# Patient Record
Sex: Female | Born: 1985 | Race: White | Hispanic: No | Smoking: Never smoker
Health system: Southern US, Community
[De-identification: ages and names within clinical notes are randomized; demographics above are authoritative.]

## PROBLEM LIST (undated history)

## (undated) DIAGNOSIS — K219 Gastro-esophageal reflux disease without esophagitis: Secondary | ICD-10-CM

## (undated) DIAGNOSIS — G35 Multiple sclerosis: Secondary | ICD-10-CM

## (undated) HISTORY — DX: Gastro-esophageal reflux disease without esophagitis: K21.9

## (undated) HISTORY — DX: Multiple sclerosis: G35

---

## 2008-02-14 ENCOUNTER — Encounter: Admission: RE | Admit: 2008-02-14 | Discharge: 2008-02-14 | Payer: Self-pay | Admitting: Internal Medicine

## 2008-04-07 HISTORY — PX: WISDOM TOOTH EXTRACTION: SHX21

## 2008-11-07 ENCOUNTER — Emergency Department (HOSPITAL_COMMUNITY): Admission: EM | Admit: 2008-11-07 | Discharge: 2008-11-07 | Payer: Self-pay | Admitting: Family Medicine

## 2010-04-07 HISTORY — PX: TONSILECTOMY/ADENOIDECTOMY WITH MYRINGOTOMY: SHX6125

## 2010-12-22 ENCOUNTER — Inpatient Hospital Stay (INDEPENDENT_AMBULATORY_CARE_PROVIDER_SITE_OTHER)
Admission: RE | Admit: 2010-12-22 | Discharge: 2010-12-22 | Disposition: A | Discharge status: Home / Self Care | Payer: 59 | Source: Ambulatory Visit | Attending: Family Medicine | Admitting: Family Medicine

## 2010-12-22 DIAGNOSIS — J069 Acute upper respiratory infection, unspecified: Secondary | ICD-10-CM

## 2010-12-22 DIAGNOSIS — N390 Urinary tract infection, site not specified: Secondary | ICD-10-CM

## 2010-12-22 LAB — POCT URINALYSIS DIP (DEVICE)
Bilirubin Urine: NEGATIVE
Ketones, ur: NEGATIVE mg/dL
Nitrite: NEGATIVE
Protein, ur: NEGATIVE mg/dL
Urobilinogen, UA: 0.2 mg/dL (ref 0.0–1.0)
pH: 6 (ref 5.0–8.0)

## 2010-12-22 LAB — POCT PREGNANCY, URINE: Preg Test, Ur: NEGATIVE

## 2011-04-03 ENCOUNTER — Ambulatory Visit (INDEPENDENT_AMBULATORY_CARE_PROVIDER_SITE_OTHER): Payer: 59

## 2011-04-03 DIAGNOSIS — J45909 Unspecified asthma, uncomplicated: Secondary | ICD-10-CM

## 2011-04-03 DIAGNOSIS — R5381 Other malaise: Secondary | ICD-10-CM

## 2011-07-08 ENCOUNTER — Ambulatory Visit (INDEPENDENT_AMBULATORY_CARE_PROVIDER_SITE_OTHER): Payer: 59 | Admitting: Physician Assistant

## 2011-07-08 VITALS — BP 128/82 | HR 106 | Temp 99.0°F | Resp 16 | Ht 66.0 in | Wt 117.0 lb

## 2011-07-08 DIAGNOSIS — J45909 Unspecified asthma, uncomplicated: Secondary | ICD-10-CM | POA: Insufficient documentation

## 2011-07-08 DIAGNOSIS — R05 Cough: Secondary | ICD-10-CM

## 2011-07-08 DIAGNOSIS — J019 Acute sinusitis, unspecified: Secondary | ICD-10-CM

## 2011-07-08 MED ORDER — CEFDINIR 300 MG PO CAPS: 300.0000 mg | ORAL_CAPSULE | Freq: Two times a day (BID) | ORAL | Status: AC

## 2011-07-08 MED ORDER — FLUTICASONE PROPIONATE 50 MCG/ACT NA SUSP: 2.0000 | Freq: Every day | NASAL | Status: DC

## 2011-07-08 MED ORDER — HYDROCODONE-HOMATROPINE 5-1.5 MG/5ML PO SYRP: ORAL_SOLUTION | ORAL | Status: AC

## 2011-07-08 NOTE — Progress Notes (Signed)
Patient ID: Penny Hansen MRN: [id number], DOB: Sep 24, 1985, 26 y.o. Date of Encounter: 07/08/2011, 6:55 PM  Primary Physician: No primary provider on file.  Chief Complaint:  Chief Complaint  Patient presents with  . Cough    x 6 days  . Sore Throat  . Sinusitis    HPI: 26 y.o. year old female presents with a 6 day history of worsening nasal congestion, post nasal drip, sore throat, sinus pressure, and cough. Afebrile. No chills. Nasal congestion thick and green/yellow. Sinus pressure is the worst symptom. Cough is productive secondary to post nasal drip and not associated with time of day. She does have a history of asthma, but this has not been an issue with this illness at this point. She denies any chest congestion, SOB, or wheezing. She does not need a refill of her albuterol inhaler today. Her asthma is typically very well controlled not requiring the usage of her albuterol inhaler. Through out this illness she has only required the usage of her albuterol inhaler a couple of times after developing a coughing episode. It was not being used for SOB or wheezing. Ears feel full, leading to sensation of muffled hearing. Has tried OTC cold preps without success. No GI complaints. Appetite normal.  No recent antibiotics, recent travels, or sick contacts   No leg trauma, sedentary periods, h/o cancer, or tobacco use.  Past Medical History  Diagnosis Date  . Asthma      Home Meds: Prior to Admission medications   Medication Sig Start Date End Date Taking? Authorizing Provider  albuterol (PROVENTIL HFA;VENTOLIN HFA) 108 (90 BASE) MCG/ACT inhaler Inhale 2 puffs into the lungs every 6 (six) hours as needed.   Yes Historical Provider, MD  norethindrone-ethinyl estradiol (MICROGESTIN,JUNEL,LOESTRIN) 1-20 MG-MCG tablet Take 1 tablet by mouth daily.   Yes Historical Provider, MD  cefdinir (OMNICEF) 300 MG capsule Take 1 capsule (300 mg total) by mouth 2 (two) times daily. 07/08/11 07/18/11  Keymarion Bearman  M Jensine Luz, PA-C  fluticasone (FLONASE) 50 MCG/ACT nasal spray Place 2 sprays into the nose daily. 07/08/11 07/07/12  Frankie Scipio M Morgaine Kimball, PA-C  HYDROcodone-homatropine (HYCODAN) 5-1.5 MG/5ML syrup 1 TSP PO Q 4-6 HOURS PRN COUGH 07/08/11 07/18/11  Sondra Barges, PA-C    Allergies:  Allergies  Allergen Reactions  . Augmentin Other (See Comments)    Gi upset    History   Social History  . Marital Status: Unknown    Spouse Name: N/A    Number of Children: N/A  . Years of Education: N/A   Occupational History  . Not on file.   Social History Main Topics  . Smoking status: Never Smoker   . Smokeless tobacco: Not on file  . Alcohol Use: Not on file  . Drug Use: Not on file  . Sexually Active: Not on file   Other Topics Concern  . Not on file   Social History Narrative  . No narrative on file     Review of Systems: Constitutional: negative for chills, fever, night sweats or weight changes Cardiovascular: negative for chest pain or palpitations Respiratory: negative for hemoptysis, wheezing, or shortness of breath Abdominal: negative for abdominal pain, nausea, vomiting or diarrhea Dermatological: negative for rash Neurologic: negative for headache   Physical Exam: Blood pressure 128/82, pulse 106, temperature 99 F (37.2 C), temperature source Oral, resp. rate 16, height 5\' 6"  (1.676 m), weight 117 lb (53.071 kg), last menstrual period 06/06/2011., Body mass index is 18.88 kg/(m^2). General: Well developed,  well nourished, in no acute distress. Head: Normocephalic, atraumatic, eyes without discharge, sclera non-icteric, nares are congested. Bilateral auditory canals clear, TM's are without perforation, pearly grey with reflective cone of light bilaterally. Serous effusion bilaterally behind TM's. Maxillary sinus TTP. Oral cavity moist, dentition normal. Posterior pharynx with post nasal drip and mild erythema. No peritonsillar abscess or tonsillar exudate. Neck: Supple. No thyromegaly. Full  ROM. No lymphadenopathy. Lungs: Clear bilaterally to auscultation without wheezes, rales, or rhonchi. Breathing is unlabored.  Heart: RRR with S1 S2. No murmurs, rubs, or gallops appreciated. Msk:  Strength and tone normal for age. Extremities: No clubbing or cyanosis. No edema. Neuro: Alert and oriented X 3. Moves all extremities spontaneously. CNII-XII grossly in tact. Psych:  Responds to questions appropriately with a normal affect.     ASSESSMENT AND PLAN:  26 y.o. year old female with sinusitis -Omnicef 300 mg 1 po bid #20 no RF  -Flonase 2 sprays each nare daily #1 no RF -Hycodan #4oz 1 tsp po q 4-6 hours prn cough no RF SED -Call when needs a refill of her albuterol inhaler -Mucinex -Tylenol/Motrin prn -Rest/fluids -RTC precautions -RTC 3-5 days if no improvement  Elinor Dodge, PA-C 07/08/2011 6:55 PM

## 2011-11-07 ENCOUNTER — Ambulatory Visit (INDEPENDENT_AMBULATORY_CARE_PROVIDER_SITE_OTHER): Payer: 59 | Admitting: Physician Assistant

## 2011-11-07 VITALS — BP 110/62 | HR 91 | Temp 98.4°F | Resp 16 | Ht 66.0 in | Wt 117.8 lb

## 2011-11-07 DIAGNOSIS — R300 Dysuria: Secondary | ICD-10-CM

## 2011-11-07 DIAGNOSIS — N390 Urinary tract infection, site not specified: Secondary | ICD-10-CM

## 2011-11-07 LAB — POCT UA - MICROSCOPIC ONLY
Casts, Ur, LPF, POC: NEGATIVE
Crystals, Ur, HPF, POC: NEGATIVE
Mucus, UA: NEGATIVE
Yeast, UA: NEGATIVE

## 2011-11-07 LAB — POCT URINALYSIS DIPSTICK
Bilirubin, UA: NEGATIVE
Glucose, UA: NEGATIVE
Ketones, UA: NEGATIVE
Nitrite, UA: NEGATIVE
Protein, UA: NEGATIVE
Spec Grav, UA: <=1.005
Urobilinogen, UA: 0.2
pH, UA: 6

## 2011-11-07 MED ORDER — PHENAZOPYRIDINE HCL 200 MG PO TABS: 200.0000 mg | ORAL_TABLET | Freq: Three times a day (TID) | ORAL | Status: AC | PRN

## 2011-11-07 MED ORDER — FLUCONAZOLE 150 MG PO TABS: 150.0000 mg | ORAL_TABLET | Freq: Once | ORAL | Status: AC

## 2011-11-07 MED ORDER — SULFAMETHOXAZOLE-TMP DS 800-160 MG PO TABS: 1.0000 | ORAL_TABLET | Freq: Two times a day (BID) | ORAL | Status: AC

## 2011-11-07 NOTE — Progress Notes (Signed)
  Subjective:    Patient ID: Penny Hansen, female    DOB: 1985-10-18, 26 y.o.   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  Just returned from a 10 hr trip a couple of days ago.  +frequency.  No N/V/D. No abdominal/pelvic pain.  No vaginal discharge.  Its been a while since having a UTI. No f/c.  Denies STI risk factors.  Review of Systems  All other systems reviewed and are negative.       Objective:   Physical Exam  Nursing note and vitals reviewed. Constitutional: She is oriented to person, place, and time. She appears well-developed and well-nourished.  HENT:  Head: Normocephalic and atraumatic.  Cardiovascular: Normal rate, regular rhythm and normal heart sounds.   Pulmonary/Chest: Effort normal and breath sounds normal.  Abdominal:       No CVA tenderness  Neurological: She is alert and oriented to person, place, and time.  Skin: Skin is warm and dry.     Results for orders placed in visit on 11/07/11  POCT UA - MICROSCOPIC ONLY      Component Value Range   WBC, Ur, HPF, POC 10-26     RBC, urine, microscopic 8-12     Bacteria, U Microscopic trace     Mucus, UA negative     Epithelial cells, urine per micros 0-1     Crystals, Ur, HPF, POC negative     Casts, Ur, LPF, POC negative     Yeast, UA negative    POCT URINALYSIS DIPSTICK      Component Value Range   Color, UA yellow     Clarity, UA slightly cloudy     Glucose, UA negative     Bilirubin, UA negative     Ketones, UA negative     Spec Grav, UA <=1.005     Blood, UA large     pH, UA 6.0     Protein, UA negative     Urobilinogen, UA 0.2     Nitrite, UA negative     Leukocytes, UA small (1+)          Assessment & Plan:  UTI-see Rxs.  Increase water intake.  Sending urine for culture.

## 2011-11-10 LAB — URINE CULTURE: Colony Count: >=100000

## 2012-05-18 ENCOUNTER — Ambulatory Visit (INDEPENDENT_AMBULATORY_CARE_PROVIDER_SITE_OTHER): Payer: 59 | Admitting: Physician Assistant

## 2012-05-18 VITALS — BP 112/71 | HR 80 | Temp 98.1°F | Resp 16 | Ht 67.0 in | Wt 115.0 lb

## 2012-05-18 DIAGNOSIS — R300 Dysuria: Secondary | ICD-10-CM

## 2012-05-18 DIAGNOSIS — N390 Urinary tract infection, site not specified: Secondary | ICD-10-CM

## 2012-05-18 LAB — POCT URINALYSIS DIPSTICK
Bilirubin, UA: NEGATIVE
Glucose, UA: NEGATIVE
Ketones, UA: NEGATIVE
Protein, UA: NEGATIVE
Spec Grav, UA: <=1.005
Urobilinogen, UA: 0.2
pH, UA: 5.5

## 2012-05-18 LAB — POCT UA - MICROSCOPIC ONLY
Casts, Ur, LPF, POC: NEGATIVE
Epithelial cells, urine per micros: NEGATIVE
Mucus, UA: NEGATIVE
Yeast, UA: NEGATIVE

## 2012-05-18 MED ORDER — NITROFURANTOIN MONOHYD MACRO 100 MG PO CAPS: 100.0000 mg | ORAL_CAPSULE | Freq: Two times a day (BID) | ORAL | Status: DC

## 2012-05-18 MED ORDER — PHENAZOPYRIDINE HCL 200 MG PO TABS: 200.0000 mg | ORAL_TABLET | Freq: Three times a day (TID) | ORAL | Status: DC | PRN

## 2012-05-18 NOTE — Progress Notes (Signed)
   [address], [place] [address]   Phone [phone]  Subjective:    Patient ID: Penny Hansen, female    DOB: 1985-10-05, 27 y.o.   MRN: [id number]  HPI Pt presents to clinic with 6 hr h/o urinary urgency and frequency with dysuria .  Some abd pressure but no back pain, fevers or chill.  No change in sexual partners.  She works at the hospital in [employer] and knows she does not drink enough water.     Review of Systems  Constitutional: Negative for fever and chills.  Gastrointestinal: Negative for nausea, vomiting and abdominal pain.  Genitourinary: Positive for dysuria, urgency, frequency and hematuria. Negative for vaginal discharge.  Musculoskeletal: Negative for back pain.       Objective:   Physical Exam  Vitals reviewed. Constitutional: She is oriented to person, place, and time. She appears well-developed and well-nourished.  HENT:  Head: Normocephalic and atraumatic.  Right Ear: External ear normal.  Left Ear: External ear normal.  Eyes: Conjunctivae are normal.  Cardiovascular: Normal rate, regular rhythm and normal heart sounds.   Pulmonary/Chest: Effort normal and breath sounds normal.  Abdominal: Soft. Bowel sounds are normal. There is tenderness (mild suprapubic discomfort). There is no CVA tenderness.  Neurological: She is alert and oriented to person, place, and time.  Skin: Skin is warm and dry.  Psychiatric: She has a normal mood and affect. Her behavior is normal. Judgment and thought content normal.    Results for orders placed in visit on 05/18/12  POCT UA - MICROSCOPIC ONLY      Result Value Range   WBC, Ur, HPF, POC 30-40 with small clusters     RBC, urine, microscopic 0-2     Bacteria, U Microscopic trace     Mucus, UA neg     Epithelial cells, urine per micros neg     Crystals, Ur, HPF, POC neg     Casts, Ur, LPF, POC neg     Yeast, UA neg    POCT URINALYSIS DIPSTICK      Result Value Range   Color, UA yellowish pink     Clarity, UA hazy       Glucose, UA neg     Bilirubin, UA neg     Ketones, UA neg     Spec Grav, UA <=1.005     Blood, UA large     pH, UA 5.5     Protein, UA neg     Urobilinogen, UA 0.2     Nitrite, UA neg     Leukocytes, UA moderate (2+)           Assessment & Plan:   1. Dysuria  POCT UA - Microscopic Only   POCT UA - Microscopic Only   POCT urinalysis dipstick  2. Urinary tract infection, site not specified  phenazopyridine (PYRIDIUM) 200 MG tablet   phenazopyridine (PYRIDIUM) 200 MG tablet   Urine culture   nitrofurantoin, macrocrystal-monohydrate, (MACROBID) 100 MG capsule   Push fluids.  Continue cranberry supplements because she feels like they are helping decrease the frequency of her UTI.

## 2012-05-20 LAB — URINE CULTURE: Colony Count: 4000

## 2012-08-05 ENCOUNTER — Ambulatory Visit (INDEPENDENT_AMBULATORY_CARE_PROVIDER_SITE_OTHER): Payer: 59 | Admitting: Physician Assistant

## 2012-08-05 VITALS — BP 98/62 | HR 83 | Temp 98.5°F | Resp 16 | Ht 67.0 in | Wt 115.0 lb

## 2012-08-05 DIAGNOSIS — J069 Acute upper respiratory infection, unspecified: Secondary | ICD-10-CM

## 2012-08-05 NOTE — Progress Notes (Signed)
   [address] [place] [address]   Phone [phone]  Subjective:    Patient ID: Penny Hansen, female    DOB: 1985-05-24, 27 y.o.   MRN: [id number]  HPI Pt presents to clinic with 4 day h/o URI symptoms.  She started with a tickle in her throat and then the cough started and now today she feels slightly congested.  She has little rhinorhea and some PND.  She has tried some OTC meds but they have made her feel bad.  She has some fatigue but just does not feel good.  She has never had problems with allergies in the past.  She is an Engineer, structural in the ED.  She is having no problems with her asthma.  OTC - mucinex - gets her nauseated and decrease, cold preps -not much help Review of Systems  Constitutional: Negative for fever and chills.  HENT: Positive for congestion (worse in the am and pm), sore throat (started with a tickle and then a sore throat), rhinorrhea (yellow) and postnasal drip.   Respiratory: Positive for cough (yellow).   Allergic/Immunologic: Negative for environmental allergies.       Objective:   Physical Exam  Vitals reviewed. Constitutional: She is oriented to person, place, and time. She appears well-developed and well-nourished.  HENT:  Head: Normocephalic and atraumatic.  Right Ear: Hearing, tympanic membrane, external ear and ear canal normal.  Left Ear: Hearing, tympanic membrane, external ear and ear canal normal.  Nose: Mucosal edema (red) present.  Mouth/Throat: Uvula is midline, oropharynx is clear and moist and mucous membranes are normal.  Eyes: Conjunctivae are normal. Pupils are equal, round, and reactive to light.  Neck: Neck supple.  Cardiovascular: Normal rate, regular rhythm and normal heart sounds.   No murmur heard. Pulmonary/Chest: Effort normal and breath sounds normal. She has no wheezes.  Neurological: She is alert and oriented to person, place, and time.  Skin: Skin is warm and dry.  Psychiatric: She has a normal mood and affect. Her  behavior is normal. Judgment and thought content normal.          Assessment & Plan:  Acute upper respiratory infections of unspecified site - pt to use the Flonase she has at home.  She is to take immediate release Mucinex to see if that will decrease her nausea. She will push fluids and call me in 5-7 days if no better.  Benny Lennert PA-C 08/05/2012 4:45 PM

## 2012-08-09 ENCOUNTER — Telehealth: Payer: Self-pay

## 2012-08-09 NOTE — Telephone Encounter (Signed)
PT STATES SARAH SAID IF SHE WASN'T ANY BETTER, SHE WOULD CALL HER SOMETHING IN AND SHE WOULD LIKE HER TO DO SO ALSO IS OUT OF HER ALBUTEROL AND NEED A REFILL ON THAT PLEASE CALL [phone]   [facility]

## 2012-08-10 MED ORDER — DOXYCYCLINE HYCLATE 100 MG PO TABS: 100.0000 mg | ORAL_TABLET | Freq: Two times a day (BID) | ORAL | Status: DC

## 2012-08-10 MED ORDER — ALBUTEROL SULFATE HFA 108 (90 BASE) MCG/ACT IN AERS: 2.0000 | INHALATION_SPRAY | Freq: Four times a day (QID) | RESPIRATORY_TRACT | Status: DC | PRN

## 2012-08-10 NOTE — Telephone Encounter (Signed)
I have sent the inhaler to the pharmacy - I have sent in an abx for her that will cover sinus and lungs.

## 2012-08-11 NOTE — Telephone Encounter (Signed)
Called her to advise.  

## 2012-08-23 ENCOUNTER — Encounter: Payer: Self-pay | Admitting: Emergency Medicine

## 2013-02-10 ENCOUNTER — Other Ambulatory Visit: Payer: Self-pay

## 2013-03-27 ENCOUNTER — Emergency Department (HOSPITAL_COMMUNITY)
Admission: EM | Admit: 2013-03-27 | Discharge: 2013-03-27 | Discharge status: Absent without leave | Payer: Self-pay | Source: Home / Self Care

## 2013-03-27 ENCOUNTER — Emergency Department (HOSPITAL_COMMUNITY)
Admission: EM | Admit: 2013-03-27 | Discharge: 2013-03-27 | Disposition: A | Discharge status: Home / Self Care | Payer: 59 | Source: Home / Self Care | Attending: Family Medicine | Admitting: Family Medicine

## 2013-03-27 ENCOUNTER — Encounter (HOSPITAL_COMMUNITY): Payer: Self-pay | Admitting: Emergency Medicine

## 2013-03-27 DIAGNOSIS — R300 Dysuria: Secondary | ICD-10-CM

## 2013-03-27 LAB — POCT URINALYSIS DIP (DEVICE)
Glucose, UA: 100 mg/dL — AB
Hgb urine dipstick: NEGATIVE
Urobilinogen, UA: 1 mg/dL (ref 0.0–1.0)
pH: 7.5 (ref 5.0–8.0)

## 2013-03-27 MED ORDER — SULFAMETHOXAZOLE-TRIMETHOPRIM 800-160 MG PO TABS: 1.0000 | ORAL_TABLET | Freq: Two times a day (BID) | ORAL | Status: DC

## 2013-03-27 NOTE — ED Notes (Signed)
C/o UTi for a day now States she does have burning when urinating, chills and urinary frequency States she has tried AZO but no relief.

## 2013-03-27 NOTE — ED Provider Notes (Signed)
CSN: [id number]     Arrival date & time 03/27/13  1634 History   First MD Initiated Contact with Patient 03/27/13 1802     Chief Complaint  Patient presents with  . Urinary Tract Infection   (Consider location/radiation/quality/duration/timing/severity/associated sxs/prior Treatment) HPI Comments: 24 hours of dysuria, chills and frequency. No hematuria, flank pain, N/V or abdominal pain. No vaginal bleeding or discharge.   Patient is a 27 y.o. female presenting with urinary tract infection. The history is provided by the patient.  Urinary Tract Infection This is a new problem. The current episode started yesterday. The problem has been gradually worsening.    Past Medical History  Diagnosis Date  . Asthma    Past Surgical History  Procedure Laterality Date  . Tonsilectomy/adenoidectomy with myringotomy    . Wisdom tooth extraction     Family History  Problem Relation Age of Onset  . Ulcerative colitis Mother   . Cancer Paternal Grandmother    History  Substance Use Topics  . Smoking status: Never Smoker   . Smokeless tobacco: Not on file  . Alcohol Use: No   OB History   Grav Para Term Preterm Abortions TAB SAB Ect Mult Living                 Review of Systems  All other systems reviewed and are negative.    Allergies  Amoxicillin-pot clavulanate  Home Medications   Current Outpatient Rx  Name  Route  Sig  Dispense  Refill  . albuterol (PROVENTIL HFA;VENTOLIN HFA) 108 (90 BASE) MCG/ACT inhaler   Inhalation   Inhale 2 puffs into the lungs every 6 (six) hours as needed.   1 Inhaler   0   . Ascorbic Acid (VITAMIN C) 100 MG tablet   Oral   Take 100 mg by mouth daily.         Marland Kitchen doxycycline (VIBRA-TABS) 100 MG tablet   Oral   Take 1 tablet (100 mg total) by mouth 2 (two) times daily.   20 tablet   0   . norethindrone-ethinyl estradiol (MICROGESTIN,JUNEL,LOESTRIN) 1-20 MG-MCG tablet   Oral   Take 1 tablet by mouth daily.          BP 128/72   Pulse 81  Temp(Src) 98 F (36.7 C) (Oral)  Resp 18  SpO2 96%  LMP 03/17/2013 Physical Exam  Nursing note and vitals reviewed. Constitutional: She is oriented to person, place, and time. She appears well-developed and well-nourished. No distress.  HENT:  Head: Normocephalic and atraumatic.  Eyes: No scleral icterus.  Cardiovascular: Normal rate, regular rhythm and normal heart sounds.   Pulmonary/Chest: Effort normal and breath sounds normal.  Abdominal: Soft. Bowel sounds are normal. She exhibits no distension. There is no tenderness.  Musculoskeletal: Normal range of motion.  Neurological: She is alert and oriented to person, place, and time.  Skin: Skin is warm and dry.  Psychiatric: She has a normal mood and affect. Her behavior is normal.    ED Course  Procedures (including critical care time) Labs Review Labs Reviewed - No data to display Imaging Review No results found.  EKG Interpretation    Date/Time:    Ventricular Rate:    PR Interval:    QRS Duration:   QT Interval:    QTC Calculation:   R Axis:     Text Interpretation:              MDM  CBG normal (83). UA only with small  LE, small glucose but no nitrite. Will send for C&S. Advised patient that dipstick test with very limited evidence for UTI, however, based upon patient's clinical symptoms and approaching Christmas Holiday, will prove with 3 day course of Bactrim DS that she may begin if symptoms worse over next few days.   Jess Barters [place], [place] 03/27/13 [phone]

## 2013-03-28 NOTE — ED Provider Notes (Signed)
Medical screening examination/treatment/procedure(s) were performed by a resident physician or non-physician practitioner and as the supervising physician I was immediately available for consultation/collaboration.  Kristofor Michalowski, MD    Hernandez Losasso S Raidyn Wassink, MD 03/28/13 0741 

## 2013-03-29 LAB — URINE CULTURE
Colony Count: 25000
Special Requests: NORMAL

## 2013-03-29 NOTE — ED Notes (Signed)
Urine culture: 25,000 colonies E. Coli.  Pt. adequately treated with Septra DS. Vassie Moselle 03/29/2013

## 2013-04-13 ENCOUNTER — Encounter: Payer: Self-pay | Admitting: Family Medicine

## 2013-04-13 ENCOUNTER — Ambulatory Visit (INDEPENDENT_AMBULATORY_CARE_PROVIDER_SITE_OTHER): Payer: 59 | Admitting: Family Medicine

## 2013-04-13 VITALS — BP 114/79 | HR 96 | Temp 98.5°F | Ht 67.0 in | Wt 124.0 lb

## 2013-04-13 DIAGNOSIS — Z0000 Encounter for general adult medical examination without abnormal findings: Secondary | ICD-10-CM

## 2013-04-13 NOTE — Patient Instructions (Signed)
It was great meeting you today!  Please return to the clinic for a lab appointment only. You can make the appointment by calling a couple of days prior to the day you would like to get your labs drawn.  Please come fasting for the labwork: no food or drink other than water after midnight.  I will send you a letter in the mail with the results of your lab work.

## 2013-04-13 NOTE — Progress Notes (Signed)
Patient ID: Penny Hansen, female   DOB: 02/07/1986, 28 y.o.   MRN: [id number]  Chief Complaint  Patient presents with  . new patient     HPI Penny Hansen is a 28 y.o. female who presents to establish care. She has no complaints or concerns. Past Medical History  Diagnosis Date  . Asthma   . GERD (gastroesophageal reflux disease)     silent GERD which has caused cough in the past    Past Surgical History  Procedure Laterality Date  . Tonsilectomy/adenoidectomy with myringotomy    . Wisdom tooth extraction      Family History  Problem Relation Age of Onset  . Ulcerative colitis Mother   . Cancer Paternal Grandmother [id number]    breast cancer with mastectomy, chemo and radiation  . Diabetes Paternal Grandmother   . Cancer Maternal Grandmother [id number]    breast cancer: lumpectomy  . Heart disease Maternal Grandfather     MI    Social History History  Substance Use Topics  . Smoking status: Never Smoker   . Smokeless tobacco: Not on file  . Alcohol Use: No    Allergies  Allergen Reactions  . Amoxicillin-Pot Clavulanate Other (See Comments)    Gi upset    Current Outpatient Prescriptions  Medication Sig Dispense Refill  . albuterol (PROVENTIL HFA;VENTOLIN HFA) 108 (90 BASE) MCG/ACT inhaler Inhale 2 puffs into the lungs every 6 (six) hours as needed.  1 Inhaler  0  . Ascorbic Acid (VITAMIN C) 100 MG tablet Take 100 mg by mouth daily.      . norethindrone-ethinyl estradiol (MICROGESTIN,JUNEL,LOESTRIN) 1-20 MG-MCG tablet Take 1 tablet by mouth daily.       No current facility-administered medications for this visit.    Review of Systems Review of Systems  Constitutional: Negative for chills, activity change and fatigue.  Respiratory: Negative for cough, shortness of breath and wheezing.   Cardiovascular: Negative for chest pain and leg swelling.  Gastrointestinal: Negative for nausea, abdominal pain, constipation and blood in stool.  Genitourinary: Negative  for frequency.  Musculoskeletal: Negative for arthralgias.  Skin: Negative for color change and rash.  Neurological: Negative for light-headedness and headaches.  Psychiatric/Behavioral: Negative for suicidal ideas, behavioral problems and sleep disturbance.    Blood pressure 114/79, pulse 96, temperature 98.5 F (36.9 C), temperature source Oral, height 5\' 7"  (1.702 m), weight 124 lb (56.246 kg), last menstrual period 03/17/2013.  Physical Exam Physical Exam General appearance - alert, well appearing, and in no distress, pleasant white female Ears - bilateral TM's and external ear canals normal Nose - normal and patent, no erythema, discharge or polyps Mouth - mucous membranes moist, pharynx normal without lesions, tonsils absent Neck - supple, no significant adenopathy Chest - clear to auscultation, no wheezes, rales or rhonchi, symmetric air entry Heart - normal rate, regular rhythm, normal S1, S2, no murmurs Abdomen - soft, nontender, nondistended, no masses or organomegaly Extremities - no edema  Data Reviewed Reported PAP smear results by patient:  2013: [facility] pap with no high risk HPV  Assessment    28 yo female with no significant past medical history who presents to establish care. Doing well.     Plan    Health maintenance:  - will obtain lipid panel, CMP and CBC as baseline labs since this has never been done - repeat PAP smear to be done at her gynecologist's office: [facility]. She was told to have repeat PAP from [facility] PAP without high risk  HPV in 1 year which will be in February 2015.  - up todate on flu shot.   Follow up in 1 year or as needed.      Marena Chancy 04/13/2013, 5:26 PM

## 2013-08-17 ENCOUNTER — Encounter: Payer: Self-pay | Admitting: Family Medicine

## 2013-08-17 ENCOUNTER — Ambulatory Visit (INDEPENDENT_AMBULATORY_CARE_PROVIDER_SITE_OTHER): Payer: 59 | Admitting: Family Medicine

## 2013-08-17 VITALS — BP 121/55 | HR 98 | Temp 99.1°F | Resp 20 | Ht 67.0 in | Wt 120.0 lb

## 2013-08-17 DIAGNOSIS — J329 Chronic sinusitis, unspecified: Secondary | ICD-10-CM

## 2013-08-17 MED ORDER — AZITHROMYCIN 500 MG PO TABS: 500.0000 mg | ORAL_TABLET | Freq: Every day | ORAL | Status: DC

## 2013-08-17 MED ORDER — FLUTICASONE PROPIONATE 50 MCG/ACT NA SUSP
2.0000 | Freq: Every day | NASAL | Status: DC
Start: 2013-08-17 — End: 2014-01-21

## 2013-08-17 NOTE — Progress Notes (Signed)
Family Medicine Office Visit Note   Subjective:   Patient ID: Penny Hansen, female  DOB: [birth year], 28 y.o.. MRN: [id number]   Pt that comes today complaining of respiratory symptoms for more than one week. She reports nasal congestion and discharge as well as changes in her sense of smell. Denies fever, chills, nausea or vomiting. She has history of sick contact with her husband who is also sick with similar symptoms.   Review of Systems:  Per HPI  Objective:   Physical Exam: Gen:  NAD HEENT: Moist mucous membranes. Oropharynx normal without exudates. Nose: no rhinorrhea but hyponasal voice is present. Sinus not tender to palpation.  Ears: normal ear canal and TM with mild clear effusion present bilaterally. R more than left. Neck is supple, no adenopathies.  CV: Regular rate and rhythm, no murmurs rubs or gallops PULM: Clear to auscultation bilaterally. No wheezes/rales/rhonchi EXT: No edema Neuro: Alert and oriented x3. No focalization  Assessment & Plan:

## 2013-08-17 NOTE — Assessment & Plan Note (Signed)
Symptoms for more than 1 week. Will treat with Azithromycin. Ear effusion present without signs of otitis media, most likely from congestion and allergies. Recommend Flonase and oral antihistaminic F/u as needed.

## 2013-08-17 NOTE — Patient Instructions (Signed)

## 2013-11-10 ENCOUNTER — Emergency Department (HOSPITAL_COMMUNITY)
Admission: EM | Admit: 2013-11-10 | Discharge: 2013-11-10 | Disposition: A | Discharge status: Home / Self Care | Payer: 59 | Source: Home / Self Care | Attending: Family Medicine | Admitting: Family Medicine

## 2013-11-10 ENCOUNTER — Encounter (HOSPITAL_COMMUNITY): Payer: Self-pay | Admitting: Emergency Medicine

## 2013-11-10 DIAGNOSIS — J40 Bronchitis, not specified as acute or chronic: Secondary | ICD-10-CM

## 2013-11-10 MED ORDER — IPRATROPIUM BROMIDE 0.06 % NA SOLN: 2.0000 | Freq: Four times a day (QID) | NASAL | Status: DC

## 2013-11-10 MED ORDER — GUAIFENESIN-CODEINE 100-10 MG/5ML PO SOLN: 5.0000 mL | Freq: Every evening | ORAL | Status: DC | PRN

## 2013-11-10 MED ORDER — ALBUTEROL SULFATE HFA 108 (90 BASE) MCG/ACT IN AERS
2.0000 | INHALATION_SPRAY | Freq: Four times a day (QID) | RESPIRATORY_TRACT | Status: DC | PRN
Start: 2013-11-10 — End: 2015-12-27

## 2013-11-10 MED ORDER — PREDNISONE 10 MG PO TABS: 30.0000 mg | ORAL_TABLET | Freq: Every day | ORAL | Status: DC

## 2013-11-10 MED ORDER — AZITHROMYCIN 250 MG PO TABS: 250.0000 mg | ORAL_TABLET | Freq: Every day | ORAL | Status: DC

## 2013-11-10 NOTE — ED Provider Notes (Signed)
Penny Hansen is a 28 y.o. female who presents to Urgent Care today for cough. Patient is a ten-day history of cough that is sometimes productive. The cough is quite profound and can interfere with sleep. She has had a few episodes of posttussive emesis. Albuterol and also help. Her albuterol inhalers expired. She does have a history of asthma. She denies any fevers or chills or diarrhea. No chest pain or palpitations.   Past Medical History  Diagnosis Date  . Asthma   . GERD (gastroesophageal reflux disease)     silent GERD which has caused cough in the past   History  Substance Use Topics  . Smoking status: Never Smoker   . Smokeless tobacco: Not on file  . Alcohol Use: No   ROS as above Medications: No current facility-administered medications for this encounter.   Current Outpatient Prescriptions  Medication Sig Dispense Refill  . norethindrone-ethinyl estradiol (MICROGESTIN,JUNEL,LOESTRIN) 1-20 MG-MCG tablet Take 1 tablet by mouth daily.      Marland Kitchen albuterol (PROVENTIL HFA;VENTOLIN HFA) 108 (90 BASE) MCG/ACT inhaler Inhale 2 puffs into the lungs every 6 (six) hours as needed for wheezing or shortness of breath.  1 Inhaler  2  . Ascorbic Acid (VITAMIN C) 100 MG tablet Take 100 mg by mouth daily.      Marland Kitchen azithromycin (ZITHROMAX) 250 MG tablet Take 1 tablet (250 mg total) by mouth daily. Take first 2 tablets together, then 1 every day until finished.  6 tablet  0  . fluticasone (FLONASE) 50 MCG/ACT nasal spray Place 2 sprays into both nostrils daily.  16 g  2  . guaiFENesin-codeine 100-10 MG/5ML syrup Take 5 mLs by mouth at bedtime as needed for cough.  120 mL  0  . ipratropium (ATROVENT) 0.06 % nasal spray Place 2 sprays into both nostrils 4 (four) times daily.  15 mL  1  . predniSONE (DELTASONE) 10 MG tablet Take 3 tablets (30 mg total) by mouth daily.  15 tablet  0    Exam:  BP 128/88  Pulse 71  Temp(Src) 98.5 F (36.9 C) (Oral)  Resp 12  SpO2 100% Gen: Well NAD HEENT:  EOMI,  MMM posterior pharynx with cobblestoning. Tympanic membranes normal appearing bilaterally. Lungs: Normal work of breathing. CTABL Heart: RRR no MRG Abd: NABS, Soft. Nondistended, Nontender Exts: Brisk capillary refill, warm and well perfused.   No results found for this or any previous visit (from the past 24 hour(s)). No results found.  Assessment and Plan: 28 y.o. female with bronchitis. Plan to treat with prednisone albuterol Atrovent nasal spray and codeine containing cough medication. Azithromycin for use if not improved.  Discussed warning signs or symptoms. Please see discharge instructions. Patient expresses understanding.   This note was created using Conservation officer, historic buildings. Any transcription errors are unintended.    Rodolph Bong, MD 11/10/13 [phone]

## 2013-11-10 NOTE — Discharge Instructions (Signed)
Thank you for coming in today. Call or go to the emergency room if you get worse, have trouble breathing, have chest pains, or palpitations.  Take azithromycin antibiotics if not getting better.    Acute Bronchitis Bronchitis is inflammation of the airways that extend from the windpipe into the lungs (bronchi). The inflammation often causes mucus to develop. This leads to a cough, which is the most common symptom of bronchitis.  In acute bronchitis, the condition usually develops suddenly and goes away over time, usually in a couple weeks. Smoking, allergies, and asthma can make bronchitis worse. Repeated episodes of bronchitis may cause further lung problems.  CAUSES Acute bronchitis is most often caused by the same virus that causes a cold. The virus can spread from person to person (contagious) through coughing, sneezing, and touching contaminated objects. SIGNS AND SYMPTOMS   Cough.   Fever.   Coughing up mucus.   Body aches.   Chest congestion.   Chills.   Shortness of breath.   Sore throat.  DIAGNOSIS  Acute bronchitis is usually diagnosed through a physical exam. Your health care provider will also ask you questions about your medical history. Tests, such as chest X-rays, are sometimes done to rule out other conditions.  TREATMENT  Acute bronchitis usually goes away in a couple weeks. Oftentimes, no medical treatment is necessary. Medicines are sometimes given for relief of fever or cough. Antibiotic medicines are usually not needed but may be prescribed in certain situations. In some cases, an inhaler may be recommended to help reduce shortness of breath and control the cough. A cool mist vaporizer may also be used to help thin bronchial secretions and make it easier to clear the chest.  HOME CARE INSTRUCTIONS  Get plenty of rest.   Drink enough fluids to keep your urine clear or pale yellow (unless you have a medical condition that requires fluid restriction).  Increasing fluids may help thin your respiratory secretions (sputum) and reduce chest congestion, and it will prevent dehydration.   Take medicines only as directed by your health care provider.  If you were prescribed an antibiotic medicine, finish it all even if you start to feel better.  Avoid smoking and secondhand smoke. Exposure to cigarette smoke or irritating chemicals will make bronchitis worse. If you are a smoker, consider using nicotine gum or skin patches to help control withdrawal symptoms. Quitting smoking will help your lungs heal faster.   Reduce the chances of another bout of acute bronchitis by washing your hands frequently, avoiding people with cold symptoms, and trying not to touch your hands to your mouth, nose, or eyes.   Keep all follow-up visits as directed by your health care provider.  SEEK MEDICAL CARE IF: Your symptoms do not improve after 1 week of treatment.  SEEK IMMEDIATE MEDICAL CARE IF:  You develop an increased fever or chills.   You have chest pain.   You have severe shortness of breath.  You have bloody sputum.   You develop dehydration.  You faint or repeatedly feel like you are going to pass out.  You develop repeated vomiting.  You develop a severe headache. MAKE SURE YOU:   Understand these instructions.  Will watch your condition.  Will get help right away if you are not doing well or get worse. Document Released: 05/01/2004 Document Revised: 08/08/2013 Document Reviewed: 09/14/2012 [facility] Patient Information 2015 [place], [place]. This information is not intended to replace advice given to you by your health care provider. Make  sure you discuss any questions you have with your health care provider.

## 2013-11-10 NOTE — ED Notes (Signed)
Pt assessed by provider 

## 2013-12-16 ENCOUNTER — Telehealth: Payer: Self-pay | Admitting: Family Medicine

## 2013-12-16 ENCOUNTER — Ambulatory Visit
Admission: RE | Admit: 2013-12-16 | Discharge: 2013-12-16 | Disposition: A | Discharge status: Home / Self Care | Payer: 59 | Source: Ambulatory Visit | Attending: Family Medicine | Admitting: Family Medicine

## 2013-12-16 ENCOUNTER — Encounter: Payer: Self-pay | Admitting: Family Medicine

## 2013-12-16 ENCOUNTER — Ambulatory Visit (INDEPENDENT_AMBULATORY_CARE_PROVIDER_SITE_OTHER): Payer: 59 | Admitting: Family Medicine

## 2013-12-16 VITALS — BP 120/76 | HR 84 | Temp 99.2°F | Wt 122.0 lb

## 2013-12-16 DIAGNOSIS — R05 Cough: Secondary | ICD-10-CM

## 2013-12-16 DIAGNOSIS — R059 Cough, unspecified: Secondary | ICD-10-CM

## 2013-12-16 DIAGNOSIS — R051 Acute cough: Secondary | ICD-10-CM | POA: Insufficient documentation

## 2013-12-16 IMAGING — CR DG CHEST 2V
2 series · 2 of 2 positions shown · non-contrast
Comparison: None.

CLINICAL DATA: Cough.  Right chest discomfort.  Fatigue.

EXAM:
CHEST  2 VIEW

[w chest pa]
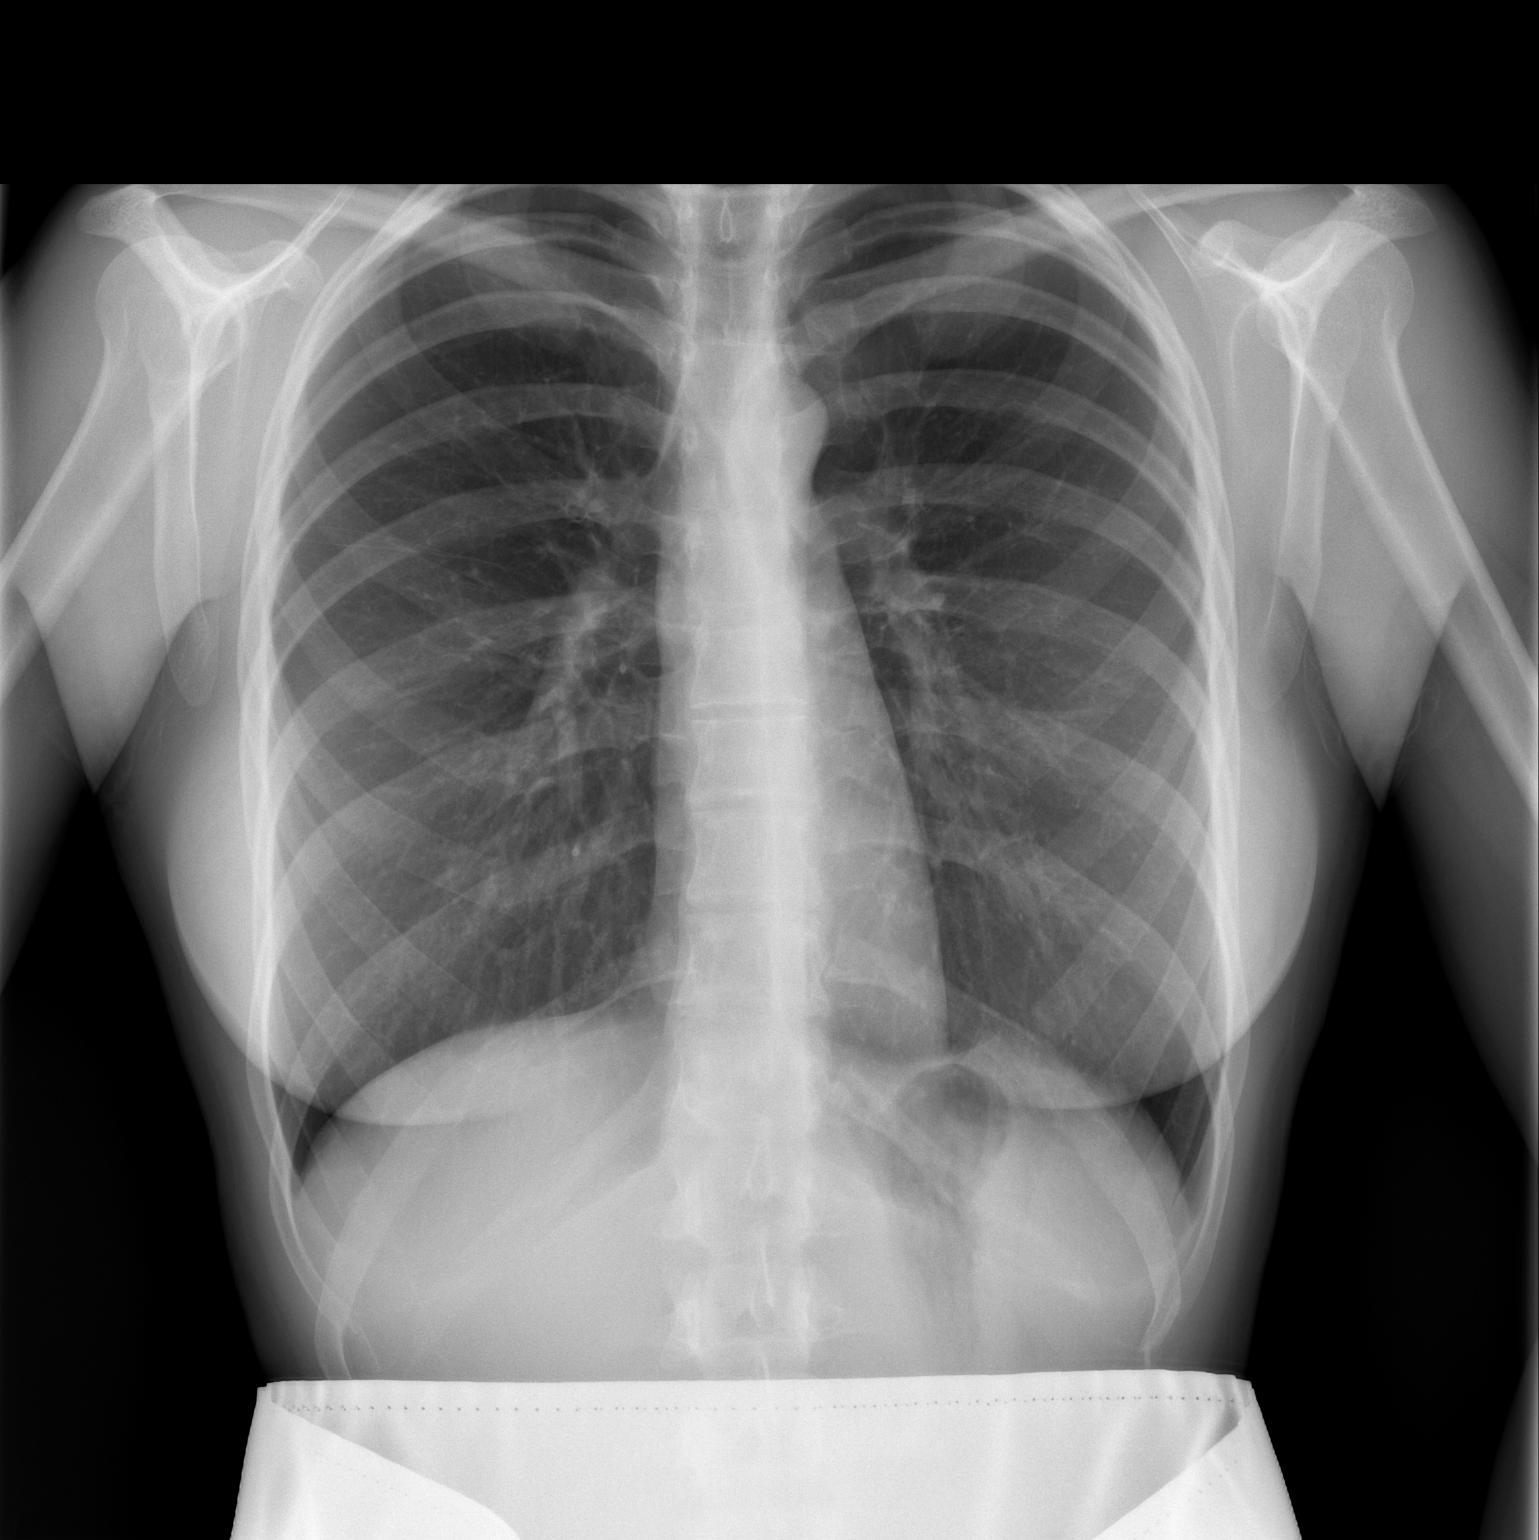

[w chest lat]
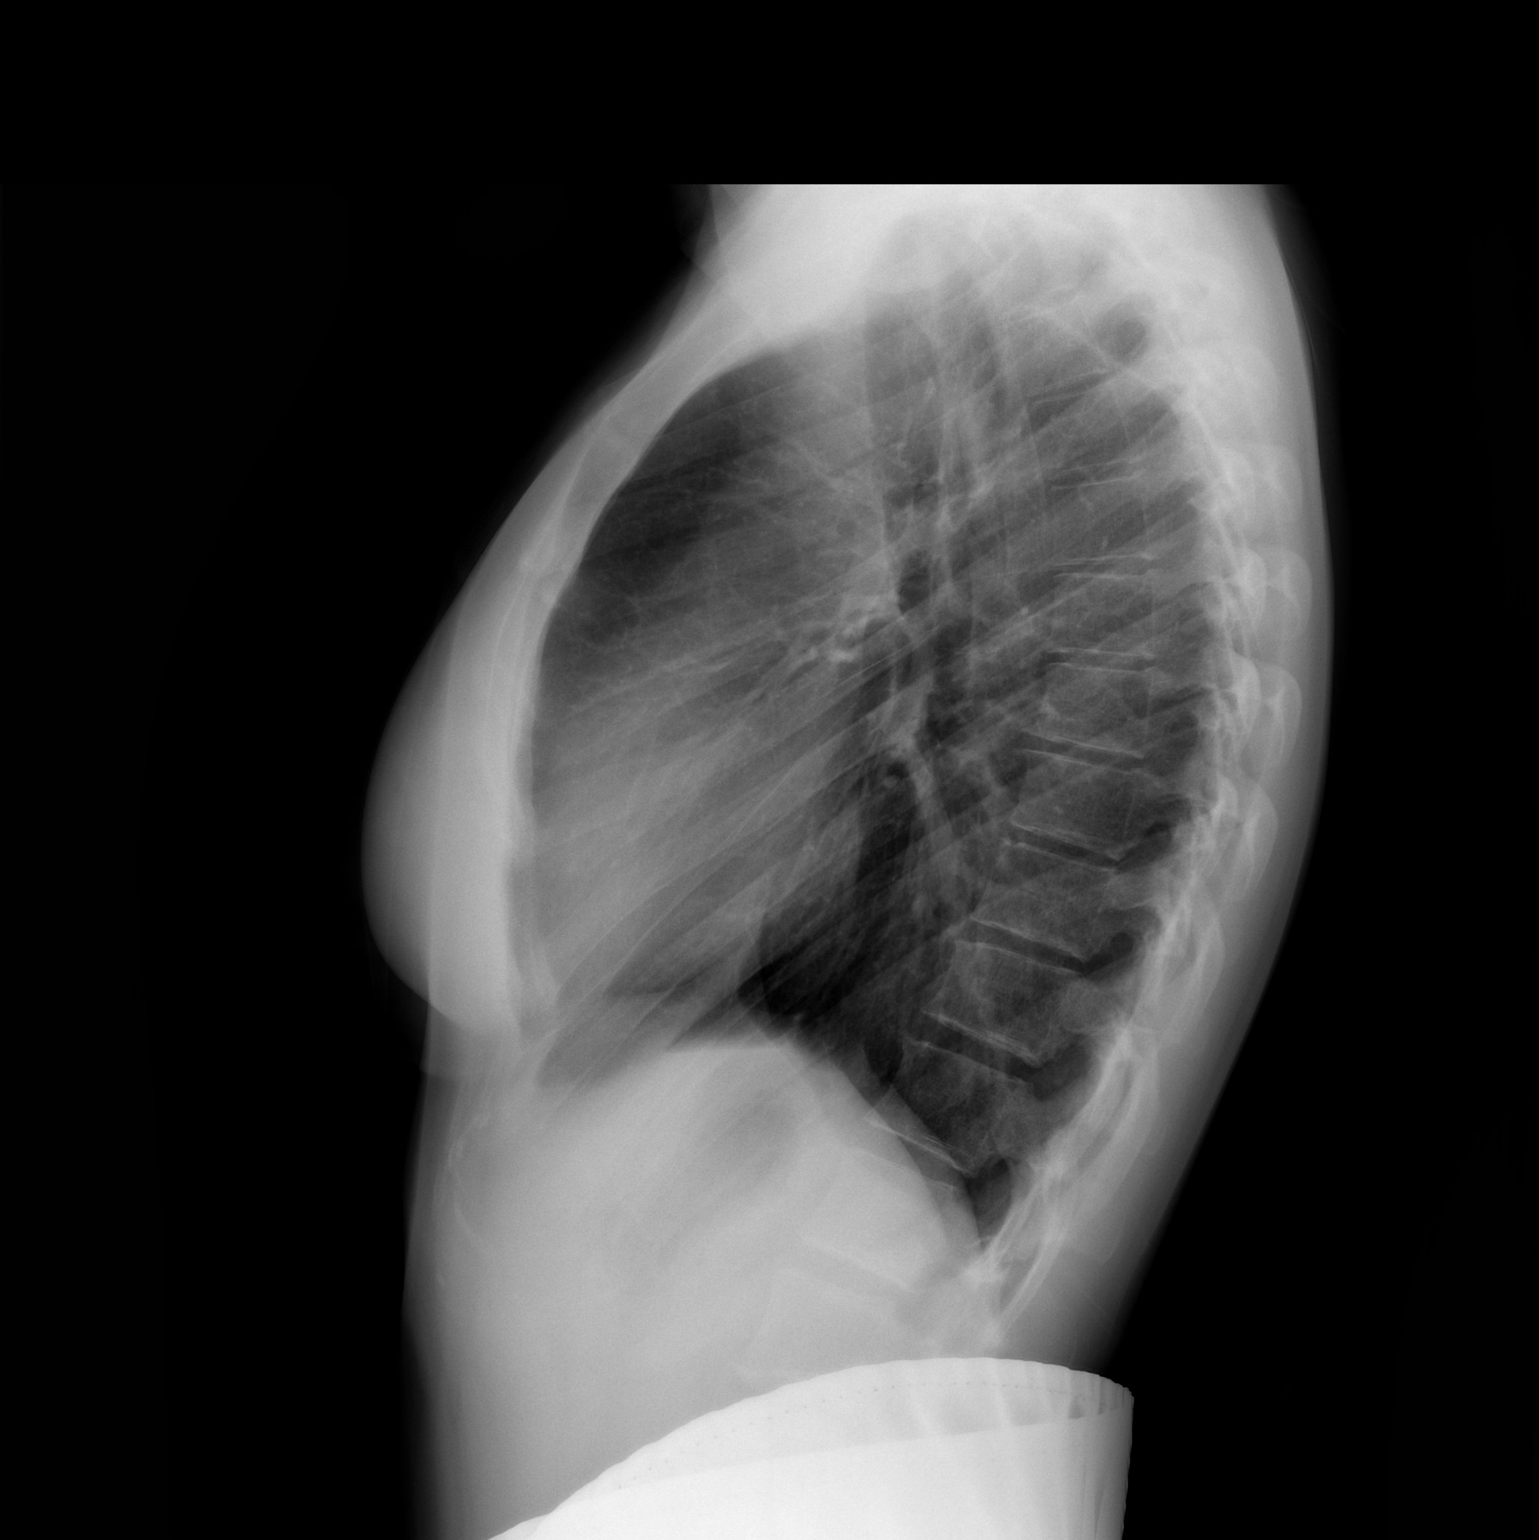

[2 of 2 positions shown; findings below may reference images not displayed]

FINDINGS: The heart size and mediastinal contours are within normal limits.
Both lungs are clear. The visualized skeletal structures are
unremarkable.
IMPRESSION: No active cardiopulmonary disease.

## 2013-12-16 MED ORDER — AZITHROMYCIN 250 MG PO TABS: ORAL_TABLET | ORAL | Status: DC

## 2013-12-16 MED ORDER — BENZONATATE 100 MG PO CAPS: 100.0000 mg | ORAL_CAPSULE | Freq: Two times a day (BID) | ORAL | Status: DC | PRN

## 2013-12-16 NOTE — Patient Instructions (Signed)
Please go to [facility] imaging in [place] and have your x-ray completed as soon as possible. We will call you back with the results and discuss treatment needed at that time I have called in [facility] we discussed today for your cough.

## 2013-12-16 NOTE — Progress Notes (Signed)
   Subjective:    Patient ID: Penny Hansen, female    DOB: 07-Jan-1986, 28 y.o.   MRN: [id number]  HPI Penny Hansen is a 28 y.o. female presents to same-day clinic  Cough: Patient was seen in urgent care approximately 3 weeks ago for similar symptoms of cough congestion. She states now she has concerns because she is feeling a little discomfort in her right thoracic region with inhalation. Patient was seen in urgent care and given a Z-Pak and prednisone which she completed. She states she initially felt better but has since had continued congestion and cough with an odd smell. She has noticed increasing fatigue, but has been extremely busy at work. She denies fevers or chills, nausea or vomiting or difficulty eating. She's currently not taking any medications.   Past Medical History  Diagnosis Date  . Asthma   . GERD (gastroesophageal reflux disease)     silent GERD which has caused cough in the past   Review of Systems Per history of present illness    Objective:   Physical Exam BP 120/76  Pulse 84  Temp(Src) 99.2 F (37.3 C) (Oral)  Wt 122 lb (55.339 kg) Gen: Pleasant, Caucasian female in no acute distress, nontoxic appearance, well-developed, well-nourished. HEENT: AT. [place]. Bilateral TM visualized and normal in appearance. Bilateral eyes without injections or icterus. MMM. Bilateral nares with mild erythema. Throat with mild erythema or exudates.  CV: RRR no murmur Chest: CTAB, no wheeze or crackles     Assessment & Plan:

## 2013-12-16 NOTE — Telephone Encounter (Signed)
Please call Penny Hansen and explained to her her chest x-ray was completely normal. No concerns at all for pneumonia of any type. I have called in an additional Z-Pak for her upper respiratory symptoms, just to cover her. If she is not improving I encouraged her to fill that prescription. Otherwise there are Tessalon Perles called in for her cough. She is to drink plenty of fluids, and followup with PCP in one to 2 weeks.

## 2013-12-16 NOTE — Assessment & Plan Note (Signed)
Small concern for aspiration pneumonia. Patient with history of smell with sputum after cough. Chest x-ray today, will call with results in prescribe antibiotics if appropriate Tessalon Perles prescribed today for cough Followup in 2 weeks, or sooner if no improvement

## 2013-12-19 NOTE — Telephone Encounter (Signed)
Attempted to call patient phone rang and then got busy signal

## 2013-12-20 NOTE — Telephone Encounter (Signed)
Busy signal

## 2014-01-03 ENCOUNTER — Telehealth: Payer: Self-pay | Admitting: Family Medicine

## 2014-01-03 ENCOUNTER — Other Ambulatory Visit: Payer: Self-pay | Admitting: Family Medicine

## 2014-01-03 NOTE — Telephone Encounter (Signed)
Pt called, was told that if cough did not get better that Dr. Claiborne Billings would give her an additional refill of Z-pak. She is leaving for [place], [place] bc her sister is getting married. She would like the RX to sent to   [facility] [address] [facility] [phone]  Pls call pt once RX has been sent.

## 2014-01-03 NOTE — Telephone Encounter (Signed)
Please call pt and explain she will need to come in to be rechecked. If she is infected, then we will need to evaluate her and possibly prescribed a different medication for her. Thanks.

## 2014-01-04 NOTE — Telephone Encounter (Signed)
Pt informed that she will need to schedule an appt for a recheck and possibly a different type of medication.  Pt is aware that the Z-pac refill was denied.  Pt is still having a cough with yellow sputum.  Pt advised she should schedule an appt; is out of town for a wedding.  Pt stated she will give clinic a call when she returns if she is still coughing.  Pt advised to go to an urgent care near by if symptoms worsen.  Pt stated understanding.  Clovis PuMartin, Xaviera Flaten L, RN

## 2014-01-21 ENCOUNTER — Encounter (HOSPITAL_COMMUNITY): Payer: Self-pay | Admitting: Emergency Medicine

## 2014-01-21 ENCOUNTER — Emergency Department (HOSPITAL_COMMUNITY): Payer: PRIVATE HEALTH INSURANCE

## 2014-01-21 ENCOUNTER — Emergency Department (HOSPITAL_COMMUNITY)
Admission: EM | Admit: 2014-01-21 | Discharge: 2014-01-22 | Disposition: A | Discharge status: Home / Self Care | Payer: PRIVATE HEALTH INSURANCE | Attending: Emergency Medicine | Admitting: Emergency Medicine

## 2014-01-21 DIAGNOSIS — S9031XA Contusion of right foot, initial encounter: Secondary | ICD-10-CM | POA: Diagnosis not present

## 2014-01-21 DIAGNOSIS — Z880 Allergy status to penicillin: Secondary | ICD-10-CM | POA: Diagnosis not present

## 2014-01-21 DIAGNOSIS — J45909 Unspecified asthma, uncomplicated: Secondary | ICD-10-CM | POA: Diagnosis not present

## 2014-01-21 DIAGNOSIS — S99921A Unspecified injury of right foot, initial encounter: Secondary | ICD-10-CM | POA: Diagnosis present

## 2014-01-21 DIAGNOSIS — Z8719 Personal history of other diseases of the digestive system: Secondary | ICD-10-CM | POA: Insufficient documentation

## 2014-01-21 DIAGNOSIS — Z79899 Other long term (current) drug therapy: Secondary | ICD-10-CM | POA: Diagnosis not present

## 2014-01-21 DIAGNOSIS — W228XXA Striking against or struck by other objects, initial encounter: Secondary | ICD-10-CM | POA: Insufficient documentation

## 2014-01-21 MED ORDER — IBUPROFEN 800 MG PO TABS: 800.0000 mg | ORAL_TABLET | Freq: Three times a day (TID) | ORAL | Status: DC

## 2014-01-21 NOTE — ED Notes (Addendum)
Pt states she rolled a heavy piece of equipment up against the back of her R foot. C/o pain to achielles tendon area of R foot. Increased pain with flexion and ambulation. Took Motrin 600 mg at 1930 and eased pain a bit. Rates pain 5/10.

## 2014-01-21 NOTE — Discharge Instructions (Signed)
Cryotherapy °Cryotherapy means treatment with cold. Ice or gel packs can be used to reduce both pain and swelling. Ice is the most helpful within the first 24 to 48 hours after an injury or flare-up from overusing a muscle or joint. Sprains, strains, spasms, burning pain, shooting pain, and aches can all be eased with ice. Ice can also be used when recovering from surgery. Ice is effective, has very few side effects, and is safe for most people to use. °PRECAUTIONS  °Ice is not a safe treatment option for people with: °· Raynaud phenomenon. This is a condition affecting small blood vessels in the extremities. Exposure to cold may cause your problems to return. °· Cold hypersensitivity. There are many forms of cold hypersensitivity, including: °¨ Cold urticaria. Red, itchy hives appear on the skin when the tissues begin to warm after being iced. °¨ Cold erythema. This is a red, itchy rash caused by exposure to cold. °¨ Cold hemoglobinuria. Red blood cells break down when the tissues begin to warm after being iced. The hemoglobin that carry oxygen are passed into the urine because they cannot combine with blood proteins fast enough. °· Numbness or altered sensitivity in the area being iced. °If you have any of the following conditions, do not use ice until you have discussed cryotherapy with your caregiver: °· Heart conditions, such as arrhythmia, angina, or chronic heart disease. °· High blood pressure. °· Healing wounds or open skin in the area being iced. °· Current infections. °· Rheumatoid arthritis. °· Poor circulation. °· Diabetes. °Ice slows the blood flow in the region it is applied. This is beneficial when trying to stop inflamed tissues from spreading irritating chemicals to surrounding tissues. However, if you expose your skin to cold temperatures for too long or without the proper protection, you can damage your skin or nerves. Watch for signs of skin damage due to cold. °HOME CARE INSTRUCTIONS °Follow  these tips to use ice and cold packs safely. °· Place a dry or damp towel between the ice and skin. A damp towel will cool the skin more quickly, so you may need to shorten the time that the ice is used. °· For a more rapid response, add gentle compression to the ice. °· Ice for no more than 10 to 20 minutes at a time. The bonier the area you are icing, the less time it will take to get the benefits of ice. °· Check your skin after 5 minutes to make sure there are no signs of a poor response to cold or skin damage. °· Rest 20 minutes or more between uses. °· Once your skin is numb, you can end your treatment. You can test numbness by very lightly touching your skin. The touch should be so light that you do not see the skin dimple from the pressure of your fingertip. When using ice, most people will feel these normal sensations in this order: cold, burning, aching, and numbness. °· Do not use ice on someone who cannot communicate their responses to pain, such as small children or people with dementia. °HOW TO MAKE AN ICE PACK °Ice packs are the most common way to use ice therapy. Other methods include ice massage, ice baths, and cryosprays. Muscle creams that cause a cold, tingly feeling do not offer the same benefits that ice offers and should not be used as a substitute unless recommended by your caregiver. °To make an ice pack, do one of the following: °· Place crushed ice or a   bag of frozen vegetables in a sealable plastic bag. Squeeze out the excess air. Place this bag inside another plastic bag. Slide the bag into a pillowcase or place a damp towel between your skin and the bag. °· Mix 3 parts water with 1 part rubbing alcohol. Freeze the mixture in a sealable plastic bag. When you remove the mixture from the freezer, it will be slushy. Squeeze out the excess air. Place this bag inside another plastic bag. Slide the bag into a pillowcase or place a damp towel between your skin and the bag. °SEEK MEDICAL CARE  IF: °· You develop white spots on your skin. This may give the skin a blotchy (mottled) appearance. °· Your skin turns blue or pale. °· Your skin becomes waxy or hard. °· Your swelling gets worse. °MAKE SURE YOU:  °· Understand these instructions. °· Will watch your condition. °· Will get help right away if you are not doing well or get worse. °Document Released: 11/18/2010 Document Revised: 08/08/2013 Document Reviewed: 11/18/2010 °[employer]® Patient Information ©2015 [employer]. This information is not intended to replace advice given to you by your health care provider. Make sure you discuss any questions you have with your health care provider. ° °

## 2014-01-21 NOTE — ED Provider Notes (Signed)
CSN: [id number]     Arrival date & time 01/21/14  2203 History   First MD Initiated Contact with Patient 01/21/14 2223     Chief Complaint  Patient presents with  . Foot Injury     (Consider location/radiation/quality/duration/timing/severity/associated sxs/prior Treatment) Patient is a 28 y.o. female presenting with foot injury. The history is provided by the patient. No language interpreter was used.  Foot Injury Location:  Ankle Ankle location:  R ankle Pain details:    Severity:  Moderate Associated symptoms: no fever   Associated symptoms comment:  Pain and bruising to posterior right heel after a piece of machinery rolled up onto the ankle. She reports worsening pain over time since the accident which occurred earlier this evening. No calf pain.    Past Medical History  Diagnosis Date  . Asthma   . GERD (gastroesophageal reflux disease)     silent GERD which has caused cough in the past   Past Surgical History  Procedure Laterality Date  . Tonsilectomy/adenoidectomy with myringotomy    . Wisdom tooth extraction     Family History  Problem Relation Age of Onset  . Ulcerative colitis Mother   . Cancer Paternal Grandmother 655    breast cancer with mastectomy, chemo and radiation  . Diabetes Paternal Grandmother   . Cancer Maternal Grandmother [id number]    breast cancer: lumpectomy  . Heart disease Maternal Grandfather     MI   History  Substance Use Topics  . Smoking status: Never Smoker   . Smokeless tobacco: Not on file  . Alcohol Use: No   OB History   Grav Para Term Preterm Abortions TAB SAB Ect Mult Living                 Review of Systems  Constitutional: Negative for fever and chills.  Musculoskeletal:       See HPI.  Skin: Positive for color change. Negative for wound.  Neurological: Negative.  Negative for numbness.      Allergies  Amoxicillin-pot clavulanate  Home Medications   Prior to Admission medications   Medication Sig Start Date End  Date Taking? Authorizing Provider  norethindrone-ethinyl estradiol (MICROGESTIN,JUNEL,LOESTRIN) 1-20 MG-MCG tablet Take 1 tablet by mouth daily.   Yes Historical Provider, MD  albuterol (PROVENTIL HFA;VENTOLIN HFA) 108 (90 BASE) MCG/ACT inhaler Inhale 2 puffs into the lungs every 6 (six) hours as needed for wheezing or shortness of breath. 11/10/13   Rodolph BongEvan S Corey, MD   BP 118/82  Pulse 78  Temp(Src) 98.5 F (36.9 C) (Oral)  Resp 16  SpO2 100%  LMP 01/18/2014 Physical Exam  Constitutional: She is oriented to person, place, and time. She appears well-developed and well-nourished.  Neck: Normal range of motion.  Cardiovascular: Intact distal pulses.   Pulmonary/Chest: Effort normal.  Musculoskeletal:  Right posterior heel ecchymotic without bony deformity. Ankle joint stable. Achilles tendon intact. No lateral or medial ankle or distal foot tenderness.  Neurological: She is alert and oriented to person, place, and time.  Skin: Skin is warm and dry.    ED Course  Procedures (including critical care time) Labs Review Labs Reviewed - No data to display  Imaging Review No results found.   EKG Interpretation None     Dg Foot Complete Right  01/21/2014   CLINICAL DATA:  Right foot injury tonight,pulled medical equipment NG right foot tonight swelling bruising medially and laterally, initial evaluation  EXAM: RIGHT FOOT COMPLETE - 3+ VIEW  COMPARISON:  None.  FINDINGS: There is no evidence of fracture or dislocation. There is no evidence of arthropathy or other focal bone abnormality. Soft tissues are unremarkable.  IMPRESSION: Negative.   Electronically Signed   By: Esperanza Heir M.D.   On: 01/21/2014 23:25   MDM   Final diagnoses:  None    1. Contusion right foot  No tendon injury. No bony injury/fracture on imaging. She states she feels able to walk and declines need for crutches. Discussed ibuprofen, PCP follow up prn.    Arnoldo Hooker, PA-C 01/22/14 [phone]

## 2014-01-22 NOTE — ED Provider Notes (Signed)
Medical screening examination/treatment/procedure(s) were performed by non-physician practitioner and as supervising physician I was immediately available for consultation/collaboration.  Toy CookeyMegan Docherty, MD 01/22/14 [phone]

## 2014-10-10 ENCOUNTER — Ambulatory Visit (INDEPENDENT_AMBULATORY_CARE_PROVIDER_SITE_OTHER): Payer: PRIVATE HEALTH INSURANCE | Admitting: Family Medicine

## 2014-10-10 VITALS — BP 108/62 | HR 83 | Temp 98.6°F | Resp 17 | Ht 68.0 in | Wt 126.2 lb

## 2014-10-10 DIAGNOSIS — J029 Acute pharyngitis, unspecified: Secondary | ICD-10-CM | POA: Diagnosis not present

## 2014-10-10 LAB — POCT RAPID STREP A (OFFICE): RAPID STREP A SCREEN: NEGATIVE

## 2014-10-10 MED ORDER — PSEUDOEPHEDRINE-GUAIFENESIN ER 120-1200 MG PO TB12: 1.0000 | ORAL_TABLET | Freq: Every morning | ORAL | Status: DC

## 2014-10-10 MED ORDER — HYDROCOD POLST-CPM POLST ER 10-8 MG/5ML PO SUER: 5.0000 mL | Freq: Every evening | ORAL | Status: DC | PRN

## 2014-10-10 MED ORDER — ALBUTEROL SULFATE HFA 108 (90 BASE) MCG/ACT IN AERS: 2.0000 | INHALATION_SPRAY | RESPIRATORY_TRACT | Status: DC | PRN

## 2014-10-10 MED ORDER — CEFDINIR 300 MG PO CAPS: 600.0000 mg | ORAL_CAPSULE | Freq: Every day | ORAL | Status: DC

## 2014-10-10 NOTE — Patient Instructions (Signed)

## 2014-10-10 NOTE — Progress Notes (Signed)
Subjective:    Patient ID: Penny Hansen, female    DOB: [birth year], 29 y.o.   MRN: [id number]  This chart was scribed for Norberto Sorenson, MD by Littie Deeds, Medical Scribe. This patient was seen in Room 2 and the patient's care was started at 10:04 AM.    Chief Complaint  Patient presents with  . Sore Throat    started friday   . Ear Pain    left ear  . Cough    dry cough     HPI HPI Comments: Penny Hansen is a 29 y.o. female with a hx of asthma who presents to the [facility] complaining of gradual onset sore throat that started 4 days ago. Patient also reports having left ear pain, dry cough, rhinorrhea with yellow discharge, generalized myalgias, and swollen cervical lymph nodes. The ear pain is worsened with yawning and swallowing. She has been taking Sudafed, Alka Seltzer Cough & Cold, and Mucinex D with some relief to her symptoms. Patient denies fever, wheezing and SOB. She has not had any problems with her breathing.  She has been eating, drinking, urinating and making bowel movements without any problems. She did call out of work today due to her symptoms. Patient works at [employer]. Her husband has been sick starting last week with cough and chest congestion; he was diagnosed with conjunctivitis and was told his upper respiratory symptoms were viral. Her PSHx includes tonsillectomy. Patient notes that she has some GI issues with Augmentin and would prefer another antibiotic.  Past Medical History  Diagnosis Date  . Asthma   . GERD (gastroesophageal reflux disease)     silent GERD which has caused cough in the past   Current Outpatient Prescriptions on File Prior to Visit  Medication Sig Dispense Refill  . albuterol (PROVENTIL HFA;VENTOLIN HFA) 108 (90 BASE) MCG/ACT inhaler Inhale 2 puffs into the lungs every 6 (six) hours as needed for wheezing or shortness of breath. 1 Inhaler 2  . norethindrone-ethinyl estradiol  (MICROGESTIN,JUNEL,LOESTRIN) 1-20 MG-MCG tablet Take 1 tablet by mouth daily.    Marland Kitchen ibuprofen (ADVIL,MOTRIN) 800 MG tablet Take 1 tablet (800 mg total) by mouth 3 (three) times daily. (Patient not taking: Reported on 10/10/2014) 21 tablet 0   No current facility-administered medications on file prior to visit.   Allergies  Allergen Reactions  . Amoxicillin-Pot Clavulanate Other (See Comments)    Gi upset     Review of Systems  Constitutional: Positive for appetite change. Negative for fever, chills, diaphoresis and fatigue.  HENT: Positive for congestion, ear pain, postnasal drip, rhinorrhea, sore throat and trouble swallowing. Negative for ear discharge, mouth sores, nosebleeds, sinus pressure, sneezing and voice change.   Eyes: Negative for pain and itching.  Respiratory: Positive for cough. Negative for apnea, chest tightness, shortness of breath and wheezing.   Cardiovascular: Negative for chest pain.  Gastrointestinal: Negative for nausea, vomiting, abdominal pain, diarrhea and constipation.  Genitourinary: Negative for dysuria.  Musculoskeletal: Positive for myalgias and neck pain. Negative for joint swelling, arthralgias, gait problem and neck stiffness.  Neurological: Negative for dizziness, syncope and headaches.  Hematological: Positive for adenopathy.  Psychiatric/Behavioral: Positive for sleep disturbance.       Objective:  BP 108/62 mmHg  Pulse 83  Temp(Src) 98.6 F (37 C) (Oral)  Resp 17  Ht 5\' 8"  (1.727 m)  Wt 126 lb 3.2 oz (57.244 kg)  BMI 19.19 kg/m2  SpO2 99%  LMP 09/27/2014  Physical Exam  Constitutional: She is oriented to person, place, and time. She appears well-developed and well-nourished. No distress.  HENT:  Head: Normocephalic and atraumatic.  Right Ear: Tympanic membrane normal.  Left Ear: Tympanic membrane normal.  Nose: Rhinorrhea present.  Mouth/Throat: Posterior oropharyngeal erythema present. No oropharyngeal exudate.  Nasal mucosal  erythema. Purulent rhinorrhea. Oropharynx with severe erythema and postnasal drip with some petechiae and small ulcerations.  Eyes: Pupils are equal, round, and reactive to light.  Neck: Neck supple. No thyromegaly present.  Thyroid normal. Anterior cervical adenopathy bilaterally.  Cardiovascular: Normal rate, S1 normal, S2 normal and normal heart sounds.   No murmur heard. Pulmonary/Chest: Effort normal and breath sounds normal. No respiratory distress. She has no wheezes. She has no rales.  Clear to auscultation bilaterally. Excellent air movement throughout.  Musculoskeletal: She exhibits no edema.  Lymphadenopathy:       Head (right side): No preauricular and no posterior auricular adenopathy present.       Head (left side): No preauricular and no posterior auricular adenopathy present.    She has cervical adenopathy.       Right cervical: No posterior cervical adenopathy present.      Left cervical: No posterior cervical adenopathy present.       Right: No supraclavicular adenopathy present.       Left: No supraclavicular adenopathy present.  Neurological: She is alert and oriented to person, place, and time. No cranial nerve deficit.  Skin: Skin is warm. No rash noted. She is diaphoretic.  Psychiatric: She has a normal mood and affect. Her behavior is normal.  Vitals reviewed.  Results for orders placed or performed in visit on 10/10/14  Culture, Group A Strep  Result Value Ref Range   Organism ID, Bacteria Normal Upper Respiratory Flora    Organism ID, Bacteria No Beta Hemolytic Streptococci Isolated   POCT rapid strep A  Result Value Ref Range   Rapid Strep A Screen Negative Negative       Assessment & Plan:   1. Acute pharyngitis, unspecified pharyngitis type     Orders Placed This Encounter  Procedures  . Culture, Group A Strep  . POCT rapid strep A    Meds ordered this encounter  Medications  . cefdinir (OMNICEF) 300 MG capsule    Sig: Take 2 capsules (600  mg total) by mouth daily.    Dispense:  20 capsule    Refill:  0  . chlorpheniramine-HYDROcodone (TUSSIONEX PENNKINETIC ER) 10-8 MG/5ML SUER    Sig: Take 5 mLs by mouth at bedtime as needed for cough.    Dispense:  60 mL    Refill:  0  . Pseudoephedrine-Guaifenesin (MUCINEX D) [phone] MG TB12    Sig: Take 1 tablet by mouth every morning.    Dispense:  20 each    Refill:  0  . albuterol (PROVENTIL HFA;VENTOLIN HFA) 108 (90 BASE) MCG/ACT inhaler    Sig: Inhale 2 puffs into the lungs every 4 (four) hours as needed for wheezing or shortness of breath (cough, shortness of breath or wheezing.).    Dispense:  1 Inhaler    Refill:  1    I personally performed the services described in this documentation, which was scribed in my presence. The recorded information has been reviewed and considered, and addended by me as needed.  Norberto Sorenson, MD MPH

## 2014-10-11 LAB — CULTURE, GROUP A STREP: Organism ID, Bacteria: NORMAL

## 2014-10-16 ENCOUNTER — Encounter: Payer: Self-pay | Admitting: Family Medicine

## 2014-10-17 ENCOUNTER — Encounter: Payer: Self-pay | Admitting: Family Medicine

## 2014-10-23 ENCOUNTER — Ambulatory Visit (INDEPENDENT_AMBULATORY_CARE_PROVIDER_SITE_OTHER): Payer: PRIVATE HEALTH INSURANCE | Admitting: Family Medicine

## 2014-10-23 VITALS — BP 108/66 | HR 85 | Temp 98.4°F | Resp 16 | Ht 67.0 in | Wt 126.0 lb

## 2014-10-23 DIAGNOSIS — J029 Acute pharyngitis, unspecified: Secondary | ICD-10-CM

## 2014-10-23 DIAGNOSIS — R1012 Left upper quadrant pain: Secondary | ICD-10-CM

## 2014-10-23 DIAGNOSIS — J4521 Mild intermittent asthma with (acute) exacerbation: Secondary | ICD-10-CM | POA: Diagnosis not present

## 2014-10-23 LAB — POCT CBC
Granulocyte percent: 62 %G (ref 37–80)
HEMATOCRIT: 38.2 % (ref 37.7–47.9)
Hemoglobin: 13.2 g/dL (ref 12.2–16.2)
Lymph, poc: 2.7 (ref 0.6–3.4)
MCH, POC: 30.7 pg (ref 27–31.2)
MCHC: 34.5 g/dL (ref 31.8–35.4)
MCV: 88.9 fL (ref 80–97)
MID (cbc): 0.1 (ref 0–0.9)
MPV: 6.4 fL (ref 0–99.8)
POC Granulocyte: 4.5 (ref 2–6.9)
POC LYMPH PERCENT: 36.4 %L (ref 10–50)
POC MID %: 1.6 % (ref 0–12)
Platelet Count, POC: 338 10*3/uL (ref 142–424)
RBC: 4.3 M/uL (ref 4.04–5.48)
RDW, POC: 12.2 %
WBC: 7.3 10*3/uL (ref 4.6–10.2)

## 2014-10-23 LAB — POCT RAPID STREP A (OFFICE): Rapid Strep A Screen: NEGATIVE

## 2014-10-23 MED ORDER — PROMETHAZINE HCL 25 MG PO TABS: 25.0000 mg | ORAL_TABLET | Freq: Three times a day (TID) | ORAL | Status: DC | PRN

## 2014-10-23 MED ORDER — OMEPRAZOLE 40 MG PO CPDR: 40.0000 mg | DELAYED_RELEASE_CAPSULE | Freq: Every day | ORAL | Status: DC

## 2014-10-23 MED ORDER — PREDNISONE 20 MG PO TABS: 40.0000 mg | ORAL_TABLET | Freq: Every day | ORAL | Status: DC

## 2014-10-23 MED ORDER — FIRST-DUKES MOUTHWASH MT SUSP: 5.0000 mL | OROMUCOSAL | Status: DC | PRN

## 2014-10-23 MED ORDER — AZITHROMYCIN 250 MG PO TABS: ORAL_TABLET | ORAL | Status: DC

## 2014-10-23 MED ORDER — ONDANSETRON 4 MG PO TBDP: 4.0000 mg | ORAL_TABLET | Freq: Three times a day (TID) | ORAL | Status: DC | PRN

## 2014-10-23 MED ORDER — RANITIDINE HCL 150 MG PO TABS: 150.0000 mg | ORAL_TABLET | Freq: Two times a day (BID) | ORAL | Status: DC

## 2014-10-23 MED ORDER — GI COCKTAIL ~~LOC~~
30.0000 mL | Freq: Once | ORAL | Status: AC
  Administered 2014-10-23: 30 mL via ORAL

## 2014-10-23 NOTE — Patient Instructions (Addendum)
I suspect you have a viral syndrome but not sure yet - hopefully we will get some insight with additional labs.  Rest and sleep as much as you can.  Start a ppi daily and as needed zantac or pepcid for the next several days while it kicks in.  Infectious Mononucleosis Infectious mononucleosis (mono) is a common germ (viral) infection in children, teenagers, and young adults.  CAUSES  Mono is an infection caused by the Malachi Carl virus. The virus is spread by close personal contact with someone who has the infection. It can be passed by contact with your saliva through things such as kissing or sharing drinking glasses. Sometimes, the infection can be spread from someone who does not appear sick but still spreads the virus (asymptomatic carrier state).  SYMPTOMS  The most common symptoms of Mono are:  Sore throat.  Headache.  Fatigue.  Muscle aches.  Swollen glands.  Fever.  Poor appetite.  Enlarged liver or spleen. The less common symptoms can include:  Rash.  Feeling sick to your stomach (nauseous).  Abdominal pain. DIAGNOSIS  Mono is diagnosed by a blood test.  TREATMENT  Treatment of mono is usually at home. There is no medicine that cures this virus. Sometimes hospital treatment is needed in severe cases. Steroid medicine sometimes is needed if the swelling in the throat causes breathing or swallowing problems.  HOME CARE INSTRUCTIONS   Drink enough fluids to keep your urine clear or pale yellow.  Eat soft foods. Cool foods like popsicles or ice cream can soothe a sore throat.  Only take over-the-counter or prescription medicines for pain, discomfort, or fever as directed by your caregiver. Children under 56 years of age should not take aspirin.  Gargle salt water. This may help relieve your sore throat. Put 1 teaspoon (tsp) of salt in 1 cup of warm water. Sucking on hard candy may also help.  Rest as needed.  Start regular activities gradually after the fever is  gone. Be sure to rest when tired.  Avoid strenuous exercise or contact sports until your caregiver says it is okay. The liver and spleen could be seriously injured.  Avoid sharing drinking glasses or kissing until your caregiver tells you that you are no longer contagious. SEEK MEDICAL CARE IF:   Your fever is not gone after 7 days.  Your activity level is not back to normal after 2 weeks.  You have yellow coloring to eyes and skin (jaundice). SEEK IMMEDIATE MEDICAL CARE IF:   You have severe pain in the abdomen or shoulder.  You have trouble swallowing or drooling.  You have trouble breathing.  You develop a stiff neck.  You develop a severe headache.  You cannot stop throwing up (vomiting).  You have convulsions.  You are confused.  You have trouble with balance.  You develop signs of body fluid loss (dehydration):  Weakness.  Sunken eyes.  Pale skin.  Dry mouth.  Rapid breathing or pulse. MAKE SURE YOU:   Understand these instructions.  Will watch your condition.  Will get help right away if you are not doing well or get worse. Document Released: 03/21/2000 Document Revised: 06/16/2011 Document Reviewed: 01/18/2008 [facility] Patient Information 2015 [place], [place]. This information is not intended to replace advice given to you by your health care provider. Make sure you discuss any questions you have with your health care provider.

## 2014-10-23 NOTE — Progress Notes (Signed)
Subjective:    Patient ID: Penny Hansen, female    DOB: [birth year], 29 y.o.   MRN: [id number] This chart was scribed for Norberto Sorenson, MD by Jolene Provost, Medical Scribe. This patient was seen in Room 14 and the patient's care was started a 7:08 PM.  Chief Complaint  Patient presents with  . Follow-up    URI, still having symptoms  . Abdominal Pain    LUQ, x 4 days  . Ear Pain    HPI HPI Comments: Joye Wesenberg is a 29 y.o. female with a PMH of asthma who presents to [facility] complaining of continued sore throat with severe throat pain with swallowing, as well as associated ear pain and LUQ abdominal pain. Pt endorses associated nausea, and intermittent vomiting over the last week. Pt reported to [facility] on 10/10/2014 complaining of URI and was treated with omnisef, and at that time she reported using her haler more regularly and that her coughing had progressed to the point of regurgitation. Pt states she has been taking 400-600mg  of ibuprofen of ibuprofen to control her throat pain. Pt states she finished her omnisef one week ago. Pt states she took xantec this morning with minimal relief of her abdominal pain or nausea. Pt denies blood in bowel movements, blood in urine, abnormal urinary sx, or vaginal discharge. Pt endorses associated appetite disturbance.   Pt states she has had to use her albuterol twice in the last ten days.   Pt works at [employer].  Past Medical History  Diagnosis Date  . Asthma   . GERD (gastroesophageal reflux disease)     silent GERD which has caused cough in the past   Allergies  Allergen Reactions  . Amoxicillin-Pot Clavulanate Other (See Comments)    Gi upset   Current Outpatient Prescriptions on File Prior to Visit  Medication Sig Dispense Refill  . albuterol (PROVENTIL HFA;VENTOLIN HFA) 108 (90 BASE) MCG/ACT inhaler Inhale 2 puffs into the lungs every 6 (six) hours as needed for wheezing or shortness of breath. 1 Inhaler 2  .  albuterol (PROVENTIL HFA;VENTOLIN HFA) 108 (90 BASE) MCG/ACT inhaler Inhale 2 puffs into the lungs every 4 (four) hours as needed for wheezing or shortness of breath (cough, shortness of breath or wheezing.). 1 Inhaler 1  . ibuprofen (ADVIL,MOTRIN) 800 MG tablet Take 1 tablet (800 mg total) by mouth 3 (three) times daily. 21 tablet 0  . norethindrone-ethinyl estradiol (MICROGESTIN,JUNEL,LOESTRIN) 1-20 MG-MCG tablet Take 1 tablet by mouth daily.    . cefdinir (OMNICEF) 300 MG capsule Take 2 capsules (600 mg total) by mouth daily. (Patient not taking: Reported on 10/23/2014) 20 capsule 0  . chlorpheniramine-HYDROcodone (TUSSIONEX PENNKINETIC ER) 10-8 MG/5ML SUER Take 5 mLs by mouth at bedtime as needed for cough. (Patient not taking: Reported on 10/23/2014) 60 mL 0  . Pseudoephedrine-Guaifenesin (MUCINEX D) [phone] MG TB12 Take 1 tablet by mouth every morning. (Patient not taking: Reported on 10/23/2014) 20 each 0   No current facility-administered medications on file prior to visit.     Review of Systems  Constitutional: Positive for activity change, appetite change and fatigue. Negative for fever, chills and diaphoresis.  HENT: Positive for ear pain, sore throat and trouble swallowing. Negative for congestion, ear discharge, mouth sores, nosebleeds, postnasal drip, rhinorrhea, sinus pressure, sneezing and voice change.   Eyes: Negative for photophobia and pain.  Respiratory: Positive for cough and shortness of breath. Negative for wheezing.   Cardiovascular: Negative for chest pain.  Gastrointestinal:  Positive for nausea, vomiting and abdominal pain. Negative for constipation, blood in stool and anal bleeding.  Genitourinary: Negative for dysuria, urgency, frequency, hematuria, decreased urine volume, vaginal discharge, difficulty urinating and pelvic pain.  Musculoskeletal: Positive for myalgias. Negative for joint swelling, arthralgias, gait problem, neck pain and neck stiffness.  Neurological:  Positive for weakness and headaches. Negative for dizziness, syncope and light-headedness.  Hematological: Negative for adenopathy.  Psychiatric/Behavioral: Positive for sleep disturbance.       Objective:  BP 108/66 mmHg  Pulse 85  Temp(Src) 98.4 F (36.9 C)  Resp 16  Ht 5\' 7"  (1.702 m)  Wt 126 lb (57.153 kg)  BMI 19.73 kg/m2  SpO2 98%  LMP 09/27/2014  Physical Exam  Constitutional: She is oriented to person, place, and time. She appears well-developed and well-nourished. No distress.  HENT:  Head: Normocephalic and atraumatic.  Severe erythema with ulcerations on the back of the throat.   Eyes: Pupils are equal, round, and reactive to light.  Neck: Neck supple.  Anterior cervical adenopathy bilaterally. No posterior cervical adenopathy.   Cardiovascular: Normal rate, regular rhythm and normal heart sounds.   No murmur heard. Pulmonary/Chest: Effort normal. No respiratory distress.  Lungs clear, good air movement.   Abdominal: Soft. Bowel sounds are normal. She exhibits no distension. There is tenderness.  Tenderness over epigastric and LLQ, no guarding, no rebound, no masses, no hepatosplenomegaly.   Musculoskeletal: Normal range of motion.  Neurological: She is alert and oriented to person, place, and time. Coordination normal.  Skin: Skin is warm and dry. She is not diaphoretic.  Psychiatric: She has a normal mood and affect. Her behavior is normal.  Nursing note and vitals reviewed.      Assessment & Plan:   1. Asthma, mild intermittent, with acute exacerbation   2. Left upper quadrant pain   3. Acute pharyngitis, unspecified pharyngitis type    May have had very recent mono but cover w/ zpack due to severity of sxs and secondary worsening.   Orders Placed This Encounter  Procedures  . Culture, Group A Strep  . Comprehensive metabolic panel  . Epstein-Barr virus VCA antibody panel  . Lipase  . POCT CBC  . POCT rapid strep A    Meds ordered this encounter    Medications  . gi cocktail (Maalox,Lidocaine,Donnatal)    Sig:   . DISCONTD: predniSONE (DELTASONE) 20 MG tablet    Sig: Take 2 tablets (40 mg total) by mouth daily with breakfast.    Dispense:  10 tablet    Refill:  0  . azithromycin (ZITHROMAX) 250 MG tablet    Sig: Take 2 tabs PO x 1 dose, then 1 tab PO QD x 4 days    Dispense:  6 tablet    Refill:  0  . ondansetron (ZOFRAN ODT) 4 MG disintegrating tablet    Sig: Take 1-2 tablets (4-8 mg total) by mouth every 8 (eight) hours as needed for nausea or vomiting.    Dispense:  40 tablet    Refill:  0  . Diphenhyd-Hydrocort-Nystatin (FIRST-DUKES MOUTHWASH) SUSP    Sig: Use as directed 5 mLs in the mouth or throat every 2 (two) hours as needed (sore throat).    Dispense:  237 mL    Refill:  0    Please mix in your pharmacies formulary or in a 1:1:1:1 ratio with viscous lidocaine  . omeprazole (PRILOSEC) 40 MG capsule    Sig: Take 1 capsule (40 mg total) by mouth  daily.    Dispense:  30 capsule    Refill:  1  . ranitidine (ZANTAC) 150 MG tablet    Sig: Take 1 tablet (150 mg total) by mouth 2 (two) times daily.    Dispense:  60 tablet    Refill:  1  . predniSONE (DELTASONE) 20 MG tablet    Sig: Take 2 tablets (40 mg total) by mouth daily with breakfast.    Dispense:  10 tablet    Refill:  0  . promethazine (PHENERGAN) 25 MG tablet    Sig: Take 1 tablet (25 mg total) by mouth every 8 (eight) hours as needed for nausea or vomiting.    Dispense:  20 tablet    Refill:  0    I personally performed the services described in this documentation, which was scribed in my presence. The recorded information has been reviewed and considered, and addended by me as needed.  Norberto Sorenson, MD MPH   Results for orders placed or performed in visit on 10/23/14  Culture, Group A Strep  Result Value Ref Range   Organism ID, Bacteria Normal Upper Respiratory Flora    Organism ID, Bacteria No Beta Hemolytic Streptococci Isolated   Comprehensive  metabolic panel  Result Value Ref Range   Sodium 138 135 - 145 mEq/L   Potassium 4.0 3.5 - 5.3 mEq/L   Chloride 103 96 - 112 mEq/L   CO2 25 19 - 32 mEq/L   Glucose, Bld 89 70 - 99 mg/dL   BUN 10 6 - 23 mg/dL   Creat 7.82 9.56 - 2.13 mg/dL   Total Bilirubin 0.4 0.2 - 1.2 mg/dL   Alkaline Phosphatase 43 39 - 117 U/L   AST 17 0 - 37 U/L   ALT 14 0 - 35 U/L   Total Protein 7.3 6.0 - 8.3 g/dL   Albumin 4.2 3.5 - 5.2 g/dL   Calcium 9.5 8.4 - 08.6 mg/dL  Epstein-Barr virus VCA antibody panel  Result Value Ref Range   EBV VCA IgG >750.0 (H) <18.0 U/mL   EBV VCA IgM 20.7 <36.0 U/mL   EBV EA IgG 39.0 (H) <9.0 U/mL   EBV NA IgG >600.0 (H) <18.0 U/mL  Lipase  Result Value Ref Range   Lipase 11 0 - 75 U/L  POCT CBC  Result Value Ref Range   WBC 7.3 4.6 - 10.2 K/uL   Lymph, poc 2.7 0.6 - 3.4   POC LYMPH PERCENT 36.4 10 - 50 %L   MID (cbc) 0.1 0 - 0.9   POC MID % 1.6 0 - 12 %M   POC Granulocyte 4.5 2 - 6.9   Granulocyte percent 62.0 37 - 80 %G   RBC 4.30 4.04 - 5.48 M/uL   Hemoglobin 13.2 12.2 - 16.2 g/dL   HCT, POC 57.8 46.9 - 47.9 %   MCV 88.9 80 - 97 fL   MCH, POC 30.7 27 - 31.2 pg   MCHC 34.5 31.8 - 35.4 g/dL   RDW, POC 62.9 %   Platelet Count, POC 338 142 - 424 K/uL   MPV 6.4 0 - 99.8 fL  POCT rapid strep A  Result Value Ref Range   Rapid Strep A Screen Negative Negative

## 2014-10-24 ENCOUNTER — Telehealth: Payer: Self-pay

## 2014-10-24 LAB — COMPREHENSIVE METABOLIC PANEL
ALBUMIN: 4.2 g/dL (ref 3.5–5.2)
ALT: 14 U/L (ref 0–35)
AST: 17 U/L (ref 0–37)
Alkaline Phosphatase: 43 U/L (ref 39–117)
BILIRUBIN TOTAL: 0.4 mg/dL (ref 0.2–1.2)
BUN: 10 mg/dL (ref 6–23)
CALCIUM: 9.5 mg/dL (ref 8.4–10.5)
CO2: 25 meq/L (ref 19–32)
Chloride: 103 mEq/L (ref 96–112)
Creat: 0.66 mg/dL (ref 0.50–1.10)
Glucose, Bld: 89 mg/dL (ref 70–99)
POTASSIUM: 4 meq/L (ref 3.5–5.3)
SODIUM: 138 meq/L (ref 135–145)
Total Protein: 7.3 g/dL (ref 6.0–8.3)

## 2014-10-24 LAB — EPSTEIN-BARR VIRUS VCA ANTIBODY PANEL
EBV EA IGG: 39 U/mL — AB (ref <9.0)
EBV NA IgG: >600 U/mL — ABNORMAL HIGH (ref <18.0)
EBV VCA IGM: 20.7 U/mL (ref <36.0)

## 2014-10-24 LAB — LIPASE: LIPASE: 11 U/L (ref 0–75)

## 2014-10-24 NOTE — Telephone Encounter (Signed)
[facility] called regarding Duke's Mouthwash Rx. They weren't sure which ratio you wanted. They filled half dukes and half lidocaine.

## 2014-10-25 NOTE — Telephone Encounter (Signed)
That's fine - I wrote on the rx that it should be 1:1:1:1 which would be the same as 3 parts magic mouthwash to 1 part lidocaine but I don't really care - 1:1 is fine

## 2014-10-26 LAB — CULTURE, GROUP A STREP: Organism ID, Bacteria: NORMAL

## 2014-10-26 NOTE — Telephone Encounter (Signed)
[facility]..spoke to Monica/Pharmacist - She advsd  Bryan/Pharmacist had already spoken to someone at [facility] and filled Rx as 1:1.

## 2015-08-17 ENCOUNTER — Ambulatory Visit: Payer: Self-pay | Admitting: Physician Assistant

## 2015-08-28 ENCOUNTER — Telehealth: Payer: Self-pay

## 2015-08-29 ENCOUNTER — Ambulatory Visit: Payer: Self-pay | Admitting: Physician Assistant

## 2015-09-06 ENCOUNTER — Telehealth: Payer: Self-pay | Admitting: *Deleted

## 2015-09-06 NOTE — Telephone Encounter (Signed)
Unable to reach patient at time of pre-visit call. Left message for patient to return call when available.  

## 2015-09-07 ENCOUNTER — Encounter: Payer: Self-pay | Admitting: Physician Assistant

## 2015-09-07 ENCOUNTER — Ambulatory Visit (INDEPENDENT_AMBULATORY_CARE_PROVIDER_SITE_OTHER): Payer: PRIVATE HEALTH INSURANCE | Admitting: Physician Assistant

## 2015-09-07 VITALS — BP 102/66 | HR 94 | Temp 98.3°F | Resp 16 | Ht 67.0 in | Wt 127.5 lb

## 2015-09-07 DIAGNOSIS — R5383 Other fatigue: Secondary | ICD-10-CM

## 2015-09-07 DIAGNOSIS — Z8719 Personal history of other diseases of the digestive system: Secondary | ICD-10-CM

## 2015-09-07 DIAGNOSIS — S199XXA Unspecified injury of neck, initial encounter: Secondary | ICD-10-CM | POA: Diagnosis not present

## 2015-09-07 LAB — CBC
HCT: 40.9 % (ref 35.0–45.0)
Hemoglobin: 13.8 g/dL (ref 11.7–15.5)
MCH: 30.8 pg (ref 27.0–33.0)
MCHC: 33.7 g/dL (ref 32.0–36.0)
MCV: 91.3 fL (ref 80.0–100.0)
MPV: 9 fL (ref 7.5–12.5)
Platelets: 283 10*3/uL (ref 140–400)
RBC: 4.48 MIL/uL (ref 3.80–5.10)
RDW: 12.3 % (ref 11.0–15.0)
WBC: 7 10*3/uL (ref 3.8–10.8)

## 2015-09-07 LAB — TSH: TSH: 1.1 mIU/L

## 2015-09-07 LAB — T4, FREE: FREE T4: 1.2 ng/dL (ref 0.8–1.8)

## 2015-09-07 LAB — VITAMIN B12: VITAMIN B 12: 486 pg/mL (ref 200–1100)

## 2015-09-07 NOTE — Patient Instructions (Signed)
Your head and neck examination are within normal limits. No reason you cannot have a massage.  I have written a note for you.  In regards to the fatigue, please go to the lab for blood work. I will call with results. If normal would recommend we re-discuss ADD screen versus start an SSRI (Sertraline, etc).

## 2015-09-07 NOTE — Progress Notes (Signed)
Patient presents to clinic today to establish care.  Acute Concerns: Patient c/o fatigue over the past several months. Denies depressed mood but does not some anhedonia. Just does not have interest in certain daily activities. Denies change to sleep or appetite. Denies constipation or dry skin. Does note cold intolerance. Denies history of anemia or vitamin deficiency.   Patient also needing neck exam. States she hit her head on a pool innertube and had mild tenderness in the neck for a couple of days. Denies headache, decreased ROM. Endorses she went to have a massage today, mentioned the incident, and was told she would need a doctor's note in order to continue massages.  Chronic Issues: GERD -- Endorses history of silent GERD with cough. No symptoms at present. Occurs when eating too much at mealtime. Take Zantac prn. Denies epigastric pain, nausea or vomiting. Denies change to stools  Asthma -- Rare symptoms. No use of inhaler in quite some time. Only uses with colds. Denies nighttime symptoms.   Health Maintenance: PAP -- Followed GYN.  Past Medical History  Diagnosis Date  . Asthma   . GERD (gastroesophageal reflux disease)     silent GERD which has caused cough in the past    Past Surgical History  Procedure Laterality Date  . Tonsilectomy/adenoidectomy with myringotomy  2012  . Wisdom tooth extraction  2010    Current Outpatient Prescriptions on File Prior to Visit  Medication Sig Dispense Refill  . albuterol (PROVENTIL HFA;VENTOLIN HFA) 108 (90 BASE) MCG/ACT inhaler Inhale 2 puffs into the lungs every 6 (six) hours as needed for wheezing or shortness of breath. 1 Inhaler 2  . norethindrone-ethinyl estradiol (MICROGESTIN,JUNEL,LOESTRIN) 1-20 MG-MCG tablet Take 1 tablet by mouth daily.    Marland Kitchen omeprazole (PRILOSEC) 40 MG capsule Take 1 capsule (40 mg total) by mouth daily. (Patient taking differently: Take 40 mg by mouth daily as needed. ) 30 capsule 1  . ranitidine (ZANTAC)  150 MG tablet Take 1 tablet (150 mg total) by mouth 2 (two) times daily. (Patient taking differently: Take 150 mg by mouth 2 (two) times daily as needed. ) 60 tablet 1   No current facility-administered medications on file prior to visit.    Allergies  Allergen Reactions  . Amoxicillin-Pot Clavulanate Other (See Comments)    Gi upset    Family History  Problem Relation Age of Onset  . Ulcerative colitis Mother     Living  . Breast cancer Paternal Grandmother 1    breast cancer with mastectomy, chemo and radiation  . Diabetes Paternal Grandmother   . Breast cancer Maternal Grandmother 59    breast cancer: lumpectomy  . Heart disease Maternal Grandfather     MI  . Hyperlipidemia Father     Living  . Heart attack Maternal Grandfather   . Prostate cancer Paternal Grandfather   . Hypertension Maternal Grandfather   . Healthy Sister     x2    Social History   Social History  . Marital Status: Married    Spouse Name: N/A  . Number of Children: N/A  . Years of Education: N/A   Occupational History  . Not on file.   Social History Main Topics  . Smoking status: Never Smoker   . Smokeless tobacco: Never Used  . Alcohol Use: No  . Drug Use: No  . Sexual Activity: Yes    Birth Control/ Protection: Pill   Other Topics Concern  . Not on file   Social History Narrative  Works as Regulatory affairs officer at [employer]   Review of Systems  Constitutional: Positive for malaise/fatigue. Negative for fever, chills and weight loss.  HENT: Negative for hearing loss.   Eyes: Negative for blurred vision and double vision.  Respiratory: Negative for cough and shortness of breath.   Cardiovascular: Negative for chest pain and palpitations.  Gastrointestinal: Negative for heartburn, nausea, vomiting, abdominal pain, diarrhea, constipation, blood in stool and melena.  Neurological: Negative for dizziness, loss of consciousness and headaches.  Psychiatric/Behavioral: Negative  for depression, suicidal ideas, hallucinations and substance abuse. The patient is not nervous/anxious and does not have insomnia.        + anhedonia   BP 102/66 mmHg  Pulse 94  Temp(Src) 98.3 F (36.8 C) (Oral)  Resp 16  Ht  (1.702 m)  Wt 127 lb 8 oz (57.834 kg)  BMI 19.96 kg/m2  SpO2 99%  LMP 08/20/2015  Physical Exam  Constitutional: She is oriented to person, place, and time and well-developed, well-nourished, and in no distress.  HENT:  Head: Normocephalic and atraumatic.  Right Ear: External ear normal.  Left Ear: External ear normal.  Nose: Nose normal.  Mouth/Throat: Oropharynx is clear and moist. No oropharyngeal exudate.  TM within normal limits bilaterally  Eyes: Conjunctivae are normal. Pupils are equal, round, and reactive to light.  Neck: Normal range of motion and full passive range of motion without pain. Neck supple. No spinous process tenderness and no muscular tenderness present. Normal range of motion present. No thyromegaly present.  Cardiovascular: Normal rate, regular rhythm, normal heart sounds and intact distal pulses.   Pulmonary/Chest: Effort normal and breath sounds normal. No respiratory distress. She has no wheezes. She has no rales. She exhibits no tenderness.  Musculoskeletal:       Cervical back: Normal.       Thoracic back: Normal.  Neurological: She is alert and oriented to person, place, and time.  Skin: Skin is warm and dry. No rash noted.  Vitals reviewed.  Assessment/Plan: History of gastroesophageal reflux (GERD) Patient endorsed. Gets dry cough when flared. Asymptomatic at present. Diet reviewed. Continue Zantac PRN.  Neck injury Prior mild strain. Asymptomatic. C-spine and HENT exam within normal limits. Patient given note stating she is ok to receive massages. Supportive measures reviewed.  Other fatigue Unclear etiology although suspect there is a component of depression giving anhedonia. Discussed potential causes of how she  is feeling. Will check labs today -- CBC, TSH, T4, B12 and Vitamin D. Will proceed with further testing/management based on results. Recommended consideration of SSRI if labs normal.      Piedad Climes, PA-C

## 2015-09-07 NOTE — Telephone Encounter (Signed)
Pre Visit call 

## 2015-09-07 NOTE — Progress Notes (Signed)
Pre visit review using our clinic review tool, if applicable. No additional management support is needed unless otherwise documented below in the visit note/SLS  

## 2015-09-08 LAB — VITAMIN D 25 HYDROXY (VIT D DEFICIENCY, FRACTURES): VIT D 25 HYDROXY: 44 ng/mL (ref 30–100)

## 2015-09-09 DIAGNOSIS — R5383 Other fatigue: Secondary | ICD-10-CM | POA: Insufficient documentation

## 2015-09-09 DIAGNOSIS — K219 Gastro-esophageal reflux disease without esophagitis: Secondary | ICD-10-CM | POA: Insufficient documentation

## 2015-09-09 DIAGNOSIS — S199XXA Unspecified injury of neck, initial encounter: Secondary | ICD-10-CM | POA: Insufficient documentation

## 2015-09-09 NOTE — Assessment & Plan Note (Signed)
Patient endorsed. Gets dry cough when flared. Asymptomatic at present. Diet reviewed. Continue Zantac PRN.

## 2015-09-09 NOTE — Assessment & Plan Note (Signed)
Prior mild strain. Asymptomatic. C-spine and HENT exam within normal limits. Patient given note stating she is ok to receive massages. Supportive measures reviewed.

## 2015-09-09 NOTE — Assessment & Plan Note (Signed)
Unclear etiology although suspect there is a component of depression giving anhedonia. Discussed potential causes of how she is feeling. Will check labs today -- CBC, TSH, T4, B12 and Vitamin D. Will proceed with further testing/management based on results. Recommended consideration of SSRI if labs normal.

## 2015-09-10 ENCOUNTER — Telehealth: Payer: Self-pay | Admitting: *Deleted

## 2015-09-10 MED ORDER — ESCITALOPRAM OXALATE 10 MG PO TABS: ORAL_TABLET | ORAL | Status: DC

## 2015-09-10 NOTE — Telephone Encounter (Signed)
Patient informed, understood & agreed to try SSRI and request Rx to go to [facility]/SLS 06/05

## 2015-09-10 NOTE — Telephone Encounter (Signed)
Will start Lexapro 10 mg daily. Take 1/2 tablet daily for 1 week. Then increase to 1 tablet daily. Follow-up in 4-6 weeks. If she notices any worsening mood on the medication she is to stop and give me a call.

## 2015-09-10 NOTE — Telephone Encounter (Signed)
-----   Message from Waldon Merl, PA-C sent at 09/09/2015  6:21 PM EDT ----- Labs are all fantastic -- Thyroid function, Vitamin D, Vitamin B12 and Blood count or all normal. Nothing to contribute to fatigue and anhedonia. Would recommend we wither start an SSRI to help or consider ADD testing as focus and motivation are two of her biggest symptoms presently.

## 2015-09-11 NOTE — Telephone Encounter (Signed)
Patient informed, understood & agreed/SLS 6/06

## 2015-09-12 ENCOUNTER — Encounter: Payer: Self-pay | Admitting: Physician Assistant

## 2015-12-27 ENCOUNTER — Encounter: Payer: Self-pay | Admitting: Medical

## 2015-12-27 ENCOUNTER — Telehealth: Payer: Self-pay | Admitting: Physician Assistant

## 2015-12-27 ENCOUNTER — Ambulatory Visit
Admission: RE | Admit: 2015-12-27 | Discharge: 2015-12-27 | Disposition: A | Discharge status: Home / Self Care | Payer: PRIVATE HEALTH INSURANCE | Source: Ambulatory Visit | Attending: Medical | Admitting: Medical

## 2015-12-27 ENCOUNTER — Telehealth: Payer: Self-pay | Admitting: Medical

## 2015-12-27 ENCOUNTER — Ambulatory Visit (INDEPENDENT_AMBULATORY_CARE_PROVIDER_SITE_OTHER): Payer: PRIVATE HEALTH INSURANCE | Admitting: Medical

## 2015-12-27 ENCOUNTER — Other Ambulatory Visit: Payer: Self-pay

## 2015-12-27 VITALS — HR 90 | Temp 98.3°F | Ht 67.0 in | Wt 128.2 lb

## 2015-12-27 DIAGNOSIS — R0789 Other chest pain: Secondary | ICD-10-CM

## 2015-12-27 DIAGNOSIS — Z8709 Personal history of other diseases of the respiratory system: Secondary | ICD-10-CM | POA: Diagnosis not present

## 2015-12-27 DIAGNOSIS — R0600 Dyspnea, unspecified: Secondary | ICD-10-CM

## 2015-12-27 DIAGNOSIS — R079 Chest pain, unspecified: Secondary | ICD-10-CM

## 2015-12-27 LAB — TROPONIN I: TNIDX: 0.01 ug/l (ref 0.00–0.06)

## 2015-12-27 MED ORDER — ALBUTEROL SULFATE HFA 108 (90 BASE) MCG/ACT IN AERS: 2.0000 | INHALATION_SPRAY | Freq: Four times a day (QID) | RESPIRATORY_TRACT | 2 refills | Status: DC | PRN

## 2015-12-27 MED ORDER — FLUTICASONE PROPIONATE HFA 110 MCG/ACT IN AERO
2.0000 | INHALATION_SPRAY | Freq: Two times a day (BID) | RESPIRATORY_TRACT | 2 refills | Status: DC
Start: 2015-12-27 — End: 2016-01-08

## 2015-12-27 MED ORDER — FLUTICASONE PROPIONATE HFA 110 MCG/ACT IN AERO: 2.0000 | INHALATION_SPRAY | Freq: Two times a day (BID) | RESPIRATORY_TRACT | 2 refills | Status: DC

## 2015-12-27 NOTE — Patient Instructions (Addendum)
   For your chest pain on Tuesday we got ekg today. Showed sinus rhythm. But due to intensity of pain and location will try to refer you to cardiologist asap. Trying for tomorrow or early next week Monday or Tuesday.  If you have recurrent severe chest pain or severe dyspnea then ED evalution.  Will get cxr today stat to evaluate if you have possible small pneumothorax.  With your asthma history will provide flovent to use in addition to albuterol. May be worth trying but not classic asthma presentation as we both agree.  Follow up with me or pcp on Monday provided we have not scheduled cardiology appointment yet. Also would ask you give me update tomorrow. Could leave message for karen or myself Ramon Dredge

## 2015-12-27 NOTE — Telephone Encounter (Signed)
Notified pt use motrin 400-600 mg tid. Cardiologist recommended getting echo before appointment with him in early October. Pt will get flovent filled.

## 2015-12-27 NOTE — Progress Notes (Addendum)
Subjective:    Patient ID: Penny Hansen, female    DOB: Aug 29, 1985, 30 y.o.   MRN: [id number]  HPI  Pt in states on Tuesday she was sitting on her coach. Studying on coach. She stood to get up and felt sharp chest pain. Pt states sharp pain lasted for about 2 minutes. Felt dizzy. Almost describes near syncope pain was intense. Distribution of pain left upper chest and lower rib. Worse pain was higher upper chest. After   Hx of asthma. Pt felt mild short of breath since then. She feels like can't take full deep breath. With activity notes feeling mild sob. She took albuterol and she just could not get good deep breath. No leg pain or swelling. No history of smoking. Varicose vein rt leg but not pain.  No jvd. No labored breathing.  No obvious nasal congestion. Rare occasional. No fever, no chills or sweats.  Last menses- Last Wednesday. Normal cycle and came when expected it to.  Pt O2 97% yesterday. But had monitor on side of her finger since had nail polish on.  Pt has no fh of cad at young age. No known hyerperlipedemia. Not smoker.       Review of Systems  Constitutional: Negative for chills, fatigue and fever.  HENT: Negative for congestion, nosebleeds, postnasal drip, rhinorrhea, sinus pressure and sneezing.   Respiratory: Positive for cough, chest tightness and shortness of breath. Negative for wheezing.        Rare occasional cough. See hpi on dypspnea.  Cardiovascular: Negative for chest pain and palpitations.       None now but had 2 days ago. See hpi.  Gastrointestinal: Negative for abdominal pain.  Musculoskeletal: Negative for back pain, myalgias, neck pain and neck stiffness.  Neurological: Negative for dizziness and headaches.  Hematological: Negative for adenopathy. Does not bruise/bleed easily.    Past Medical History:  Diagnosis Date  . Asthma   . GERD (gastroesophageal reflux disease)    silent GERD which has caused cough in the past     Social  History   Social History  . Marital status: Married    Spouse name: N/A  . Number of children: N/A  . Years of education: N/A   Occupational History  . Not on file.   Social History Main Topics  . Smoking status: Never Smoker  . Smokeless tobacco: Never Used  . Alcohol use No  . Drug use: No  . Sexual activity: Yes    Birth control/ protection: Pill   Other Topics Concern  . 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 TONSILECTOMY/ADENOIDECTOMY WITH MYRINGOTOMY  2012  . WISDOM TOOTH EXTRACTION  2010    Family History  Problem Relation Age of Onset  . Ulcerative colitis Mother     Living  . Breast cancer Paternal Grandmother [id number]    breast cancer with mastectomy, chemo and radiation  . Diabetes Paternal Grandmother   . Breast cancer Maternal Grandmother [id number]    breast cancer: lumpectomy  . Heart disease Maternal Grandfather     MI  . Hyperlipidemia Father     Living  . Heart attack Maternal Grandfather   . Prostate cancer Paternal Grandfather   . Hypertension Maternal Grandfather   . Healthy Sister     x2    Allergies  Allergen Reactions  . Amoxicillin-Pot Clavulanate Other (  See Comments)    Gi upset    Current Outpatient Prescriptions on File Prior to Visit  Medication Sig Dispense Refill  . albuterol (PROVENTIL HFA;VENTOLIN HFA) 108 (90 BASE) MCG/ACT inhaler Inhale 2 puffs into the lungs every 6 (six) hours as needed for wheezing or shortness of breath. 1 Inhaler 2  . Ascorbic Acid (VITAMIN C) 100 MG tablet Take 100 mg by mouth as directed.    . Multiple Vitamins-Minerals (MULTIVITAMIN GUMMIES WOMENS) CHEW Chew by mouth as directed.    . norethindrone-ethinyl estradiol (MICROGESTIN,JUNEL,LOESTRIN) 1-20 MG-MCG tablet Take 1 tablet by mouth daily.    Marland Kitchen omeprazole (PRILOSEC) 40 MG capsule Take 1 capsule (40 mg total) by mouth daily. (Patient taking  differently: Take 40 mg by mouth daily as needed. ) 30 capsule 1  . ranitidine (ZANTAC) 150 MG tablet Take 1 tablet (150 mg total) by mouth 2 (two) times daily. (Patient taking differently: Take 150 mg by mouth 2 (two) times daily as needed. ) 60 tablet 1  . escitalopram (LEXAPRO) 10 MG tablet Take 1/2 tablet daily for 1 week. Then increase to 1 tablet daily (Patient not taking: Reported on 12/27/2015) 30 tablet 1   No current facility-administered medications on file prior to visit.     Pulse 90   Temp 98.3 F (36.8 C) (Oral)   Ht 5\' 7"  (1.702 m)   Wt 128 lb 3.2 oz (58.2 kg)   LMP 12/22/2015   SpO2 100%   BMI 20.08 kg/m       Objective:   Physical Exam   General Mental Status- Alert. General Appearance- Not in acute distress.   Skin General: Color- Normal Color. Moisture- Normal Moisture.  Neck Carotid Arteries- Normal color. Moisture- Normal Moisture. No carotid bruits. No JVD. No tracheal deviation.  Chest and Lung Exam Auscultation: Breath Sounds:-Normal. CTA. On ausculation symmetric lung sounds.  Cardiovascular Auscultation:Rythm- Regular. Murmurs & Other Heart Sounds:Auscultation of the heart reveals- No Murmurs.  Abdomen Inspection:-Inspeection Normal. Palpation/Percussion:Note:No mass. Palpation and Percussion of the abdomen reveal- Non Tender, Non Distended + BS, no rebound or guarding.    Neurologic Cranial Nerve exam:- CN III-XII intact(No nystagmus), symmetric smile. Strength:- 5/5 equal and symmetric strength both upper and lower extremities.  Anterior thorax- on palpation anterior thorax. Costochondral junction no tenderness.  Skin- no rash.   Lower ext- no pedal edema. Calfs are symmetric. Negative homans signs.       Assessment & Plan:  ekg nml sinus rhythm. Discussed potential work up. Possible include d-dimer. Aware if test were positive then would need ct of chest. Pt declines d-dimer. Clinically likely not necessary at this point but  will possible need to re-address if symptoms change or persist.   For your chest pain on Tuesday we got ekg today. Showed sinus rhythm. But due to intensity of pain and location will try to refer you to cardiologist asap. Trying for tomorrow or early next week.(monday or Tuesday)  If you have recurrent severe chest pain or severe dyspnea then ED evalution.  Will get cxr today stat to evaluate if you have possible small pneumothorax.  With your asthma history will provide flovent to use in addition to albuterol. May be worth trying but not classic asthma presentation as we both agree.  Follow up with me or pcp on Monday provided we have not scheduled cardiology appointment yet. Also would ask you give me update tomorrow. Could leave message for karen or myself Ramon Dredge   Walk down hall to lab from  room times showed 97-98% O2 sat consistent. Pulse end of walk 87.  Notified Victorino Dike to see referral and please start working on.  Note also considering diff dx. Pericarditis but not suspicious for at this point.  Talked with Dr. Lourena Simmonds. He thought unlikley cardiac but recommended echo. Discussed diff dx with him. He recommended motrin during interim. If pt symptoms worsen or change severe then ED. If some mild increase would also contact his office and see if they can see him sooner. Victorino Dike will try to get her appointment scheduled Jan 07, 2016.

## 2015-12-27 NOTE — Telephone Encounter (Signed)
Relation to pt: self  Call back number:[phone] Pharmacy: [facility] [address] [place], [place] [address]    Reason for call:  Patient requesting medication prescribed today  fluticasone (FLOVENT HFA) 110 MCG/ACT inhaler and albuterol (PROVENTIL HFA;VENTOLIN HFA) 108 (90 Base) MCG/ACT inhaler please send to new pharmacy.

## 2015-12-28 ENCOUNTER — Telehealth: Payer: Self-pay

## 2015-12-28 NOTE — Telephone Encounter (Signed)
Called patient to see which Pharmacy she wants her Albuterol Rx to be sent to. Per patient all Pharmacies need to be deleted except for [facility] which [facility] to where she lives.. This has been done.

## 2015-12-29 NOTE — Telephone Encounter (Signed)
I put in echo order. Cardiologist mentioned he wanted echo done before appointment. I put order for complete echo with perflutren. Not limited study. Would you call cardilogist office talk with his nurse to verify this is correct order. I asked echo staff to call and coordinate with you. So would you call and confirm I ordered test like they wanted.

## 2015-12-31 ENCOUNTER — Encounter: Payer: Self-pay | Admitting: Medical

## 2016-01-01 NOTE — Telephone Encounter (Signed)
Penny [facility],  Pt appointment is on January 08, 2016 but echo is scheduled for Jan 09, 2016. Will you call patient and notify her. She is unaware test already scheduled or when. Cardiologist had mentioned they want it done before appointment. So is that ok with them.   Thanks,  Ramon DredgeEdward

## 2016-01-01 NOTE — Telephone Encounter (Signed)
Yes, ECHO is scheduled for 01/09/16

## 2016-01-08 ENCOUNTER — Ambulatory Visit (HOSPITAL_COMMUNITY): Payer: PRIVATE HEALTH INSURANCE | Attending: Cardiovascular Disease

## 2016-01-08 ENCOUNTER — Encounter: Payer: Self-pay | Admitting: Physician Assistant

## 2016-01-08 ENCOUNTER — Other Ambulatory Visit: Payer: Self-pay

## 2016-01-08 ENCOUNTER — Ambulatory Visit (INDEPENDENT_AMBULATORY_CARE_PROVIDER_SITE_OTHER): Payer: PRIVATE HEALTH INSURANCE | Admitting: Physician Assistant

## 2016-01-08 VITALS — BP 112/60 | HR 92 | Ht 66.0 in | Wt 131.8 lb

## 2016-01-08 DIAGNOSIS — R079 Chest pain, unspecified: Secondary | ICD-10-CM | POA: Diagnosis not present

## 2016-01-08 DIAGNOSIS — R0600 Dyspnea, unspecified: Secondary | ICD-10-CM | POA: Insufficient documentation

## 2016-01-08 DIAGNOSIS — R002 Palpitations: Secondary | ICD-10-CM

## 2016-01-08 DIAGNOSIS — R0789 Other chest pain: Secondary | ICD-10-CM

## 2016-01-08 LAB — ECHOCARDIOGRAM COMPLETE
HEIGHTINCHES: 66 in
WEIGHTICAEL: 2108.8 [oz_av]

## 2016-01-08 NOTE — Patient Instructions (Addendum)
Medication Instructions:  Your physician recommends that you continue on your current medications as directed. Please refer to the Current Medication list given to you today.  Labwork: NONE  Testing/Procedures: Your physician has requested that you have an exercise tolerance test. For further information please visit [email or web address] Please also follow instruction sheet, as given.  Follow-Up: AS NEEDED WITH DR. Katrinka Blazing AT THIS TIME  Any Other Special Instructions Will Be Listed Below (If Applicable).  If you need a refill on your cardiac medications before your next appointment, please call your pharmacy.

## 2016-01-08 NOTE — Progress Notes (Signed)
Cardiology Office Note:    Date:  01/08/2016   ID:  Penny Hansen, DOB 01-Mar-1986, MRN [id number]  PCP:  Piedad Climes, PA-C  Cardiologist:  New - seen by Dr. Verdis Prime today  Electrophysiologist:  Penny Hansen  Referring MD: Marisue Brooklyn   Chief Complaint  Patient presents with  . Chest Pain    History of Present Illness:    Penny Hansen is a 30 y.o. female with a hx of asthma, GERD.  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  She is currently taking classes at [employer] and wants to go to [employer]. She is referred by PCP for chest pain.  Her PCP did speak to Dr. Delane Ginger and an Echo was ordered but is pending.  When seen by her PCP on 12/27/15, a Troponin level was normal.  A CXR was normal.  O2 sats in the office were > 95% with ambulation.  Her ECG demonstrated no acute changes and was personally reviewed by me today.  Her chest symptoms occurred 2 weeks ago while sitting on her couch.  She stood to go to the bathroom and had 2-3 minutes of sharp L sided chest pain. She felt shortness of breath with this and noted dyspnea on exertion for the next 2 days.  She denies assoc symptoms. She denies nausea, diaphoresis, syncope.  She denies orthopnea, PND, edema.  She did take Flovent for a few days and felt better.  She denies a recurrence of chest pain or shortness of breath.  PAD Screen 01/08/2016  Previous PAD dx? No  Previous surgical procedure? No  Pain with walking? No  Feet/toe relief with dangling? No  Painful, non-healing ulcers? No  Extremities discolored? No    Prior CV studies that were reviewed today include:    None   Past Medical History:  Diagnosis Date  . Asthma   . GERD (gastroesophageal reflux disease)    silent GERD which has caused cough in the past    Past Surgical History:  Procedure Laterality Date  . TONSILECTOMY/ADENOIDECTOMY WITH MYRINGOTOMY  2012  . WISDOM TOOTH EXTRACTION  2010    Current Medications: Current  Meds  Medication Sig  . albuterol (PROVENTIL HFA;VENTOLIN HFA) 108 (90 Base) MCG/ACT inhaler Inhale 2 puffs into the lungs every 6 (six) hours as needed for wheezing or shortness of breath.  . Ascorbic Acid (VITAMIN C) 100 MG tablet Take 100 mg by mouth as directed.  . Multiple Vitamins-Minerals (MULTIVITAMIN GUMMIES WOMENS) CHEW Chew by mouth as directed.  . norethindrone-ethinyl estradiol (MICROGESTIN,JUNEL,LOESTRIN) 1-20 MG-MCG tablet Take 1 tablet by mouth daily.  Marland Kitchen omeprazole (PRILOSEC) 40 MG capsule Take 40 mg by mouth daily as needed (HEARTBURN / REFLUX).  . ranitidine (ZANTAC) 150 MG tablet Take 150 mg by mouth 2 (two) times daily.     Allergies:   Amoxicillin-pot clavulanate   Social History   Social History  . Marital status: Married    Spouse name: Penny Hansen  . Number of children: Penny Hansen  . Years of education: Penny Hansen   Social History Main Topics  . Smoking status: Never Smoker  . Smokeless tobacco: Never Used  . Alcohol use No  . Drug use: No  . Sexual activity: Yes    Birth control/ protection: Pill   Other Topics Concern  . 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[employer]   Married   No children   Moved to [place] in 2007  from [place]     Family History:  The patient's family history includes Breast cancer (age of onset: 57) in her paternal grandmother; Breast cancer (age of onset: 49) in her maternal grandmother; Cancer in her maternal grandmother and paternal grandmother; Diabetes in her paternal grandmother; Healthy in her sister; Heart attack in her maternal grandfather; Heart disease in her maternal grandfather; Hyperlipidemia in her father; Hypertension in her maternal grandfather; Prostate cancer in her paternal grandfather; Ulcerative colitis in her mother.   ROS:   Please see the history of present illness.    Review of Systems  Cardiovascular: Positive for chest pain.  Respiratory: Positive for shortness of breath.    All  other systems reviewed and are negative.   EKGs/Labs/Other Test Reviewed:    EKG:  EKG is not ordered today.  The ekg ordered today demonstrates Penny Hansen  Recent Labs: 09/07/2015: Hemoglobin 13.8; Platelets 283; TSH 1.10   Recent Lipid Panel No results found for: CHOL, TRIG, HDL, CHOLHDL, VLDL, LDLCALC, LDLDIRECT   Physical Exam:    VS:  BP 112/60   Pulse 92   Ht 5\' 6"  (1.676 m)   Wt 131 lb 12.8 oz (59.8 kg)   LMP 12/22/2015   BMI 21.27 kg/m     Wt Readings from Last 3 Encounters:  01/08/16 131 lb 12.8 oz (59.8 kg)  12/27/15 128 lb 3.2 oz (58.2 kg)  09/07/15 127 lb 8 oz (57.8 kg)     Physical Exam  Constitutional: She is oriented to person, place, and time. She appears well-developed and well-nourished. No distress.  HENT:  Head: Normocephalic and atraumatic.  Eyes: No scleral icterus.  Neck: Normal range of motion. No JVD present. Carotid bruit is not present.  Cardiovascular: Normal rate, regular rhythm, S1 normal, S2 normal and normal heart sounds.   No murmur heard. Pulmonary/Chest: Breath sounds normal. She has no wheezes. She has no rhonchi. She has no rales.  Abdominal: Soft. There is no tenderness.  Musculoskeletal: She exhibits no edema.  Neurological: She is alert and oriented to person, place, and time.  Skin: Skin is warm and dry.  Psychiatric: She has a normal mood and affect.    ASSESSMENT:    1. Other chest pain   2. Palpitations    PLAN:    In order of problems listed above:  1. Chest pain - Atypical and likely related to GERD or asthma.  ECG is normal.  Troponin was normal.  CXR was normal.  She has a low RF profile.  She did have associated palpitations. Patient was also seen by Dr. Verdis Prime (D[facility]) today.   -  Echocardiogram  -  ETT to rule out arrhythmia and ischemia  2. Palpitations - Increased HR may be more related to adrenaline surge from pain.  But, will do ETT to r/o arrhythmia and Echo to r/o structural heart disease.  If palpitations  recur, would rec 30 day event monitor.     Medication Adjustments/Labs and Tests Ordered: Current medicines are reviewed at length with the patient today.  Concerns regarding medicines are outlined above.  Medication changes, Labs and Tests ordered today are outlined in the Patient Instructions noted below. Patient Instructions  Medication Instructions:  Your physician recommends that you continue on your current medications as directed. Please refer to the Current Medication list given to you today.  Labwork: NONE  Testing/Procedures: Your physician has requested that you have an exercise tolerance test. For further information please visit [email or web address] Please also follow  instruction sheet, as given.  Follow-Up: AS NEEDED WITH DR. Katrinka BlazingSMITH AT THIS TIME  Any Other Special Instructions Will Be Listed Below (If Applicable).  If you need a refill on your cardiac medications before your next appointment, please call your pharmacy.   Signed, Tereso NewcomerScott Weaver, PA-C  01/08/2016 10:05 AM    [facility] [address], [place], [place]  [address] Phone: [phone]; Fax: [phone]   The patient has been seen in conjunction with Tereso NewcomerScott Weaver, PA-C. All aspects of care have been considered and discussed. The patient has been personally interviewed, examined, and all clinical data has been reviewed.   I believe the chest discomfort was likely inflammatory. The workup described above will exclude the possibility of any significant underlying cardiovascular problem. If any significant abnormalities, she will need to return for follow-up.

## 2016-01-09 ENCOUNTER — Other Ambulatory Visit (HOSPITAL_BASED_OUTPATIENT_CLINIC_OR_DEPARTMENT_OTHER): Payer: Self-pay

## 2016-01-14 ENCOUNTER — Encounter: Payer: Self-pay | Admitting: Physician Assistant

## 2016-01-22 ENCOUNTER — Telehealth: Payer: Self-pay | Admitting: Physician Assistant

## 2016-01-22 NOTE — Telephone Encounter (Signed)
Pt called wanting to switch [facility]  [place] is closer to [facility]

## 2016-01-22 NOTE — Telephone Encounter (Signed)
Fine with me

## 2016-01-23 NOTE — Telephone Encounter (Signed)
Left message asking pt to call office  °

## 2016-05-13 ENCOUNTER — Ambulatory Visit: Payer: Self-pay | Admitting: Primary Care

## 2016-06-02 ENCOUNTER — Ambulatory Visit (INDEPENDENT_AMBULATORY_CARE_PROVIDER_SITE_OTHER): Payer: PRIVATE HEALTH INSURANCE | Admitting: Primary Care

## 2016-06-02 ENCOUNTER — Encounter: Payer: Self-pay | Admitting: Primary Care

## 2016-06-02 DIAGNOSIS — J4520 Mild intermittent asthma, uncomplicated: Secondary | ICD-10-CM

## 2016-06-02 DIAGNOSIS — K219 Gastro-esophageal reflux disease without esophagitis: Secondary | ICD-10-CM

## 2016-06-02 NOTE — Assessment & Plan Note (Signed)
Mild, intermittent. Uses albuterol once every 1-2 months. Exam today unremarkable. Refills up-to-date.

## 2016-06-02 NOTE — Progress Notes (Signed)
Subjective:    Patient ID: Penny Hansen, female    DOB: 04-18-1985, 31 y.o.   MRN: [id number]  HPI  [name] is a 31 year old female who presents today to transfer care from [facility].  1) GERD: Currently managed on omeprazole 40 mg and ranitidine 150 mg BID. She takes the Zantac PRN for symptoms and omeprazole if no relief from Zantac. Omeprazole use is infrequent. Her symptoms of GERD include cough, sensation of reflux, occasional burning. Overeating, eating too fast, laying down after meals, and orange juice will trigger reflux. She is very aware of this and tries to prevent it when possible.    2) Asthma: Diagnosed during middle school years, mostly exercise induced. She denies wheezing, shortness of breath. She does experience coughing fits that will improve after albuterol. She will use her albuterol inhaler once every 1-2 months. No recent use. She is up-to-date on refills.   Review of Systems  Respiratory: Negative for shortness of breath and wheezing.   Cardiovascular: Negative for chest pain.  Gastrointestinal:       GERD symptoms       Past Medical History:  Diagnosis Date  . Asthma   . GERD (gastroesophageal reflux disease)    silent GERD which has caused cough in the past     Social History   Social History  . Marital status: Married    Spouse name: N/A  . Number of children: N/A  . Years of education: N/A   Occupational History  . Not on file.   Social History Main Topics  . Smoking status: Never Smoker  . Smokeless tobacco: Never Used  . Alcohol use No  . Drug use: No  . Sexual activity: Yes    Birth control/ protection: Pill   Other Topics Concern  . 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     Past Surgical History:  Procedure Laterality Date  . TONSILECTOMY/ADENOIDECTOMY WITH MYRINGOTOMY  2012    . WISDOM TOOTH EXTRACTION  2010    Family History  Problem Relation Age of Onset  . Ulcerative colitis Mother     Living  . Breast cancer Paternal Grandmother 25    breast cancer with mastectomy, chemo and radiation  . Diabetes Paternal Grandmother   . Cancer Paternal Grandmother     Breast  . Hyperlipidemia Father     Living  . Breast cancer Maternal Grandmother 45    breast cancer: lumpectomy  . Cancer Maternal Grandmother     Breast  . Heart disease Maternal Grandfather     MI; dissecting aneurysm  . Heart attack Maternal Grandfather   . Hypertension Maternal Grandfather   . Prostate cancer Paternal Grandfather   . Healthy Sister     x2    Allergies  Allergen Reactions  . Amoxicillin-Pot Clavulanate Other (See Comments)    Gi upset    Current Outpatient Prescriptions on File Prior to Visit  Medication Sig Dispense Refill  . albuterol (PROVENTIL HFA;VENTOLIN HFA) 108 (90 Base) MCG/ACT inhaler Inhale 2 puffs into the lungs every 6 (six) hours as needed for wheezing or shortness of breath. 1 Inhaler 2  . Ascorbic Acid (VITAMIN C) 100 MG tablet Take 100 mg by mouth as directed.    . Multiple Vitamins-Minerals (MULTIVITAMIN GUMMIES WOMENS) CHEW Chew by  mouth as directed.    . norethindrone-ethinyl estradiol (MICROGESTIN,JUNEL,LOESTRIN) 1-20 MG-MCG tablet Take 1 tablet by mouth daily.    . ranitidine (ZANTAC) 150 MG tablet Take 150 mg by mouth 2 (two) times daily.    Marland Kitchen omeprazole (PRILOSEC) 40 MG capsule Take 40 mg by mouth daily as needed (HEARTBURN / REFLUX).     No current facility-administered medications on file prior to visit.     BP 104/66   Pulse 82   Temp 98.3 F (36.8 C) (Oral)   Ht 5' 6.5" (1.689 m)   Wt 129 lb 12.8 oz (58.9 kg)   LMP 05/21/2016   SpO2 99%   BMI 20.64 kg/m    Objective:   Physical Exam  Constitutional: She appears well-nourished.  Neck: Neck supple.  Cardiovascular: Normal rate and regular rhythm.   Pulmonary/Chest: Effort  normal and breath sounds normal.  Skin: Skin is warm and dry.  Psychiatric: She has a normal mood and affect.          Assessment & Plan:

## 2016-06-02 NOTE — Assessment & Plan Note (Addendum)
Infrequent symptoms as she controls with prevention. Uses Zantac as first line, omeprazole if no relief. Uses both infrequently. Information provided regarding trigger foods.

## 2016-06-02 NOTE — Progress Notes (Signed)
Pre visit review using our clinic review tool, if applicable. No additional management support is needed unless otherwise documented below in the visit note. 

## 2016-06-02 NOTE — Patient Instructions (Signed)
Please schedule a physical with me in 2018. You may also schedule a lab only appointment 3-4 days prior. We will discuss your lab results in detail during your physical.  Please message me when you are needing refills of your medication for the first time.  It was a pleasure to meet you today! Please don't hesitate to call me with any questions. Welcome to [facility]!  Food Choices for Gastroesophageal Reflux Disease, Adult When you have gastroesophageal reflux disease (GERD), the foods you eat and your eating habits are very important. Choosing the right foods can help ease the discomfort of GERD. What general guidelines do I need to follow?  Choose fruits, vegetables, whole grains, low-fat dairy products, and low-fat meat, fish, and poultry.  Limit fats such as oils, salad dressings, butter, nuts, and avocado.  Keep a food diary to identify foods that cause symptoms.  Avoid foods that cause reflux. These may be different for different people.  Eat frequent small meals instead of three large meals each day.  Eat your meals slowly, in a relaxed setting.  Limit fried foods.  Cook foods using methods other than frying.  Avoid drinking alcohol.  Avoid drinking large amounts of liquids with your meals.  Avoid bending over or lying down until 2-3 hours after eating. What foods are not recommended? The following are some foods and drinks that may worsen your symptoms: Vegetables  Tomatoes. Tomato juice. Tomato and spaghetti sauce. Chili peppers. Onion and garlic. Horseradish. Fruits  Oranges, grapefruit, and lemon (fruit and juice). Meats  High-fat meats, fish, and poultry. This includes hot dogs, ribs, ham, sausage, salami, and bacon. Dairy  Whole milk and chocolate milk. Sour cream. Cream. Butter. Ice cream. Cream cheese. Beverages  Coffee and tea, with or without caffeine. Carbonated beverages or energy drinks. Condiments  Hot sauce. Barbecue sauce. Sweets/Desserts   Chocolate and cocoa. Donuts. Peppermint and spearmint. Fats and Oils  High-fat foods, including [place] fries and potato chips. Other  Vinegar. Strong spices, such as black pepper, white pepper, red pepper, cayenne, curry powder, cloves, ginger, and chili powder. The items listed above may not be a complete list of foods and beverages to avoid. Contact your dietitian for more information.  This information is not intended to replace advice given to you by your health care provider. Make sure you discuss any questions you have with your health care provider. Document Released: 03/24/2005 Document Revised: 08/30/2015 Document Reviewed: 01/26/2013 Elsevier Interactive Patient Education  2017 [employer].

## 2016-08-18 ENCOUNTER — Encounter: Payer: Self-pay | Admitting: Internal Medicine

## 2016-08-18 ENCOUNTER — Ambulatory Visit (INDEPENDENT_AMBULATORY_CARE_PROVIDER_SITE_OTHER): Payer: PRIVATE HEALTH INSURANCE | Admitting: Internal Medicine

## 2016-08-18 VITALS — BP 126/66 | HR 87 | Temp 98.9°F | Wt 133.0 lb

## 2016-08-18 DIAGNOSIS — J069 Acute upper respiratory infection, unspecified: Secondary | ICD-10-CM

## 2016-08-18 MED ORDER — HYDROCODONE-HOMATROPINE 5-1.5 MG/5ML PO SYRP: 5.0000 mL | ORAL_SOLUTION | Freq: Three times a day (TID) | ORAL | 0 refills | Status: DC | PRN

## 2016-08-18 MED ORDER — AZITHROMYCIN 250 MG PO TABS: ORAL_TABLET | ORAL | 0 refills | Status: DC

## 2016-08-18 MED ORDER — LEVOCETIRIZINE DIHYDROCHLORIDE 5 MG PO TABS: 5.0000 mg | ORAL_TABLET | Freq: Every evening | ORAL | 2 refills | Status: DC

## 2016-08-18 NOTE — Progress Notes (Signed)
HPI  Pt presents to the clinic today with c/o nasal congestion, sore throat, cough and chest congestion. This started 8 days ago. She is blowing green mucous out of her nose. She denies difficulty swallowing. The cough is non productive. She has run low grade fevers of 100.0. She denies chills or body aches. She has tried Xyzal, Tylenol and Mucinex with minimal relief. She has a history of seasonal allergies and asthma. She has had sick contacts as well.  Review of Systems        Past Medical History:  Diagnosis Date  . Asthma   . GERD (gastroesophageal reflux disease)    silent GERD which has caused cough in the past    Family History  Problem Relation Age of Onset  . Ulcerative colitis Mother        Living  . Breast cancer Paternal Grandmother 10       breast cancer with mastectomy, chemo and radiation  . Diabetes Paternal Grandmother   . Cancer Paternal Grandmother        Breast  . Hyperlipidemia Father        Living  . Breast cancer Maternal Grandmother 22       breast cancer: lumpectomy  . Cancer Maternal Grandmother        Breast  . Heart disease Maternal Grandfather        MI; dissecting aneurysm  . Heart attack Maternal Grandfather   . Hypertension Maternal Grandfather   . Prostate cancer Paternal Grandfather   . Healthy Sister        x2    Social History   Social History  . Marital status: Married    Spouse name: N/A  . Number of children: N/A  . Years of education: N/A   Occupational History  . Not on file.   Social History Main Topics  . Smoking status: Never Smoker  . Smokeless tobacco: Never Used  . Alcohol use No  . Drug use: No  . Sexual activity: Yes    Birth control/ protection: Pill   Other Topics Concern  . 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     Allergies  Allergen Reactions  .  Amoxicillin-Pot Clavulanate Other (See Comments)    Gi upset     Constitutional: Positive fever. Denies headache, fatigue or abrupt weight changes.  HEENT:  Positive nasal congestion, sore throat. Denies eye redness, eye pain, pressure behind the eyes, facial pain, ear pain, ringing in the ears, wax buildup, runny nose or bloody nose. Respiratory: Positive cough. Denies difficulty breathing or shortness of breath.  Cardiovascular: Denies chest pain, chest tightness, palpitations or swelling in the hands or feet.   No other specific complaints in a complete review of systems (except as listed in HPI above).  Objective:   BP 126/66   Pulse 87   Temp 98.9 F (37.2 C) (Oral)   Wt 133 lb (60.3 kg)   LMP 07/28/2016   SpO2 98%   BMI 21.15 kg/m  Wt Readings from Last 3 Encounters:  08/18/16 133 lb (60.3 kg)  06/02/16 129 lb 12.8 oz (58.9 kg)  01/08/16 131 lb 12.8 oz (59.8 kg)     General: Appears her stated age,  in NAD. HEENT: Head: normal shape and size, no sinus tenderness noted;  Ears: Tm's gray  and intact, normal light reflex; Nose: mucosa pink and moist, septum midline; Throat/Mouth: + PND. Teeth present, mucosa erythematous and moist, no exudate noted, no lesions or ulcerations noted.  Neck: No cervical lymphadenopathy.  Cardiovascular: Normal rate and rhythm.  Pulmonary/Chest: Normal effort and positive vesicular breath sounds. No respiratory distress. No wheezes, rales or ronchi noted.       Assessment & Plan:   Upper Respiratory Infection:  Get some rest and drink plenty of water Do salt water gargles for the sore throat Start Flonase OTC eRx for Azithromax x 5 days Rx for Hycodan cough syrup  RTC as needed or if symptoms persist.   Nicki Reaper, NP

## 2016-08-18 NOTE — Patient Instructions (Signed)
Upper Respiratory Infection, Adult Most upper respiratory infections (URIs) are caused by a virus. A URI affects the nose, throat, and upper air passages. The most common type of URI is often called "the common cold." Follow these instructions at home:  Take medicines only as told by your doctor.  Gargle warm saltwater or take cough drops to comfort your throat as told by your doctor.  Use a warm mist humidifier or inhale steam from a shower to increase air moisture. This may make it easier to breathe.  Drink enough fluid to keep your pee (urine) clear or pale yellow.  Eat soups and other clear broths.  Have a healthy diet.  Rest as needed.  Go back to work when your fever is gone or your doctor says it is okay.  You may need to stay home longer to avoid giving your URI to others.  You can also wear a face mask and wash your hands often to prevent spread of the virus.  Use your inhaler more if you have asthma.  Do not use any tobacco products, including cigarettes, chewing tobacco, or electronic cigarettes. If you need help quitting, ask your doctor. Contact a doctor if:  You are getting worse, not better.  Your symptoms are not helped by medicine.  You have chills.  You are getting more short of breath.  You have brown or red mucus.  You have yellow or brown discharge from your nose.  You have pain in your face, especially when you bend forward.  You have a fever.  You have puffy (swollen) neck glands.  You have pain while swallowing.  You have white areas in the back of your throat. Get help right away if:  You have very bad or constant:  Headache.  Ear pain.  Pain in your forehead, behind your eyes, and over your cheekbones (sinus pain).  Chest pain.  You have long-lasting (chronic) lung disease and any of the following:  Wheezing.  Long-lasting cough.  Coughing up blood.  A change in your usual mucus.  You have a stiff neck.  You have  changes in your:  Vision.  Hearing.  Thinking.  Mood. This information is not intended to replace advice given to you by your health care provider. Make sure you discuss any questions you have with your health care provider. Document Released: 09/10/2007 Document Revised: 11/25/2015 Document Reviewed: 06/29/2013 Elsevier Interactive Patient Education  2017 [employer]vier Inc.  

## 2016-08-27 ENCOUNTER — Encounter: Payer: Self-pay | Admitting: Internal Medicine

## 2016-08-27 NOTE — Telephone Encounter (Signed)
To treating provider

## 2016-08-28 MED ORDER — PREDNISONE 10 MG PO TABS: ORAL_TABLET | ORAL | 0 refills | Status: DC

## 2017-05-13 ENCOUNTER — Ambulatory Visit: Payer: Self-pay | Admitting: Primary Care

## 2017-05-13 ENCOUNTER — Encounter: Payer: Self-pay | Admitting: Primary Care

## 2017-05-13 ENCOUNTER — Ambulatory Visit: Payer: PRIVATE HEALTH INSURANCE | Admitting: Primary Care

## 2017-05-13 VITALS — BP 122/68 | HR 89 | Temp 98.5°F | Ht 66.5 in | Wt 129.2 lb

## 2017-05-13 DIAGNOSIS — F411 Generalized anxiety disorder: Secondary | ICD-10-CM

## 2017-05-13 MED ORDER — SERTRALINE HCL 25 MG PO TABS: 25.0000 mg | ORAL_TABLET | Freq: Every day | ORAL | 1 refills | Status: DC

## 2017-05-13 NOTE — Patient Instructions (Signed)
Start sertraline (Zoloft) 25 mg tablets for anxiety. Start by taking 1/2 tablet daily for 8 days, then increase to 1 full tablet thereafter.  Please schedule a follow up appointment in 6 weeks for re-evaluation, please call or message sooner with any problems.   It was a pleasure to see you today!

## 2017-05-13 NOTE — Progress Notes (Signed)
Subjective:    Patient ID: Penny Hansen, female    DOB: [birth year], 32 y.o.   MRN: [id number]  HPI  Penny Hansen is a 32 year old female with a history of anxiety and inattentiveness who presents today with a chief complaint of anxiety.   She will be moving to [facility] within the next several months, her husband moved out to [facility] in November 2018. Since he's moved she's felt increased symptoms of feeling anxious, unable to handle stress, feeling more pessimistic, daily worry. She she's working and trying to pack up her home by herself which has made everything more difficult. She is working on getting a job in [place] which has been a long process. She's not sure when she'll be able to join her husband, maybe in a few months. Symptoms have been going on for the last 4-5 months, worse recently.   GAD 7 score of 17 today. She denies feeling depressed as she knows that a lot of positive is coming after this move. She was once managed on Lexapro for inattentive behavoir, took it once, felt depressed the entire day and stopped taking.    Review of Systems  Constitutional: Positive for fatigue.  Respiratory: Negative for shortness of breath.   Cardiovascular: Negative for chest pain.  Neurological: Negative for dizziness and headaches.  Psychiatric/Behavioral: The patient is nervous/anxious.        See HPI       Past Medical History:  Diagnosis Date  . Asthma   . GERD (gastroesophageal reflux disease)    silent GERD which has caused cough in the past     Social History   Socioeconomic History  . Marital status: Married    Spouse name: Not on file  . Number of children: Not on file  . Years of education: Not on file  . Highest education level: Not on file  Social Needs  . Financial resource strain: Not on file  . Food insecurity - worry: Not on file  . Food insecurity - inability: Not on file  . Transportation needs - medical: Not on file  . Transportation needs -  non-medical: Not on file  Occupational History  . Not on file  Tobacco Use  . Smoking status: Never Smoker  . Smokeless tobacco: Never Used  Substance and Sexual Activity  . Alcohol use: No    Alcohol/week: 0.0 oz  . Drug use: No  . Sexual activity: Yes    Birth control/protection: Pill  Other Topics Concern  . Not on file  Social History Narrative   Works as Regulatory affairs officer at [employer]   Married   No children   Moved to [place] in 2007 from [place]   Enjoys relaxing.     Past Surgical History:  Procedure Laterality Date  . TONSILECTOMY/ADENOIDECTOMY WITH MYRINGOTOMY  2012  . WISDOM TOOTH EXTRACTION  2010    Family History  Problem Relation Age of Onset  . Ulcerative colitis Mother        Living  . Breast cancer Paternal Grandmother 51       breast cancer with mastectomy, chemo and radiation  . Diabetes Paternal Grandmother   . Cancer Paternal Grandmother        Breast  . Hyperlipidemia Father        Living  . Breast cancer Maternal Grandmother 43       breast cancer: lumpectomy  . Cancer Maternal Grandmother        Breast  .  Heart disease Maternal Grandfather        MI; dissecting aneurysm  . Heart attack Maternal Grandfather   . Hypertension Maternal Grandfather   . Prostate cancer Paternal Grandfather   . Healthy Sister        x2    Allergies  Allergen Reactions  . Amoxicillin-Pot Clavulanate Other (See Comments)    Gi upset    Current Outpatient Medications on File Prior to Visit  Medication Sig Dispense Refill  . albuterol (PROVENTIL HFA;VENTOLIN HFA) 108 (90 Base) MCG/ACT inhaler Inhale 2 puffs into the lungs every 6 (six) hours as needed for wheezing or shortness of breath. 1 Inhaler 2  . Ascorbic Acid (VITAMIN C) 100 MG tablet Take 100 mg by mouth as directed.    Marland Kitchen levocetirizine (XYZAL) 5 MG tablet Take 1 tablet (5 mg total) by mouth every evening. 30 tablet 2  . Multiple Vitamins-Minerals (MULTIVITAMIN GUMMIES WOMENS) CHEW Chew by  mouth as directed.    . norethindrone-ethinyl estradiol (MICROGESTIN,JUNEL,LOESTRIN) 1-20 MG-MCG tablet Take 1 tablet by mouth daily.    Marland Kitchen omeprazole (PRILOSEC) 40 MG capsule Take 40 mg by mouth daily as needed (HEARTBURN / REFLUX).    . ranitidine (ZANTAC) 150 MG tablet Take 150 mg by mouth 2 (two) times daily.     No current facility-administered medications on file prior to visit.     BP 122/68   Pulse 89   Temp 98.5 F (36.9 C) (Oral)   Ht 5' 6.5" (1.689 m)   Wt 129 lb 4 oz (58.6 kg)   LMP 05/11/2017   SpO2 99%   BMI 20.55 kg/m    Objective:   Physical Exam  Constitutional: She appears well-nourished.  Neck: Neck supple.  Cardiovascular: Normal rate and regular rhythm.  Pulmonary/Chest: Effort normal and breath sounds normal.  Skin: Skin is warm and dry.  Psychiatric: She has a normal mood and affect.  Tearful at times during HPI          Assessment & Plan:

## 2017-05-13 NOTE — Assessment & Plan Note (Signed)
History of anxiety before, increased symptoms for the past 4-5 months. GAD 7 score of 17 today.  Discussed several options for treatment, she doesn't feel as though she has time for therapy and has been talking with her family with some improvement. She is willing to try medication again. Rx for Zoloft 25 mg sent to pharmacy.  Patient is to take 1/2 tablet daily for 8 days, then advance to 1 full tablet thereafter. We discussed possible side effects of headache, GI upset, drowsiness, and SI/HI. If thoughts of SI/HI develop, we discussed to present to the emergency immediately. Patient verbalized understanding.   Follow up in 6 weeks for re-evaluation.

## 2017-05-18 ENCOUNTER — Encounter: Payer: Self-pay | Admitting: Primary Care

## 2017-08-08 ENCOUNTER — Other Ambulatory Visit: Payer: Self-pay | Admitting: Primary Care

## 2017-08-08 DIAGNOSIS — F411 Generalized anxiety disorder: Secondary | ICD-10-CM

## 2017-08-10 ENCOUNTER — Encounter (INDEPENDENT_AMBULATORY_CARE_PROVIDER_SITE_OTHER): Payer: Self-pay

## 2017-08-10 NOTE — Telephone Encounter (Signed)
Patient was taking 1/2 tablet. Will send my chart message for clarification. She may have moved to [facility] as well.

## 2017-08-10 NOTE — Telephone Encounter (Signed)
Ok to refill? Electronically refill request for sertraline (ZOLOFT) 25 MG tablet  Last prescribed on 05/13/2017. Last seen on 05/13/2017

## 2017-08-17 NOTE — Telephone Encounter (Signed)
Have not heard back from patient via my chart. Assume patient has moved to [facility] as this was the plan during her last visit.

## 2017-11-24 DIAGNOSIS — F411 Generalized anxiety disorder: Secondary | ICD-10-CM

## 2017-11-24 DIAGNOSIS — R110 Nausea: Secondary | ICD-10-CM

## 2017-11-24 MED ORDER — ONDANSETRON 4 MG PO TBDP: 4.0000 mg | ORAL_TABLET | Freq: Three times a day (TID) | ORAL | 0 refills | Status: DC | PRN

## 2017-11-24 MED ORDER — SERTRALINE HCL 25 MG PO TABS: 25.0000 mg | ORAL_TABLET | Freq: Every day | ORAL | 0 refills | Status: DC

## 2017-12-02 ENCOUNTER — Ambulatory Visit: Payer: Self-pay | Admitting: Primary Care

## 2018-01-05 ENCOUNTER — Ambulatory Visit: Payer: PRIVATE HEALTH INSURANCE | Admitting: Primary Care

## 2018-01-05 DIAGNOSIS — R110 Nausea: Secondary | ICD-10-CM

## 2018-01-05 DIAGNOSIS — K219 Gastro-esophageal reflux disease without esophagitis: Secondary | ICD-10-CM | POA: Diagnosis not present

## 2018-01-05 DIAGNOSIS — J4520 Mild intermittent asthma, uncomplicated: Secondary | ICD-10-CM

## 2018-01-05 DIAGNOSIS — F411 Generalized anxiety disorder: Secondary | ICD-10-CM | POA: Diagnosis not present

## 2018-01-05 MED ORDER — ONDANSETRON 4 MG PO TBDP: 4.0000 mg | ORAL_TABLET | Freq: Three times a day (TID) | ORAL | 0 refills | Status: DC | PRN

## 2018-01-05 NOTE — Patient Instructions (Signed)
Try to increase the sertraline (Zoloft) by taking 1.5 tablets daily for several weeks, then increase to 2 tablets from there. Please keep me updated as discussed.   Re-establish with your therapist.   It was a pleasure to see you today!

## 2018-01-05 NOTE — Progress Notes (Signed)
Subjective:    Patient ID: Penny Hansen, female    DOB: 09/18/1985, 32 y.o.   MRN: [id number]  HPI  Penny Hansen is a 32 year old female who presents today for follow up.  1) GAD: Currently managed on sertraline 25 mg daily which was resumed in late August 2019 for continued symptoms of anxiety.   Since resuming sertraline she's noticed improvement in anxiety but does continue to notice symptoms. Symptoms include feeling nauseated, feeling distracted, easily anxious. Symptoms mostly occur when she's contacted by her husband. She's currently separated from her husband and is having a hard time with this as he now lives in [place]. She's was following with a therapist over the Summer and found that to be effective, she does plan on re-establishing soon now that she has insurance again. She has since started working again and is doing better overall, but does think she could potentially feel better. She is worried about the upcoming holidays.   She does experience drowsiness and nausea on Zoloft, also noticed tremors temporarily with an increased dose of 25 mg from 12.5 mg. She did have a bad experience with Lexapro in the past with confusion, increased depression, felt not like herself. She would like a refill of her Zofran for intermittent nausea.   2) GERD: Currently managed on omeprazole 40 mg PRN. No recent use.   3) Asthma: Currently managed on albuterol HFA inhaler for which she uses PRN during colds or illness. No recent use.   Review of Systems  Respiratory: Negative for shortness of breath.   Cardiovascular: Negative for chest pain.  Gastrointestinal: Positive for nausea. Negative for vomiting.  Psychiatric/Behavioral:       See HPI       Past Medical History:  Diagnosis Date  . Asthma   . GERD (gastroesophageal reflux disease)    silent GERD which has caused cough in the past     Social History   Socioeconomic History  . Marital status: Married    Spouse name:  Not on file  . Number of children: Not on file  . Years of education: Not on file  . Highest education level: Not on file  Occupational History  . Not on file  Social Needs  . Financial resource strain: Not on file  . Food insecurity:    Worry: Not on file    Inability: Not on file  . Transportation needs:    Medical: Not on file    Non-medical: Not on file  Tobacco Use  . Smoking status: Never Smoker  . Smokeless tobacco: Never Used  Substance and Sexual Activity  . Alcohol use: No    Alcohol/week: 0.0 standard drinks  . Drug use: No  . Sexual activity: Yes    Birth control/protection: Pill  Lifestyle  . Physical activity:    Days per week: Not on file    Minutes per session: Not on file  . Stress: Not on file  Relationships  . Social connections:    Talks on phone: Not on file    Gets together: Not on file    Attends religious service: Not on file    Active member of club or organization: Not on file    Attends meetings of clubs or organizations: Not on file    Relationship status: Not on file  . Intimate partner violence:    Fear of current or ex partner: Not on file    Emotionally abused: Not on file  Physically abused: Not on file    Forced sexual activity: Not on file  Other Topics Concern  . 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     Past Surgical History:  Procedure Laterality Date  . TONSILECTOMY/ADENOIDECTOMY WITH MYRINGOTOMY  2012  . WISDOM TOOTH EXTRACTION  2010    Family History  Problem Relation Age of Onset  . Ulcerative colitis Mother        Living  . Breast cancer Paternal Grandmother 49       breast cancer with mastectomy, chemo and radiation  . Diabetes Paternal Grandmother   . Cancer Paternal Grandmother        Breast  . Hyperlipidemia Father        Living  . Breast cancer Maternal Grandmother 28        breast cancer: lumpectomy  . Cancer Maternal Grandmother        Breast  . Heart disease Maternal Grandfather        MI; dissecting aneurysm  . Heart attack Maternal Grandfather   . Hypertension Maternal Grandfather   . Prostate cancer Paternal Grandfather   . Healthy Sister        x2    Allergies  Allergen Reactions  . Amoxicillin-Pot Clavulanate Other (See Comments)    Gi upset    Current Outpatient Medications on File Prior to Visit  Medication Sig Dispense Refill  . albuterol (PROVENTIL HFA;VENTOLIN HFA) 108 (90 Base) MCG/ACT inhaler Inhale 2 puffs into the lungs every 6 (six) hours as needed for wheezing or shortness of breath. 1 Inhaler 2  . Ascorbic Acid (VITAMIN C) 100 MG tablet Take 100 mg by mouth as directed.    Marland Kitchen levocetirizine (XYZAL) 5 MG tablet Take 1 tablet (5 mg total) by mouth every evening. 30 tablet 2  . Multiple Vitamins-Minerals (MULTIVITAMIN GUMMIES WOMENS) CHEW Chew by mouth as directed.    . norethindrone-ethinyl estradiol (MICROGESTIN,JUNEL,LOESTRIN) 1-20 MG-MCG tablet Take 1 tablet by mouth daily.    Marland Kitchen omeprazole (PRILOSEC) 40 MG capsule Take 40 mg by mouth daily as needed (HEARTBURN / REFLUX).    . ranitidine (ZANTAC) 150 MG tablet Take 150 mg by mouth 2 (two) times daily.    . sertraline (ZOLOFT) 25 MG tablet Take 1 tablet (25 mg total) by mouth daily. 90 tablet 0   No current facility-administered medications on file prior to visit.     BP 108/74   Pulse 78   Temp 98.1 F (36.7 C) (Oral)   Ht 5' 6.5" (1.689 m)   Wt 127 lb 8 oz (57.8 kg)   LMP 12/20/2017   SpO2 99%   BMI 20.27 kg/m    Objective:   Physical Exam  Constitutional: She appears well-nourished.  Neck: Neck supple.  Cardiovascular: Normal rate and regular rhythm.  Respiratory: Effort normal and breath sounds normal.  Skin: Skin is warm and dry.  Psychiatric: She has a normal mood and affect.           Assessment & Plan:

## 2018-01-05 NOTE — Assessment & Plan Note (Signed)
Increased since a recent separation from her husband. Will have her try a very slow increase in her Zoloft from 25 mg to 50 mg. Directions provided. She will update if she experiences side effects. Consider Effexor if she cannot tolerate.

## 2018-01-05 NOTE — Assessment & Plan Note (Signed)
Using omeprazole PRN, continue same.

## 2018-01-05 NOTE — Assessment & Plan Note (Signed)
Overall stable, infrequent use of albuterol.

## 2018-02-16 ENCOUNTER — Ambulatory Visit: Payer: PRIVATE HEALTH INSURANCE | Admitting: Primary Care

## 2018-02-16 VITALS — BP 114/70 | HR 81 | Temp 98.3°F | Ht 66.5 in | Wt 134.2 lb

## 2018-02-16 DIAGNOSIS — J4520 Mild intermittent asthma, uncomplicated: Secondary | ICD-10-CM | POA: Diagnosis not present

## 2018-02-16 DIAGNOSIS — F411 Generalized anxiety disorder: Secondary | ICD-10-CM

## 2018-02-16 MED ORDER — ALBUTEROL SULFATE HFA 108 (90 BASE) MCG/ACT IN AERS: 2.0000 | INHALATION_SPRAY | RESPIRATORY_TRACT | 0 refills | Status: DC | PRN

## 2018-02-16 NOTE — Patient Instructions (Signed)
Shortness of Breath/Wheezing/Cough: Use the albuterol inhaler. Inhale 2 puffs into the lungs every 4 to 6 hours as needed for wheezing, cough, and/or shortness of breath.   Continue taking Amoxicillin as prescribed.  Please update me regarding your anxiety and depression symptoms as discussed.  It was a pleasure to see you today!

## 2018-02-16 NOTE — Progress Notes (Signed)
Subjective:    Patient ID: Penny Hansen, female    DOB: Apr 05, 1986, 32 y.o.   MRN: [id number]  HPI  [name] is a 32 year old female with a history of asthma who presents today for medication refill.   She is currently managed on albuterol HFA for which she uses PRN. She did an e-visit last week and was diagnosed with acute sinusitis. She's been taking amoxicillin and is doing better. Since then she's noticed a dry cough, particularly at night. She is out of her albuterol inhaler and would like a refill. Historically the albuterol has helped her to recover during acute illness.   She's since stopped taking her Zoloft due to recurrent diarrhea, nausea, and just didn't feel herself. Since stopping she's feeling "okay", some anxiety, some good days and bad. Her diarrhea has improved. She'd like to see how she does without her Zoloft.   Review of Systems  Constitutional: Negative for fever.  HENT: Positive for congestion and sinus pressure.   Respiratory: Positive for cough and shortness of breath. Negative for wheezing.   Cardiovascular: Negative for chest pain.  Psychiatric/Behavioral:       See HPI       Past Medical History:  Diagnosis Date  . Asthma   . GERD (gastroesophageal reflux disease)    silent GERD which has caused cough in the past     Social History   Socioeconomic History  . Marital status: Married    Spouse name: Not on file  . Number of children: Not on file  . Years of education: Not on file  . Highest education level: Not on file  Occupational History  . Not on file  Social Needs  . Financial resource strain: Not on file  . Food insecurity:    Worry: Not on file    Inability: Not on file  . Transportation needs:    Medical: Not on file    Non-medical: Not on file  Tobacco Use  . Smoking status: Never Smoker  . Smokeless tobacco: Never Used  Substance and Sexual Activity  . Alcohol use: No    Alcohol/week: 0.0 standard drinks  . Drug  use: No  . Sexual activity: Yes    Birth control/protection: Pill  Lifestyle  . Physical activity:    Days per week: Not on file    Minutes per session: Not on file  . Stress: Not on file  Relationships  . Social connections:    Talks on phone: Not on file    Gets together: Not on file    Attends religious service: Not on file    Active member of club or organization: Not on file    Attends meetings of clubs or organizations: Not on file    Relationship status: Not on file  . Intimate partner violence:    Fear of current or ex partner: Not on file    Emotionally abused: Not on file    Physically abused: Not on file    Forced sexual activity: Not on file  Other Topics Concern  . 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     Past Surgical History:  Procedure Laterality Date  . TONSILECTOMY/ADENOIDECTOMY WITH MYRINGOTOMY  2012  . WISDOM TOOTH EXTRACTION  2010    Family History  Problem Relation Age  of Onset  . Ulcerative colitis Mother        Living  . Breast cancer Paternal Grandmother 70       breast cancer with mastectomy, chemo and radiation  . Diabetes Paternal Grandmother   . Cancer Paternal Grandmother        Breast  . Hyperlipidemia Father        Living  . Breast cancer Maternal Grandmother 2       breast cancer: lumpectomy  . Cancer Maternal Grandmother        Breast  . Heart disease Maternal Grandfather        MI; dissecting aneurysm  . Heart attack Maternal Grandfather   . Hypertension Maternal Grandfather   . Prostate cancer Paternal Grandfather   . Healthy Sister        x2    Allergies  Allergen Reactions  . Amoxicillin-Pot Clavulanate Other (See Comments)    Gi upset    Current Outpatient Medications on File Prior to Visit  Medication Sig Dispense Refill  . Ascorbic Acid (VITAMIN C) 100 MG tablet Take 100 mg  by mouth as directed.    Marland Kitchen levocetirizine (XYZAL) 5 MG tablet Take 1 tablet (5 mg total) by mouth every evening. 30 tablet 2  . Multiple Vitamins-Minerals (MULTIVITAMIN GUMMIES WOMENS) CHEW Chew by mouth as directed.    . norethindrone-ethinyl estradiol (MICROGESTIN,JUNEL,LOESTRIN) 1-20 MG-MCG tablet Take 1 tablet by mouth daily.    Marland Kitchen omeprazole (PRILOSEC) 40 MG capsule Take 40 mg by mouth daily as needed (HEARTBURN / REFLUX).    . ranitidine (ZANTAC) 150 MG tablet Take 150 mg by mouth 2 (two) times daily.    . ondansetron (ZOFRAN ODT) 4 MG disintegrating tablet Take 1 tablet (4 mg total) by mouth every 8 (eight) hours as needed for nausea or vomiting. (Patient not taking: Reported on 02/16/2018) 30 tablet 0   No current facility-administered medications on file prior to visit.     BP 114/70   Pulse 81   Temp 98.3 F (36.8 C) (Oral)   Ht 5' 6.5" (1.689 m)   Wt 134 lb 4 oz (60.9 kg)   LMP 02/16/2018   SpO2 98%   BMI 21.34 kg/m    Objective:   Physical Exam  Constitutional: She appears well-nourished.  Neck: Neck supple.  Cardiovascular: Normal rate and regular rhythm.  Respiratory: Effort normal and breath sounds normal. She has no wheezes.  Skin: Skin is warm and dry.  Psychiatric: She has a normal mood and affect.           Assessment & Plan:

## 2018-02-16 NOTE — Assessment & Plan Note (Signed)
Refill provided for albuterol inhaler. No wheezing on exam. Discussed to notify if she continues to use albuterol more than three times weekly after her illness dissipates.

## 2018-02-16 NOTE — Assessment & Plan Note (Signed)
Discontinued Zoloft on her own due to side effects. Continue off of Zoloft. Consider switching to another SSRI if needed, she will update.

## 2018-03-01 ENCOUNTER — Other Ambulatory Visit: Payer: Self-pay | Admitting: Primary Care

## 2018-03-01 DIAGNOSIS — J4520 Mild intermittent asthma, uncomplicated: Secondary | ICD-10-CM

## 2018-03-23 ENCOUNTER — Ambulatory Visit: Payer: PRIVATE HEALTH INSURANCE | Admitting: Primary Care

## 2018-03-23 ENCOUNTER — Encounter: Payer: Self-pay | Admitting: Primary Care

## 2018-03-23 VITALS — BP 104/66 | HR 79 | Temp 98.6°F | Ht 66.5 in | Wt 132.0 lb

## 2018-03-23 DIAGNOSIS — J4521 Mild intermittent asthma with (acute) exacerbation: Secondary | ICD-10-CM

## 2018-03-23 DIAGNOSIS — J069 Acute upper respiratory infection, unspecified: Secondary | ICD-10-CM

## 2018-03-23 MED ORDER — BENZONATATE 200 MG PO CAPS: 200.0000 mg | ORAL_CAPSULE | Freq: Three times a day (TID) | ORAL | 0 refills | Status: DC | PRN

## 2018-03-23 MED ORDER — PREDNISONE 20 MG PO TABS: ORAL_TABLET | ORAL | 0 refills | Status: DC

## 2018-03-23 NOTE — Progress Notes (Signed)
Subjective:    Patient ID: Penny Hansen, female    DOB: 09-25-1985, 32 y.o.   MRN: [id number]  HPI  Penny Hansen is a 32 year old female with a history of asthma and GERD who presents today with a chief complaint of cough.  She also reports mild sinus pressure, ear fullness, chest tightness, mild shortness of breath, chest congestion. Her cough is productive with yellow sputum. Her symptoms began 6-7 days ago, overall feeling about the same. She's tried taking Alka-Selzer cough and cold, Dayquil/Nyquill, Emergen-C without much improvement. She used her albuterol once last night which made her feel jittery but helped with chest tightness. She denies fevers.   Review of Systems  Constitutional: Negative for fever.  HENT: Positive for congestion and sinus pressure. Negative for ear pain.        Ear fullness  Respiratory: Positive for cough, chest tightness and shortness of breath. Negative for wheezing.   Cardiovascular: Negative for chest pain.       Past Medical History:  Diagnosis Date  . Asthma   . GERD (gastroesophageal reflux disease)    silent GERD which has caused cough in the past     Social History   Socioeconomic History  . Marital status: Married    Spouse name: Not on file  . Number of children: Not on file  . Years of education: Not on file  . Highest education level: Not on file  Occupational History  . Not on file  Social Needs  . Financial resource strain: Not on file  . Food insecurity:    Worry: Not on file    Inability: Not on file  . Transportation needs:    Medical: Not on file    Non-medical: Not on file  Tobacco Use  . Smoking status: Never Smoker  . Smokeless tobacco: Never Used  Substance and Sexual Activity  . Alcohol use: No    Alcohol/week: 0.0 standard drinks  . Drug use: No  . Sexual activity: Yes    Birth control/protection: Pill  Lifestyle  . Physical activity:    Days per week: Not on file    Minutes per session: Not on  file  . Stress: Not on file  Relationships  . Social connections:    Talks on phone: Not on file    Gets together: Not on file    Attends religious service: Not on file    Active member of club or organization: Not on file    Attends meetings of clubs or organizations: Not on file    Relationship status: Not on file  . Intimate partner violence:    Fear of current or ex partner: Not on file    Emotionally abused: Not on file    Physically abused: Not on file    Forced sexual activity: Not on file  Other Topics Concern  . 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     Past Surgical History:  Procedure Laterality Date  . TONSILECTOMY/ADENOIDECTOMY WITH MYRINGOTOMY  2012  . WISDOM TOOTH EXTRACTION  2010    Family History  Problem Relation Age of Onset  . Ulcerative colitis Mother        Living  . Breast cancer Paternal Grandmother 8       breast cancer with mastectomy, chemo and radiation  .  Diabetes Paternal Grandmother   . Cancer Paternal Grandmother        Breast  . Hyperlipidemia Father        Living  . Breast cancer Maternal Grandmother 19       breast cancer: lumpectomy  . Cancer Maternal Grandmother        Breast  . Heart disease Maternal Grandfather        MI; dissecting aneurysm  . Heart attack Maternal Grandfather   . Hypertension Maternal Grandfather   . Prostate cancer Paternal Grandfather   . Healthy Sister        x2    Allergies  Allergen Reactions  . Amoxicillin-Pot Clavulanate Other (See Comments)    Gi upset    Current Outpatient Medications on File Prior to Visit  Medication Sig Dispense Refill  . albuterol (PROVENTIL HFA;VENTOLIN HFA) 108 (90 Base) MCG/ACT inhaler Inhale 2 puffs into the lungs every 4 (four) hours as needed for wheezing or shortness of breath. 1 Inhaler 0  . Ascorbic Acid (VITAMIN C) 100 MG  tablet Take 100 mg by mouth as directed.    Marland Kitchen levocetirizine (XYZAL) 5 MG tablet Take 1 tablet (5 mg total) by mouth every evening. 30 tablet 2  . Multiple Vitamins-Minerals (MULTIVITAMIN GUMMIES WOMENS) CHEW Chew by mouth as directed.    . norethindrone-ethinyl estradiol (MICROGESTIN,JUNEL,LOESTRIN) 1-20 MG-MCG tablet Take 1 tablet by mouth daily.    Marland Kitchen omeprazole (PRILOSEC) 40 MG capsule Take 40 mg by mouth daily as needed (HEARTBURN / REFLUX).    Marland Kitchen ondansetron (ZOFRAN ODT) 4 MG disintegrating tablet Take 1 tablet (4 mg total) by mouth every 8 (eight) hours as needed for nausea or vomiting. 30 tablet 0  . ranitidine (ZANTAC) 150 MG tablet Take 150 mg by mouth 2 (two) times daily.     No current facility-administered medications on file prior to visit.     BP 104/66   Pulse 79   Temp 98.6 F (37 C) (Oral)   Ht 5' 6.5" (1.689 m)   Wt 132 lb (59.9 kg)   LMP 03/21/2018   SpO2 97%   BMI 20.99 kg/m    Objective:   Physical Exam  Constitutional: She appears well-nourished. She does not appear ill.  HENT:  Right Ear: Tympanic membrane and ear canal normal.  Left Ear: Tympanic membrane and ear canal normal.  Nose: Mucosal edema present. Right sinus exhibits no maxillary sinus tenderness and no frontal sinus tenderness. Left sinus exhibits no maxillary sinus tenderness and no frontal sinus tenderness.  Mouth/Throat: Oropharynx is clear and moist.  Neck: Neck supple.  Cardiovascular: Normal rate and regular rhythm.  Respiratory: Effort normal and breath sounds normal. She has no wheezes.  Dry cough during exam. Mild chest tightness noted.  Skin: Skin is warm and dry.           Assessment & Plan:  URI vs Acute Asthma Exacerbation:  Cough, congestion x 6-7 days. Overall about the same with OTC treatment. Exam today with overall clear lungs, mild tightness, dry cough. Do suspect viral involvement vs asthma exacerbation. No evidence to suggest bacterial involvement including  pneumonia at this point. Will treat with prednisone burst x 5 days, albuterol inhaler PRN, and Tessalon Perles, Mucinex, fluids, rest. She will update Friday this week if symptoms do not improve.  Doreene Nest, NP

## 2018-03-23 NOTE — Patient Instructions (Signed)
Your symptoms are representative of a viral illness which will resolve on its own over time. Our goal is to treat your symptoms in order to aid your body in the healing process and to make you more comfortable.   You may take Benzonatate capsules for cough. Take 1 capsule by mouth three times daily as needed for cough.  Shortness of Breath/Wheezing/Cough: Use the albuterol inhaler. Inhale 2 puffs into the lungs every 4 to 6 hours as needed for wheezing, cough, and/or shortness of breath.   Start prednisone. Take 2 tablets daily for 5 days.  Chest Congestion: Try taking plain Mucinex. This will help loosen up the mucous in your chest. Ensure you take this medication with a full glass of water.  Please message me Friday this week if no improvement.  It was a pleasure to see you today!

## 2018-03-24 ENCOUNTER — Other Ambulatory Visit: Payer: Self-pay | Admitting: *Deleted

## 2018-09-16 ENCOUNTER — Other Ambulatory Visit: Payer: Self-pay

## 2018-09-16 ENCOUNTER — Ambulatory Visit: Payer: PRIVATE HEALTH INSURANCE | Admitting: Primary Care

## 2018-09-16 ENCOUNTER — Encounter: Payer: Self-pay | Admitting: Primary Care

## 2018-09-16 VITALS — BP 120/82 | HR 84 | Temp 98.1°F | Ht 66.0 in | Wt 136.8 lb

## 2018-09-16 DIAGNOSIS — K219 Gastro-esophageal reflux disease without esophagitis: Secondary | ICD-10-CM

## 2018-09-16 DIAGNOSIS — R110 Nausea: Secondary | ICD-10-CM | POA: Diagnosis not present

## 2018-09-16 DIAGNOSIS — R9089 Other abnormal findings on diagnostic imaging of central nervous system: Secondary | ICD-10-CM

## 2018-09-16 DIAGNOSIS — F411 Generalized anxiety disorder: Secondary | ICD-10-CM

## 2018-09-16 MED ORDER — ONDANSETRON 4 MG PO TBDP: 4.0000 mg | ORAL_TABLET | Freq: Three times a day (TID) | ORAL | 0 refills | Status: DC | PRN

## 2018-09-16 NOTE — Patient Instructions (Signed)
You will be contacted regarding your referral to Neurology.  Please let us know if you have not been contacted within one week.   Use the Zofran as needed for anxiety and nausea.  It was a pleasure to see you today!

## 2018-09-16 NOTE — Assessment & Plan Note (Signed)
Improved with avoid trigger foods. Currently not on medication.

## 2018-09-16 NOTE — Assessment & Plan Note (Signed)
Increased anxiety since recent MRI results. Agree to refill Zofran to use for physical anxiety symptoms.  She will keep me updated. Continue off of Zoloft.

## 2018-09-16 NOTE — Progress Notes (Signed)
Subjective:    Patient ID: Penny Hansen, female    DOB: 10/11/1985, 33 y.o.   MRN: [id number]  HPI  [name] is a 33 year old female who presents today to discuss recent MRI results.   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 After the imaging her results showed several abnormal "white patchy" spots in several regions of the brain. She met with one of the radiology neurologists at work (unofficially) who suggested she meet with a neurologist to officially discuss.   She denies a history of meningitis, inflammatory disease, head trauma, chronic migraines. Several years ago she had tingling to the mid thoracic back with radiation to right lateral breast. This lasted for several days. She hasn't experienced those symptoms since. She does have intermittent hip stiffness without weakness to her lower extremities. Now she denies numbness/tingling, weakness, neck pain, visual changes, fatigue.   She has been experiencing increased anxiety since the testing was preformed. She has been using Zofran infrequently for nausea which has helped with anxiety symptoms.    Review of Systems  Constitutional: Negative for fatigue and fever.  Respiratory: Negative for shortness of breath.   Cardiovascular: Negative for chest pain.  Neurological: Negative for dizziness, speech difficulty, weakness, numbness and headaches.  Psychiatric/Behavioral: The patient is nervous/anxious.        Past Medical History:  Diagnosis Date  . Asthma   . GERD (gastroesophageal reflux disease)    silent GERD which has caused cough in the past     Social History   Socioeconomic History  . Marital status: Married    Spouse name: Not on file  . Number of children: Not on file  . Years of education: Not on file  . Highest education level: Not on file  Occupational History  . Not on file  Social Needs  . Financial resource strain: Not on  file  . Food insecurity    Worry: Not on file    Inability: Not on file  . Transportation needs    Medical: Not on file    Non-medical: Not on file  Tobacco Use  . Smoking status: Never Smoker  . Smokeless tobacco: Never Used  Substance and Sexual Activity  . Alcohol use: No    Alcohol/week: 0.0 standard drinks  . Drug use: No  . Sexual activity: Yes    Birth control/protection: Pill  Lifestyle  . Physical activity    Days per week: Not on file    Minutes per session: Not on file  . Stress: Not on file  Relationships  . Social Herbalist on phone: Not on file    Gets together: Not on file    Attends religious service: Not on file    Active member of club or organization: Not on file    Attends meetings of clubs or organizations: Not on file    Relationship status: Not on file  . Intimate partner violence    Fear of current or ex partner: Not on file    Emotionally abused: Not on file    Physically abused: Not on file    Forced sexual activity: Not on file  Other Topics Concern  . Not on file  Social History Narrative   Works as Research officer, political party at [employer]   Married   No children   [place] to [place] in 2007 from [place]  Enjoys relaxing.     Past Surgical History:  Procedure Laterality Date  . TONSILECTOMY/ADENOIDECTOMY WITH MYRINGOTOMY  2012  . WISDOM TOOTH EXTRACTION  2010    Family History  Problem Relation Age of Onset  . Ulcerative colitis Mother        Living  . Breast cancer Paternal Grandmother 79       breast cancer with mastectomy, chemo and radiation  . Diabetes Paternal Grandmother   . Cancer Paternal Grandmother        Breast  . Hyperlipidemia Father        Living  . Breast cancer Maternal Grandmother 25       breast cancer: lumpectomy  . Cancer Maternal Grandmother        Breast  . Heart disease Maternal Grandfather        MI; dissecting aneurysm  . Heart attack Maternal Grandfather   . Hypertension Maternal  Grandfather   . Prostate cancer Paternal Grandfather   . Healthy Sister        x2    Allergies  Allergen Reactions  . Amoxicillin-Pot Clavulanate Other (See Comments)    Gi upset    Current Outpatient Medications on File Prior to Visit  Medication Sig Dispense Refill  . albuterol (PROVENTIL HFA;VENTOLIN HFA) 108 (90 Base) MCG/ACT inhaler Inhale 2 puffs into the lungs every 4 (four) hours as needed for wheezing or shortness of breath. 1 Inhaler 0  . Ascorbic Acid (VITAMIN C) 100 MG tablet Take 100 mg by mouth as directed.    . Multiple Vitamins-Minerals (MULTIVITAMIN GUMMIES WOMENS) CHEW Chew by mouth as directed.    . Norgestimate-Ethinyl Estradiol Triphasic (TRI-VYLIBRA) 0.18/0.215/0.25 MG-35 MCG tablet Tri-VyLibra (28) 0.18 mg(7)/0.215 mg(7)/0.25 mg(7)-35 mcg tablet  TAKE 1 TABLET BY MOUTH EVERY DAY     No current facility-administered medications on file prior to visit.     BP 120/82   Pulse 84   Temp 98.1 F (36.7 C) (Tympanic)   Ht _0  (1.676 m)   Wt 136 lb 12 oz (62 kg)   LMP 09/06/2018   SpO2 98%   BMI 22.07 kg/m    Objective:   Physical Exam  Constitutional: She is oriented to person, place, and time. She appears well-nourished.  Eyes: Pupils are equal, round, and reactive to light. EOM are normal.  Neurological: She is alert and oriented to person, place, and time. No cranial nerve deficit. Coordination normal.  Skin: Skin is warm and dry.  Psychiatric: She has a normal mood and affect.           Assessment & Plan:

## 2018-09-16 NOTE — Assessment & Plan Note (Signed)
Incidental finding on recent MRI of the brain for research study. Asymptomatic. Urgent referral placed to neurology for evaluation and repeat imaging.  Neuro exam today unremarkable.

## 2018-09-20 ENCOUNTER — Ambulatory Visit (INDEPENDENT_AMBULATORY_CARE_PROVIDER_SITE_OTHER): Payer: PRIVATE HEALTH INSURANCE | Admitting: Neurology

## 2018-09-20 ENCOUNTER — Encounter: Payer: Self-pay | Admitting: Neurology

## 2018-09-20 ENCOUNTER — Telehealth: Payer: Self-pay | Admitting: Neurology

## 2018-09-20 ENCOUNTER — Other Ambulatory Visit: Payer: Self-pay

## 2018-09-20 ENCOUNTER — Encounter

## 2018-09-20 VITALS — BP 121/78 | HR 89 | Temp 96.2°F | Ht 67.0 in | Wt 139.5 lb

## 2018-09-20 DIAGNOSIS — R9089 Other abnormal findings on diagnostic imaging of central nervous system: Secondary | ICD-10-CM | POA: Diagnosis not present

## 2018-09-20 DIAGNOSIS — R200 Anesthesia of skin: Secondary | ICD-10-CM | POA: Diagnosis not present

## 2018-09-20 HISTORY — DX: Anesthesia of skin: R20.0

## 2018-09-20 NOTE — Progress Notes (Signed)
[facility]  PATIENT: Penny Hansen DOB: 11/08/1985  REFERRING DOCTOR OR PCP: Vernona RiegerKatherine Clark, NP SOURCE: Patient, notes from PCP, MRI images personally reviewed.  _________________________________   HISTORICAL  CHIEF COMPLAINT:  Chief Complaint  Patient presents with   New Patient (Initial Visit)    RM 13, alone. Internal referral for abnormal MRI findings.     HISTORY OF PRESENT ILLNESS:  I had the pleasure of seeing your patient, Penny Hansen, at the [facility] for neurologic consultation regarding her abnormal brain MRI.  She is a 33 year old woman who was found to have an abnormal brain MRI 09/13/2018.  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   The safety read was abnormal showing that she had multiple subcortical and periventricular foci.   The study was done without contrast.  Her father is an orthopedic surgeon and showed the MRI to Dr. Olin PiaYapundich (Neurology) [place] who recommended [facility] me for further evaluation.  Back in 2014, she had some tingling in the right flank that lasted about 3 days.   She was working long shifts so assumed it was a muscle strain.   She had no other symptoms at that time.  Since she improved after a few days she did not seek medical attention.  She has not had any recurrence.  She does not note any numbness or dysesthesia this time.  In 2017, she was seen by primary care for fatigue.  She has no problems with gait, balance or strength.     She has no difficulty with bladder function.   Besides correction requiring contact lenses, she notes no visual issues.    She denies diplopia or vertigo.  She has not had any significant issues with fatigue and denies cognitive dysfunction or mood disturbance.  She does not have migraine headaches or any known cardiac disease..   She has no FH of MS.      I personally reviewed the  MRI of the brain from 09/13/2018 showed 3 periventricular (right posterior horn, left posterior horn and right temporal horn and 4 subcortical (largest right mesial temporal, one right frontal and 2 left temporoparietal).  Maybe another punctate left mesial frontal.  Also there is a left cerebellar focus vs.artifact.     REVIEW OF SYSTEMS: Constitutional: No fevers, chills, sweats, or change in appetite Eyes: No visual changes, double vision, eye pain Ear, nose and throat: No hearing loss, ear pain, nasal congestion, sore throat Cardiovascular: No chest pain, palpitations Respiratory: No shortness of breath at rest or with exertion.   No wheezes GastrointestinaI: No nausea, vomiting, diarrhea, abdominal pain, fecal incontinence Genitourinary: No dysuria, urinary retention or frequency.  No nocturia. Musculoskeletal: No neck pain, back pain Integumentary: No rash, pruritus, skin lesions Neurological: as above Psychiatric: No depression at this time.  No anxiety Endocrine: No palpitations, diaphoresis, change in appetite, change in weigh or increased thirst Hematologic/Lymphatic: No anemia, purpura, petechiae. Allergic/Immunologic: No itchy/runny eyes, nasal congestion, recent allergic reactions, rashes  ALLERGIES: Allergies  Allergen Reactions   Amoxicillin-Pot Clavulanate Other (See Comments)    Gi upset    HOME MEDICATIONS:  Current Outpatient Medications:    albuterol (PROVENTIL HFA;VENTOLIN HFA) 108 (90 Base) MCG/ACT inhaler, Inhale 2 puffs into the lungs every 4 (four) hours as needed for wheezing or shortness of breath., Disp: 1 Inhaler, Rfl: 0   Ascorbic Acid (VITAMIN C) 100 MG tablet,  Take 100 mg by mouth as directed., Disp: , Rfl:    Multiple Vitamins-Minerals (MULTIVITAMIN GUMMIES WOMENS) CHEW, Chew by mouth as directed., Disp: , Rfl:    Norgestimate-Ethinyl Estradiol Triphasic (TRI-VYLIBRA) 0.18/0.215/0.25 MG-35 MCG tablet, Tri-VyLibra (28) 0.18 mg(7)/0.215 mg(7)/0.25  mg(7)-35 mcg tablet  TAKE 1 TABLET BY MOUTH EVERY DAY, Disp: , Rfl:    ondansetron (ZOFRAN ODT) 4 MG disintegrating tablet, Take 1 tablet (4 mg total) by mouth every 8 (eight) hours as needed for nausea or vomiting., Disp: 30 tablet, Rfl: 0  PAST MEDICAL HISTORY: Past Medical History:  Diagnosis Date   Asthma    GERD (gastroesophageal reflux disease)    silent GERD which has caused cough in the past    PAST SURGICAL HISTORY: Past Surgical History:  Procedure Laterality Date   TONSILECTOMY/ADENOIDECTOMY WITH MYRINGOTOMY  2012   WISDOM TOOTH EXTRACTION  2010    FAMILY HISTORY: Family History  Problem Relation Age of Onset   Ulcerative colitis Mother        Living   Breast cancer Paternal Grandmother 43       breast cancer with mastectomy, chemo and radiation   Diabetes Paternal Grandmother    Cancer Paternal Grandmother        Breast   Hyperlipidemia Father        Living   Breast cancer Maternal Grandmother 32       breast cancer: lumpectomy   Cancer Maternal Grandmother        Breast   Heart disease Maternal Grandfather        MI; dissecting aneurysm   Heart attack Maternal Grandfather    Hypertension Maternal Grandfather    Prostate cancer Paternal Grandfather    Healthy Sister        x2    SOCIAL HISTORY:  Social History   Socioeconomic History   Marital status: Married    Spouse name: Not on file   Number of children: 0   Years of education: Bachelors   Highest education level: Not on file  Occupational History   Occupation: [employer]  Social Needs   Financial resource strain: Not on file   Food insecurity    Worry: Not on file    Inability: Not on file   Transportation needs    Medical: Not on file    Non-medical: Not on file  Tobacco Use   Smoking status: Never Smoker   Smokeless tobacco: Never Used  Substance and Sexual Activity   Alcohol use: Yes    Alcohol/week: 0.0 standard drinks    Comment: rare    Drug use: No   Sexual activity: Yes    Birth control/protection: Pill  Lifestyle   Physical activity    Days per week: Not on file    Minutes per session: Not on file   Stress: Not on file  Relationships   Social connections    Talks on phone: Not on file    Gets together: Not on file    Attends religious service: Not on file    Active member of club or organization: Not on file    Attends meetings of clubs or organizations: Not on file    Relationship status: Not on file   Intimate partner violence    Fear of current or ex partner: Not on file    Emotionally abused: Not on file    Physically abused: Not on file    Forced sexual activity: Not on file  Other Topics Concern  Not on file  Social History Narrative   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    Right handed    Caffeine use: Coffee every day   Tea very rare     PHYSICAL EXAM  Vitals:   09/20/18 1448  BP: 121/78  Pulse: 89  Temp: (!) 96.2 F (35.7 C)  Weight: 139 lb 8 oz (63.3 kg)  Height: 5\' 7"  (1.702 m)    Body mass index is 21.85 kg/m.   General: The patient is well-developed and well-nourished and in no acute distress  HEENT:  Head is [place]/AT.  Sclera are anicteric.  Funduscopic exam shows normal optic discs and retinal vessels.  Neck: No carotid bruits are noted.  The neck is nontender.  Cardiovascular: The heart has a regular rate and rhythm with a normal S1 and S2. There were no murmurs, gallops or rubs.    Skin: Extremities are without rash or  edema.  Musculoskeletal:  Back is nontender  Neurologic Exam  Mental status: The patient is alert and oriented x 3 at the time of the examination. The patient has apparent normal recent and remote memory, with an apparently normal attention span and concentration ability.   Speech is normal.  Cranial nerves: Extraocular movements are full. Pupils  are equal, round, and reactive to light and accomodation.  Visual fields are full.  Facial symmetry is present. There is good facial sensation to soft touch bilaterally.Facial strength is normal.  Trapezius and sternocleidomastoid strength is normal. No dysarthria is noted.  The tongue is midline, and the patient has symmetric elevation of the soft palate. No obvious hearing deficits are noted.  Motor:  Muscle bulk is normal.   Tone is normal. Strength is  5 / 5 in all 4 extremities.   Sensory: Sensory testing is intact to pinprick, soft touch and vibration sensation in all 4 extremities.  Coordination: Cerebellar testing reveals good finger-nose-finger and heel-to-shin bilaterally.  Gait and station: Station is normal.   Gait is normal. Tandem gait is normal. Romberg is negative.   Reflexes: Deep tendon reflexes are symmetric and normal bilaterally.   Plantar responses are flexor.      ASSESSMENT AND PLAN  1. Abnormal finding on MRI of brain   2. Numbness     In summary, Ms. Marinus Mawimiento is a 33 year old woman who has a history of several days of right flank numbness in 2014 who was noted on a brain MRI to have abnormal foci, potentially worrisome for multiple sclerosis.  I had a long discussion with her about the significance of the MRI findings.  She has several periventricular foci as well as several subcortical foci, 1 of which is juxtacortical.  The foci of fairly nonspecific though could be consistent with demyelination in the pattern could be seen with radiologic isolated syndrome.  With the numbness 6 years ago only lasting 3 days, it is difficult to know if that could be due to another CNS focus or have been incidental.  To better characterize, I will check an MRI of the brain with and without contrast to determine if there are any acute findings and also to get a better view with sagittal FLAIR images.  Additionally we will check noncontrasted MRI of the cervical and thoracic spine to  determine if there is any evidence of prior inflammatory/demyelinating myelitis.  ANA and ANCA will also be  checked to determine if there is any evidence of vasculitis.  I also discussed with her that the brain lesions could be due to a cardiogenic source and we will check an echocardiogram with bubble contrast to evaluate the valves and determine if there is any PFO or ASD.  She will return to see me in 6 months to be reevaluated but call sooner if she has new or worsening neurologic symptoms.  If the above evaluation is negative, I would still want to check an MRI of the brain in 6 to 12 months to determine if there has been change over time as would be expected with multiple sclerosis.  Thank you for asking me to see Ms. Tester.  Please let me know if I can be of further assistance with her or other patients in the future.   Mariyana Fulop A. Felecia Shelling, MD, [facility] 09/09/5407, 8:11 PM Certified in Neurology, Clinical Neurophysiology, Sleep Medicine and Neuroimaging  [facility] [address] [place], [place] [address] [phone]

## 2018-09-20 NOTE — Telephone Encounter (Signed)
Penny Hansen, can you please schedule this patient for a 6 month follow-up? She had labs done and is aware she will be called to schedule her MRI and Echocardiogram. Thanks!

## 2018-09-21 ENCOUNTER — Telehealth: Payer: Self-pay | Admitting: Neurology

## 2018-09-21 NOTE — Telephone Encounter (Signed)
[employer] order sent to GI. They will obtain the auth and reach out to the patient to schedule.  

## 2018-09-23 ENCOUNTER — Telehealth: Payer: Self-pay | Admitting: *Deleted

## 2018-09-23 LAB — ANA W/REFLEX: Anti Nuclear Antibody (ANA): NEGATIVE

## 2018-09-23 LAB — PAN-ANCA
ANCA Proteinase 3: <3.5 U/mL (ref 0.0–3.5)
Atypical pANCA: <1:20 {titer}
C-ANCA: <1:20 {titer}
Myeloperoxidase Ab: <9 U/mL (ref 0.0–9.0)
P-ANCA: <1:20 {titer}

## 2018-09-23 NOTE — Telephone Encounter (Signed)
Sent pt mychart message about results.  

## 2018-09-23 NOTE — Telephone Encounter (Signed)
-----   Message from Britt Bottom, MD sent at 09/23/2018 12:24 PM EDT ----- Please let the patient know that the lab work is fine.

## 2018-09-29 ENCOUNTER — Telehealth (HOSPITAL_COMMUNITY): Payer: Self-pay | Admitting: *Deleted

## 2018-09-29 NOTE — Telephone Encounter (Signed)

## 2018-09-30 ENCOUNTER — Other Ambulatory Visit: Payer: Self-pay

## 2018-09-30 ENCOUNTER — Ambulatory Visit (HOSPITAL_COMMUNITY): Payer: PRIVATE HEALTH INSURANCE | Attending: Cardiology

## 2018-09-30 DIAGNOSIS — R9089 Other abnormal findings on diagnostic imaging of central nervous system: Secondary | ICD-10-CM | POA: Diagnosis not present

## 2018-09-30 DIAGNOSIS — R200 Anesthesia of skin: Secondary | ICD-10-CM | POA: Diagnosis not present

## 2018-10-04 ENCOUNTER — Telehealth: Payer: Self-pay | Admitting: Neurology

## 2018-10-04 DIAGNOSIS — R9089 Other abnormal findings on diagnostic imaging of central nervous system: Secondary | ICD-10-CM

## 2018-10-04 DIAGNOSIS — R200 Anesthesia of skin: Secondary | ICD-10-CM

## 2018-10-04 NOTE — Telephone Encounter (Signed)
I spoke to [facility] about the echocardiogram.  The heart valves were fine.  She did have some bubbles across late after the injection.  This o is more likely to be due to pulmonary shunt/AVM then PFO/ASD.  She does not think Valsalva was done.  I discussed the significance.  I would like her to get a transcranial Doppler with bubble contrast.  If some bubbles do cross then I would want her to take aspirin.  We also discussed stopping the birth control pills until after this test is done.  She is scheduled to have the MRI of the spinal cord in a couple weeks.

## 2018-10-07 ENCOUNTER — Ambulatory Visit
Admission: RE | Admit: 2018-10-07 | Discharge: 2018-10-07 | Disposition: A | Discharge status: Home / Self Care | Payer: PRIVATE HEALTH INSURANCE | Source: Ambulatory Visit | Attending: Neurology | Admitting: Neurology

## 2018-10-07 ENCOUNTER — Other Ambulatory Visit: Payer: Self-pay

## 2018-10-07 DIAGNOSIS — R200 Anesthesia of skin: Secondary | ICD-10-CM | POA: Diagnosis not present

## 2018-10-07 DIAGNOSIS — R9089 Other abnormal findings on diagnostic imaging of central nervous system: Secondary | ICD-10-CM | POA: Diagnosis not present

## 2018-10-11 ENCOUNTER — Telehealth: Payer: Self-pay | Admitting: *Deleted

## 2018-10-11 ENCOUNTER — Ambulatory Visit
Admission: RE | Admit: 2018-10-11 | Discharge: 2018-10-11 | Disposition: A | Discharge status: Home / Self Care | Payer: PRIVATE HEALTH INSURANCE | Source: Ambulatory Visit | Attending: Neurology | Admitting: Neurology

## 2018-10-11 DIAGNOSIS — R9089 Other abnormal findings on diagnostic imaging of central nervous system: Secondary | ICD-10-CM

## 2018-10-11 DIAGNOSIS — R200 Anesthesia of skin: Secondary | ICD-10-CM | POA: Diagnosis not present

## 2018-10-11 NOTE — Telephone Encounter (Signed)
-----   Message from Britt Bottom, MD sent at 10/11/2018 10:14 AM EDT ----- Please let the patient know that the MRi looks good.   No evidence of MS in the spinal cord.   She does havea very tiny disc bulge at [facility] that causing any nerve root issues

## 2018-10-12 ENCOUNTER — Telehealth: Payer: Self-pay | Admitting: *Deleted

## 2018-10-12 NOTE — Telephone Encounter (Signed)
-----   Message from Britt Bottom, MD sent at 10/11/2018  6:27 PM EDT ----- Please let her know that the MRI of the thoracic spine was normal.

## 2018-10-19 ENCOUNTER — Other Ambulatory Visit: Payer: Self-pay

## 2018-10-19 ENCOUNTER — Ambulatory Visit (HOSPITAL_COMMUNITY)
Admission: RE | Admit: 2018-10-19 | Discharge: 2018-10-19 | Disposition: A | Discharge status: Home / Self Care | Payer: PRIVATE HEALTH INSURANCE | Source: Ambulatory Visit | Attending: Neurology | Admitting: Neurology

## 2018-10-19 DIAGNOSIS — R200 Anesthesia of skin: Secondary | ICD-10-CM

## 2018-10-19 DIAGNOSIS — R9089 Other abnormal findings on diagnostic imaging of central nervous system: Secondary | ICD-10-CM

## 2018-10-20 ENCOUNTER — Telehealth: Payer: Self-pay | Admitting: *Deleted

## 2018-10-20 ENCOUNTER — Ambulatory Visit: Payer: PRIVATE HEALTH INSURANCE | Admitting: Neurology

## 2018-10-20 NOTE — Telephone Encounter (Signed)
-----   Message from Britt Bottom, MD sent at 10/19/2018  6:12 PM EDT ----- Please let her know that the transcranial bubble study was normal --- so the bubbles that crossed over late on the ECHO are not clinically significant

## 2018-10-27 ENCOUNTER — Other Ambulatory Visit: Payer: PRIVATE HEALTH INSURANCE

## 2018-10-27 ENCOUNTER — Ambulatory Visit
Admission: RE | Admit: 2018-10-27 | Discharge: 2018-10-27 | Disposition: A | Discharge status: Home / Self Care | Payer: PRIVATE HEALTH INSURANCE | Source: Ambulatory Visit | Attending: Neurology | Admitting: Neurology

## 2018-10-27 ENCOUNTER — Other Ambulatory Visit: Payer: Self-pay

## 2018-10-27 DIAGNOSIS — R200 Anesthesia of skin: Secondary | ICD-10-CM

## 2018-10-27 DIAGNOSIS — R9089 Other abnormal findings on diagnostic imaging of central nervous system: Secondary | ICD-10-CM | POA: Diagnosis not present

## 2018-10-27 MED ORDER — GADOBUTROL 1 MMOL/ML IV SOLN
6.0000 mL | Freq: Once | INTRAVENOUS | Status: AC | PRN
  Administered 2018-10-27: 07:00:00 6 mL via INTRAVENOUS

## 2018-11-02 NOTE — Telephone Encounter (Signed)
Placed MRI up front for pt pick up.

## 2018-12-10 ENCOUNTER — Other Ambulatory Visit: Payer: Self-pay

## 2018-12-10 DIAGNOSIS — R110 Nausea: Secondary | ICD-10-CM

## 2018-12-10 DIAGNOSIS — F411 Generalized anxiety disorder: Secondary | ICD-10-CM

## 2018-12-11 NOTE — Telephone Encounter (Signed)
Last filled on 09/16/2018 #30 with 0 refill. LOV 09/16/2018 No future appointments

## 2018-12-13 MED ORDER — ONDANSETRON 4 MG PO TBDP: 4.0000 mg | ORAL_TABLET | Freq: Three times a day (TID) | ORAL | 0 refills | Status: DC | PRN

## 2019-03-16 ENCOUNTER — Encounter (HOSPITAL_COMMUNITY): Payer: Self-pay | Admitting: Emergency Medicine

## 2019-03-16 ENCOUNTER — Other Ambulatory Visit: Payer: Self-pay

## 2019-03-16 ENCOUNTER — Ambulatory Visit (HOSPITAL_COMMUNITY)
Admission: EM | Admit: 2019-03-16 | Discharge: 2019-03-16 | Disposition: A | Discharge status: Home / Self Care | Payer: PRIVATE HEALTH INSURANCE | Attending: Family Medicine | Admitting: Family Medicine

## 2019-03-16 DIAGNOSIS — R05 Cough: Secondary | ICD-10-CM

## 2019-03-16 DIAGNOSIS — Z79899 Other long term (current) drug therapy: Secondary | ICD-10-CM | POA: Diagnosis not present

## 2019-03-16 DIAGNOSIS — R059 Cough, unspecified: Secondary | ICD-10-CM

## 2019-03-16 DIAGNOSIS — J069 Acute upper respiratory infection, unspecified: Secondary | ICD-10-CM

## 2019-03-16 DIAGNOSIS — J45909 Unspecified asthma, uncomplicated: Secondary | ICD-10-CM | POA: Insufficient documentation

## 2019-03-16 DIAGNOSIS — Z20828 Contact with and (suspected) exposure to other viral communicable diseases: Secondary | ICD-10-CM | POA: Diagnosis not present

## 2019-03-16 DIAGNOSIS — J029 Acute pharyngitis, unspecified: Secondary | ICD-10-CM | POA: Insufficient documentation

## 2019-03-16 DIAGNOSIS — Z880 Allergy status to penicillin: Secondary | ICD-10-CM | POA: Insufficient documentation

## 2019-03-16 DIAGNOSIS — R0982 Postnasal drip: Secondary | ICD-10-CM | POA: Diagnosis present

## 2019-03-16 NOTE — ED Triage Notes (Signed)
Pt c/o cold sx onset 1 week associated w/PND, prod cough  Had tele visit and was given Mucinex and Amox 500 mg TID x7 days  A&O x4... No acute distress... ambulatory

## 2019-03-16 NOTE — ED Provider Notes (Signed)
[facility]    CSN: [id number] Arrival date & time: 03/16/19  1344      History   Chief Complaint Chief Complaint  Patient presents with  . URI    HPI Penny Hansen is a 33 y.o. female.   Penny Hansen presents with complaints of URI symtpoms. 8 days ago woke with post nasal drip. Sore throat when she would wake related to drainage. Improved during the day. Yesterday did a telephone visit last evening, as symptoms had persisted. Was prescribed amoxicillin and mucinex for concern for sinusitis. Started coughing last night, productive cough of yellow mucus. Temp this morning of 100.3, unsure of accuracy however. Works in Teacher, music as an Publishing rights manager, unsure of any specific covid-19 exposures. Some sore to upper back. Woke a lot with her cough. No shortness of breath . Some frontal headache. No other body aches. Nausea, no vomiting or diarrhea. No known ill contacts. History  Of asthma.     ROS per HPI, negative if not otherwise mentioned.      Past Medical History:  Diagnosis Date  . Asthma   . GERD (gastroesophageal reflux disease)    silent GERD which has caused cough in the past    Patient Active Problem List   Diagnosis Date Noted  . Numbness 09/20/2018  . Abnormal finding on MRI of brain 09/16/2018  . GAD (generalized anxiety disorder) 05/13/2017  . Other fatigue 09/09/2015  . GERD (gastroesophageal reflux disease) 09/09/2015  . Asthma     Past Surgical History:  Procedure Laterality Date  . TONSILECTOMY/ADENOIDECTOMY WITH MYRINGOTOMY  2012  . WISDOM TOOTH EXTRACTION  2010    OB History   No obstetric history on file.      Home Medications    Prior to Admission medications   Medication Sig Start Date End Date Taking? Authorizing Provider  albuterol (PROVENTIL HFA;VENTOLIN HFA) 108 (90 Base) MCG/ACT inhaler Inhale 2 puffs into the lungs every 4 (four) hours as needed for wheezing or shortness of breath. 02/16/18   Doreene Nest, NP  Ascorbic Acid (VITAMIN C) 100 MG tablet Take 100 mg by mouth as directed.    [provider]  Multiple Vitamins-Minerals (MULTIVITAMIN GUMMIES WOMENS) CHEW Chew by mouth as directed.    [provider]  Norgestimate-Ethinyl Estradiol Triphasic (TRI-VYLIBRA) 0.18/0.215/0.25 MG-35 MCG tablet Tri-VyLibra (28) 0.18 mg(7)/0.215 mg(7)/0.25 mg(7)-35 mcg tablet  TAKE 1 TABLET BY MOUTH EVERY DAY    [provider]  ondansetron (ZOFRAN ODT) 4 MG disintegrating tablet Take 1 tablet (4 mg total) by mouth every 8 (eight) hours as needed for nausea or vomiting. 12/13/18   Emi Belfast, FNP    Family History Family History  Problem Relation Age of Onset  . Ulcerative colitis Mother        Living  . Breast cancer Paternal Grandmother 81       breast cancer with mastectomy, chemo and radiation  . Diabetes Paternal Grandmother   . Cancer Paternal Grandmother        Breast  . Hyperlipidemia Father        Living  . Breast cancer Maternal Grandmother 31       breast cancer: lumpectomy  . Cancer Maternal Grandmother        Breast  . Heart disease Maternal Grandfather        MI; dissecting aneurysm  . Heart attack Maternal Grandfather   . Hypertension Maternal Grandfather   . Prostate cancer Paternal Grandfather   .  Healthy Sister        x2    Social History Social History   Tobacco Use  . Smoking status: Never Smoker  . Smokeless tobacco: Never Used  Substance Use Topics  . Alcohol use: Yes    Alcohol/week: 0.0 standard drinks    Comment: rare  . Drug use: No     Allergies   Amoxicillin-pot clavulanate   Review of Systems Review of Systems   Physical Exam Triage Vital Signs ED Triage Vitals  Enc Vitals Group     BP 03/16/19 1457 116/72     Pulse Rate 03/16/19 1457 92     Resp 03/16/19 1457 20     Temp 03/16/19 1457 99.3 F (37.4 C)     Temp Source 03/16/19 1457 Oral     SpO2 03/16/19 1457 99 %     Weight --      Height --       Head Circumference --      Peak Flow --      Pain Score 03/16/19 1459 0     Pain Loc --      Pain Edu? --      Excl. in GC? --    No data found.  Updated Vital Signs BP 116/72 (BP Location: Left Arm)   Pulse 92   Temp 99.3 F (37.4 C) (Oral)   Resp 20   LMP 03/08/2019   SpO2 99%   Physical Exam Constitutional:      General: She is not in acute distress.    Appearance: She is well-developed.  HENT:     Nose: Rhinorrhea present.     Mouth/Throat:     Mouth: Mucous membranes are moist.     Pharynx: No oropharyngeal exudate.  Cardiovascular:     Rate and Rhythm: Normal rate.  Pulmonary:     Effort: Pulmonary effort is normal.     Comments: Congestion noted with speech without cough throughout exam  Skin:    General: Skin is warm and dry.  Neurological:     Mental Status: She is alert and oriented to person, place, and time.      UC Treatments / Results  Labs (all labs ordered are listed, but only abnormal results are displayed) Labs Reviewed  NOVEL CORONAVIRUS, NAA (HOSP ORDER, SEND-OUT TO REF LAB; TAT 18-24 HRS)    EKG   Radiology No results found.  Procedures Procedures (including critical care time)  Medications Ordered in UC Medications - No data to display  Initial Impression / Assessment and Plan / UC Course  I have reviewed the triage vital signs and the nursing notes.  Pertinent labs & imaging results that were available during my care of the patient were reviewed by me and considered in my medical decision making (see chart for details).     Non toxic. Benign physical exam. No increased work of breathing. No fever here today. covid testing collected and pending as does work in Teacher, music. Already on antibiotics for any bacterial sinusitis. Return precautions provided. Patient verbalized understanding and agreeable to plan.   Final Clinical Impressions(s) / UC Diagnoses   Final diagnoses:  Acute upper respiratory infection  Cough      Discharge Instructions     Self isolate until covid results are back and negative.  Will notify you by phone of any positive findings. Your negative results will be sent through your MyChart.     Continue with medications as prescribed by visit yesterday. Probiotic  may be helpful while you are antibiotics as well.  Push fluids to ensure adequate hydration and keep secretions thin.  Tylenol and/or ibuprofen as needed for pain or fevers.      ED Prescriptions    None     PDMP not reviewed this encounter.   Zigmund Gottron, NP 03/16/19 1521

## 2019-03-16 NOTE — Discharge Instructions (Addendum)
Self isolate until covid results are back and negative.  Will notify you by phone of any positive findings. Your negative results will be sent through your MyChart.     Continue with medications as prescribed by visit yesterday. Probiotic may be helpful while you are antibiotics as well.  Push fluids to ensure adequate hydration and keep secretions thin.  Tylenol and/or ibuprofen as needed for pain or fevers.

## 2019-03-18 LAB — NOVEL CORONAVIRUS, NAA (HOSP ORDER, SEND-OUT TO REF LAB; TAT 18-24 HRS): SARS-CoV-2, NAA: NOT DETECTED

## 2019-04-11 ENCOUNTER — Ambulatory Visit (INDEPENDENT_AMBULATORY_CARE_PROVIDER_SITE_OTHER): Payer: Managed Care, Other (non HMO) | Admitting: Neurology

## 2019-04-11 ENCOUNTER — Other Ambulatory Visit: Payer: Self-pay

## 2019-04-11 ENCOUNTER — Encounter: Payer: Self-pay | Admitting: Neurology

## 2019-04-11 VITALS — BP 123/76 | HR 82 | Temp 97.9°F | Ht 67.0 in | Wt 139.0 lb

## 2019-04-11 DIAGNOSIS — R930 Abnormal findings on diagnostic imaging of skull and head, not elsewhere classified: Secondary | ICD-10-CM | POA: Diagnosis not present

## 2019-04-11 DIAGNOSIS — R200 Anesthesia of skin: Secondary | ICD-10-CM | POA: Diagnosis not present

## 2019-04-11 DIAGNOSIS — R5383 Other fatigue: Secondary | ICD-10-CM

## 2019-04-11 DIAGNOSIS — R9089 Other abnormal findings on diagnostic imaging of central nervous system: Secondary | ICD-10-CM | POA: Diagnosis not present

## 2019-04-11 NOTE — Progress Notes (Signed)
[facility]  PATIENT: Penny Hansen DOB: February 19, 1986  REFERRING DOCTOR OR PCP: Vernona Rieger, NP SOURCE: Patient, notes from PCP, MRI images personally reviewed.  _________________________________   HISTORICAL  CHIEF COMPLAINT:  Chief Complaint  Patient presents with  . Follow-up    RM 12, alone.    HISTORY OF PRESENT ILLNESS:   Penny Hansen is a 34 y.o. woman with an abnormal brain MRI.  Update  04/10/2018: She has no major new symptoms.   She does note intermittent pain in her right hand in the palm between the 4th and 5th metacarpal.  She has had some intermittent numbness.  She also had one day with a lot of dizziness.   She notes that day was busy and   The same day she kept making an error writing down.  Sometimes she has fatigue.  Since her last visit she has had: MRI of the spine and brain was performed 10/07/19 through 10/27/2019.    MRI of the cervical and thoracic spine showed no spinal cord lesions and brain showed 10 foci, 5 periventricular and 5 subcortical.  Echocardiogram with bubbles showed some bubbles crossing late (not c/w ASD/PFO but could be pulmonary AVMs.   Follow up transcranial dopplers with agitated saline showed no bubbles.     ANA/ANCA  09/20/18 were normal or negative    Initial Consult 09/20/2018:  She is a 34 year old woman who was found to have an abnormal brain MRI 09/13/2018.  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   The safety read was abnormal showing that she had multiple subcortical and periventricular foci.   The study was done without contrast.  Her father is an orthopedic surgeon and showed the MRI to Dr. Olin Pia (Neurology) in [facility] who recommended that [facility] see me for further evaluation.  Back in 2014, she had some tingling in the right flank that lasted about 3 days.   She was working long shifts so assumed it was a muscle strain.   She had no  other symptoms at that time.  Since she improved after a few days she did not seek medical attention.  She has not had any recurrence.  She does not note any numbness or dysesthesia this time.  In 2017, she was seen by primary care for fatigue.  She has no problems with gait, balance or strength.     She has no difficulty with bladder function.   Besides correction requiring contact lenses, she notes no visual issues.    She denies diplopia or vertigo.  She has not had any significant issues with fatigue and denies cognitive dysfunction or mood disturbance.  She does not have migraine headaches or any known cardiac disease..   She has no FH of MS.      I personally reviewed the MRI of the brain from 09/13/2018 showed 3 periventricular (right posterior horn, left posterior horn and right temporal horn and 4 subcortical (largest right mesial temporal, one right frontal and 2 left temporoparietal).  Maybe another punctate left mesial frontal.  Also there is a left cerebellar focus vs.artifact.     REVIEW OF SYSTEMS: Constitutional: No fevers, chills, sweats, or change in appetite Eyes: No visual changes, double vision, eye pain Ear, nose and throat: No hearing loss, ear pain, nasal congestion, sore throat Cardiovascular: No chest pain, palpitations Respiratory: No shortness of breath at rest or with exertion.   No wheezes GastrointestinaI:  No nausea, vomiting, diarrhea, abdominal pain, fecal incontinence Genitourinary: No dysuria, urinary retention or frequency.  No nocturia. Musculoskeletal: No neck pain, back pain Integumentary: No rash, pruritus, skin lesions Neurological: as above Psychiatric: No depression at this time.  No anxiety Endocrine: No palpitations, diaphoresis, change in appetite, change in weigh or increased thirst Hematologic/Lymphatic: No anemia, purpura, petechiae. Allergic/Immunologic: No itchy/runny eyes, nasal congestion, recent allergic reactions,  rashes  ALLERGIES: Allergies  Allergen Reactions  . Amoxicillin-Pot Clavulanate Other (See Comments)    Gi upset    HOME MEDICATIONS:  Current Outpatient Medications:  .  albuterol (PROVENTIL HFA;VENTOLIN HFA) 108 (90 Base) MCG/ACT inhaler, Inhale 2 puffs into the lungs every 4 (four) hours as needed for wheezing or shortness of breath., Disp: 1 Inhaler, Rfl: 0 .  Ascorbic Acid (VITAMIN C) 100 MG tablet, Take 100 mg by mouth as directed., Disp: , Rfl:  .  Multiple Vitamins-Minerals (MULTIVITAMIN GUMMIES WOMENS) CHEW, Chew by mouth as directed., Disp: , Rfl:  .  ondansetron (ZOFRAN ODT) 4 MG disintegrating tablet, Take 1 tablet (4 mg total) by mouth every 8 (eight) hours as needed for nausea or vomiting., Disp: 30 tablet, Rfl: 0  PAST MEDICAL HISTORY: Past Medical History:  Diagnosis Date  . Asthma   . GERD (gastroesophageal reflux disease)    silent GERD which has caused cough in the past    PAST SURGICAL HISTORY: Past Surgical History:  Procedure Laterality Date  . TONSILECTOMY/ADENOIDECTOMY WITH MYRINGOTOMY  2012  . WISDOM TOOTH EXTRACTION  2010    FAMILY HISTORY: Family History  Problem Relation Age of Onset  . Ulcerative colitis Mother        Living  . Breast cancer Paternal Grandmother 65       breast cancer with mastectomy, chemo and radiation  . Diabetes Paternal Grandmother   . Cancer Paternal Grandmother        Breast  . Hyperlipidemia Father        Living  . Breast cancer Maternal Grandmother 84       breast cancer: lumpectomy  . Cancer Maternal Grandmother        Breast  . Heart disease Maternal Grandfather        MI; dissecting aneurysm  . Heart attack Maternal Grandfather   . Hypertension Maternal Grandfather   . Prostate cancer Paternal Grandfather   . Healthy Sister        x2    SOCIAL HISTORY:  Social History   Socioeconomic History  . Marital status: Married    Spouse name: Not on file  . Number of children: 0  . Years of education:  Bachelors  . Highest education level: Not on file  Occupational History  . Occupation: [employer]  Tobacco Use  . Smoking status: Never Smoker  . Smokeless tobacco: Never Used  Substance and Sexual Activity  . Alcohol use: Yes    Alcohol/week: 0.0 standard drinks    Comment: rare  . Drug use: No  . Sexual activity: Yes    Birth control/protection: Pill  Other Topics Concern  . Not on file  Social History Narrative   Works as Research officer, political party at [employer]   Married   No children   [place] to [place] in 2007 from [place]   Enjoys relaxing.    Right handed    Caffeine use: Coffee every day   Tea very rare   Social Determinants of Health   Financial Resource Strain:   . Difficulty of Paying Living  Expenses: Not on file  Food Insecurity:   . Worried About Programme researcher, broadcasting/film/video in the Last Year: Not on file  . Ran Out of Food in the Last Year: Not on file  Transportation Needs:   . Lack of Transportation (Medical): Not on file  . Lack of Transportation (Non-Medical): Not on file  Physical Activity:   . Days of Exercise per Week: Not on file  . Minutes of Exercise per Session: Not on file  Stress:   . Feeling of Stress : Not on file  Social Connections:   . Frequency of Communication with Friends and Family: Not on file  . Frequency of Social Gatherings with Friends and Family: Not on file  . Attends Religious Services: Not on file  . Active Member of Clubs or Organizations: Not on file  . Attends Banker Meetings: Not on file  . Marital Status: Not on file  Intimate Partner Violence:   . Fear of Current or Ex-Partner: Not on file  . Emotionally Abused: Not on file  . Physically Abused: Not on file  . Sexually Abused: Not on file     PHYSICAL EXAM  Vitals:   04/11/19 1005  BP: 123/76  Pulse: 82  Temp: 97.9 F (36.6 C)  Weight: 139 lb (63 kg)  Height: 5\' 7"  (1.702 m)    Body mass index is 21.77 kg/m.   General: The patient is  well-developed and well-nourished and in no acute distress  Neurologic Exam  Mental status: The patient is alert and oriented x 3 at the time of the examination. The patient has apparent normal recent and remote memory, with an apparently normal attention span and concentration ability.   Speech is normal.  Cranial nerves: Extraocular movements are full.There is good facial sensation to soft touch bilaterally.Facial strength is normal.  Trapezius and sternocleidomastoid strength is normal. No dysarthria is noted. No obvious hearing deficits are noted.  Motor:  Muscle bulk is normal.   Tone is normal. Strength is  5 / 5 in all 4 extremities.   Sensory: Sensory testing is intact to pinprick, soft touch and vibration sensation in all 4 extremities.  Coordination: Cerebellar testing reveals good finger-nose-finger and heel-to-shin bilaterally.  Gait and station: Station is normal.   Gait and tandem gait are normal.  Romberg is negative. Reflexes: Deep tendon reflexes are symmetric and normal bilaterally.   Plantar responses are flexor.      ASSESSMENT AND PLAN  1. Abnormal finding on MRI of brain   2. Numbness   3. Other fatigue   4. Radiologically isolated syndrome      1.   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  Although she has had some intermittent neurologic symptoms, none of them have had a time course consistent with MS.  We need to check another MRI sometime the next month to determine if there has been any progression.  If this is occurring, the likelihood that she has MS is higher. 2.   Stay active. 3.   Return to see me in 6 months or sooner for new or worsening neurologic symptoms.  Penny Hansen A. , MD, [facility] 04/11/2019, 6:08 PM Certified in Neurology, Clinical Neurophysiology, Sleep Medicine and Neuroimaging  [facility] [address], [address] [place], [place] [phone]

## 2019-04-20 ENCOUNTER — Ambulatory Visit (HOSPITAL_COMMUNITY)
Admission: EM | Admit: 2019-04-20 | Discharge: 2019-04-20 | Disposition: A | Discharge status: Home / Self Care | Payer: Managed Care, Other (non HMO) | Attending: Family Medicine | Admitting: Family Medicine

## 2019-04-20 ENCOUNTER — Encounter (HOSPITAL_COMMUNITY): Payer: Self-pay

## 2019-04-20 ENCOUNTER — Other Ambulatory Visit: Payer: Self-pay

## 2019-04-20 DIAGNOSIS — Z20822 Contact with and (suspected) exposure to covid-19: Secondary | ICD-10-CM | POA: Insufficient documentation

## 2019-04-20 DIAGNOSIS — K219 Gastro-esophageal reflux disease without esophagitis: Secondary | ICD-10-CM | POA: Insufficient documentation

## 2019-04-20 DIAGNOSIS — Z79899 Other long term (current) drug therapy: Secondary | ICD-10-CM | POA: Insufficient documentation

## 2019-04-20 DIAGNOSIS — J3489 Other specified disorders of nose and nasal sinuses: Secondary | ICD-10-CM | POA: Insufficient documentation

## 2019-04-20 DIAGNOSIS — J45909 Unspecified asthma, uncomplicated: Secondary | ICD-10-CM | POA: Diagnosis not present

## 2019-04-20 DIAGNOSIS — R0981 Nasal congestion: Secondary | ICD-10-CM | POA: Insufficient documentation

## 2019-04-20 NOTE — Discharge Instructions (Addendum)
We have tested you for COVID  Go home and quarantine until we get our results.  You can take OTC medications as needed.   

## 2019-04-20 NOTE — ED Triage Notes (Signed)
Pt presents to UC for COVID testing after exposure. Pt states her boyfriend had a positive rapid COVID test this morning. Pt reports she is having nasal congestion x 1 week.

## 2019-04-21 NOTE — ED Provider Notes (Signed)
[place]    CSN: [id number] Arrival date & time: 04/20/19  1313      History   Chief Complaint Chief Complaint  Patient presents with  . COVID exposure    HPI Penny Hansen is a 34 y.o. female.   Patient is a 34 year old female presents today for Covid testing due to exposure.  Reporting her boyfriend has been having similar symptoms to her and tested positive for Covid this morning.  She has been having nasal congestion, rhinorrhea x1 week.  Symptoms have been constant.  She has not taken anything for her symptoms.  Denies any fever, chills, body aches, cough, chest congestion, shortness of breath.  ROS per HPI      Past Medical History:  Diagnosis Date  . Asthma   . GERD (gastroesophageal reflux disease)    silent GERD which has caused cough in the past    Patient Active Problem List   Diagnosis Date Noted  . 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 Numbness 09/20/2018  . Abnormal finding on MRI of brain 09/16/2018  . GAD (generalized anxiety disorder) 05/13/2017  . Other fatigue 09/09/2015  . GERD (gastroesophageal reflux disease) 09/09/2015  . Asthma     Past Surgical History:  Procedure Laterality Date  . TONSILECTOMY/ADENOIDECTOMY WITH MYRINGOTOMY  2012  . WISDOM TOOTH EXTRACTION  2010    OB History   No obstetric history on file.      Home Medications    Prior to Admission medications   Medication Sig Start Date End Date Taking? Authorizing Provider  guaiFENesin (MUCINEX) 600 MG 12 hr tablet Take by mouth 2 (two) times daily.   Yes [provider]  albuterol (PROVENTIL HFA;VENTOLIN HFA) 108 (90 Base) MCG/ACT inhaler Inhale 2 puffs into the lungs every 4 (four) hours as needed for wheezing or shortness of breath. 02/16/18   Pleas Koch, NP  Ascorbic Acid (VITAMIN C) 100 MG tablet Take 100 mg by mouth as directed.    [provider]  Multiple Vitamins-Minerals (MULTIVITAMIN GUMMIES WOMENS) CHEW  Chew by mouth as directed.    [provider]  ondansetron (ZOFRAN ODT) 4 MG disintegrating tablet Take 1 tablet (4 mg total) by mouth every 8 (eight) hours as needed for nausea or vomiting. 12/13/18   Elby Beck, FNP    Family History Family History  Problem Relation Age of Onset  . Ulcerative colitis Mother        Living  . Breast cancer Paternal Grandmother 82       breast cancer with mastectomy, chemo and radiation  . Diabetes Paternal Grandmother   . Cancer Paternal Grandmother        Breast  . Hyperlipidemia Father        Living  . Breast cancer Maternal Grandmother 6       breast cancer: lumpectomy  . Cancer Maternal Grandmother        Breast  . Heart disease Maternal Grandfather        MI; dissecting aneurysm  . Heart attack Maternal Grandfather   . Hypertension Maternal Grandfather   . Prostate cancer Paternal Grandfather   . Healthy Sister        x2    Social History Social History   Tobacco Use  . Smoking status: Never Smoker  . Smokeless tobacco: Never Used  Substance Use Topics  . Alcohol use: Yes    Alcohol/week: 0.0 standard drinks    Comment: rare  . Drug use:  No     Allergies   Amoxicillin-pot clavulanate   Review of Systems Review of Systems   Physical Exam Triage Vital Signs ED Triage Vitals  Enc Vitals Group     BP 04/20/19 1448 129/79     Pulse Rate 04/20/19 1448 83     Resp 04/20/19 1448 15     Temp 04/20/19 1448 98.7 F (37.1 C)     Temp Source 04/20/19 1448 Oral     SpO2 04/20/19 1448 98 %     Weight --      Height --      Head Circumference --      Peak Flow --      Pain Score 04/20/19 1447 0     Pain Loc --      Pain Edu? --      Excl. in GC? --    No data found.  Updated Vital Signs BP 129/79 (BP Location: Right Arm)   Pulse 83   Temp 98.7 F (37.1 C) (Oral)   Resp 15   LMP 04/07/2019   SpO2 98%   Visual Acuity Right Eye Distance:   Left Eye Distance:   Bilateral Distance:    Right Eye  Near:   Left Eye Near:    Bilateral Near:     Physical Exam Vitals and nursing note reviewed.  Constitutional:      General: She is not in acute distress.    Appearance: Normal appearance. She is not ill-appearing, toxic-appearing or diaphoretic.  HENT:     Head: Normocephalic.     Nose: Nose normal.     Mouth/Throat:     Pharynx: Oropharynx is clear.  Eyes:     Conjunctiva/sclera: Conjunctivae normal.  Pulmonary:     Effort: Pulmonary effort is normal.  Musculoskeletal:        General: Normal range of motion.     Cervical back: Normal range of motion.  Skin:    General: Skin is warm and dry.     Findings: No rash.  Neurological:     Mental Status: She is alert.  Psychiatric:        Mood and Affect: Mood normal.      UC Treatments / Results  Labs (all labs ordered are listed, but only abnormal results are displayed) Labs Reviewed  NOVEL CORONAVIRUS, NAA (HOSP ORDER, SEND-OUT TO REF LAB; TAT 18-24 HRS)    EKG   Radiology No results found.  Procedures Procedures (including critical care time)  Medications Ordered in UC Medications - No data to display  Initial Impression / Assessment and Plan / UC Course  I have reviewed the triage vital signs and the nursing notes.  Pertinent labs & imaging results that were available during my care of the patient were reviewed by me and considered in my medical decision making (see chart for details).     Exposure to COVID-19-labs sent for testing Precautions given OTC meds as needed.  Final Clinical Impressions(s) / UC Diagnoses   Final diagnoses:  Exposure to COVID-19 virus     Discharge Instructions     We have tested you for COVID  Go home and quarantine until we get our results.  You can take OTC medications as needed.      ED Prescriptions    None     PDMP not reviewed this encounter.   Janace Aris, NP 04/21/19 [phone]

## 2019-04-22 LAB — NOVEL CORONAVIRUS, NAA (HOSP ORDER, SEND-OUT TO REF LAB; TAT 18-24 HRS): SARS-CoV-2, NAA: NOT DETECTED

## 2019-05-05 ENCOUNTER — Ambulatory Visit
Admission: RE | Admit: 2019-05-05 | Discharge: 2019-05-05 | Disposition: A | Discharge status: Home / Self Care | Payer: Managed Care, Other (non HMO) | Source: Ambulatory Visit | Attending: Neurology | Admitting: Neurology

## 2019-05-05 ENCOUNTER — Other Ambulatory Visit: Payer: Self-pay

## 2019-05-05 DIAGNOSIS — R200 Anesthesia of skin: Secondary | ICD-10-CM

## 2019-05-05 DIAGNOSIS — R9089 Other abnormal findings on diagnostic imaging of central nervous system: Secondary | ICD-10-CM

## 2019-05-05 MED ORDER — GADOBUTROL 1 MMOL/ML IV SOLN
6.0000 mL | Freq: Once | INTRAVENOUS | Status: AC | PRN
  Administered 2019-05-05: 18:00:00 6 mL via INTRAVENOUS

## 2019-08-11 ENCOUNTER — Other Ambulatory Visit: Payer: Self-pay

## 2019-08-11 DIAGNOSIS — Z1231 Encounter for screening mammogram for malignant neoplasm of breast: Secondary | ICD-10-CM

## 2019-08-12 ENCOUNTER — Ambulatory Visit
Admission: RE | Admit: 2019-08-12 | Discharge: 2019-08-12 | Disposition: A | Discharge status: Home / Self Care | Payer: Managed Care, Other (non HMO) | Source: Ambulatory Visit

## 2019-08-12 ENCOUNTER — Other Ambulatory Visit: Payer: Self-pay

## 2019-08-12 DIAGNOSIS — Z1231 Encounter for screening mammogram for malignant neoplasm of breast: Secondary | ICD-10-CM

## 2019-08-12 IMAGING — MG DIGITAL SCREENING BILAT W/ TOMO W/ CAD
8 series · 9 of 24 positions shown · non-contrast
Comparison: None.

CLINICAL DATA: Screening.

EXAM:
DIGITAL SCREENING BILATERAL MAMMOGRAM WITH TOMO AND CAD

[R CC synth-2D]
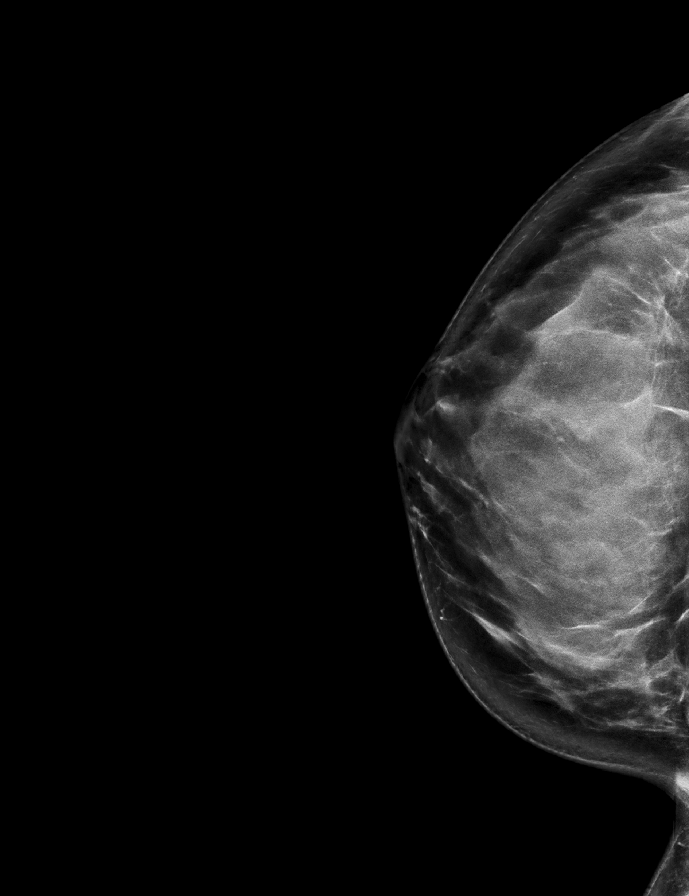

[R MLO synth-2D]
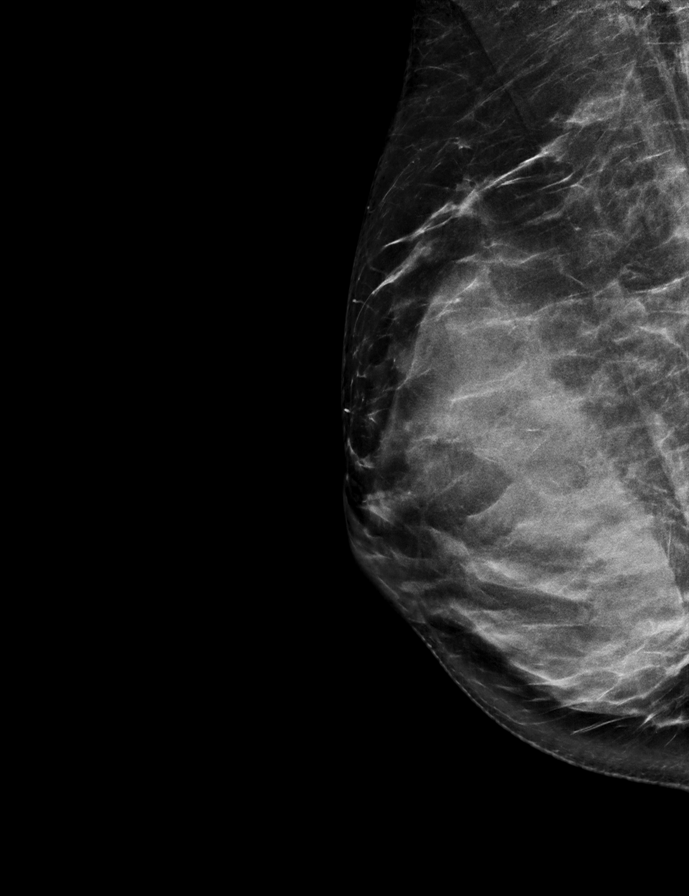

[L CC synth-2D]
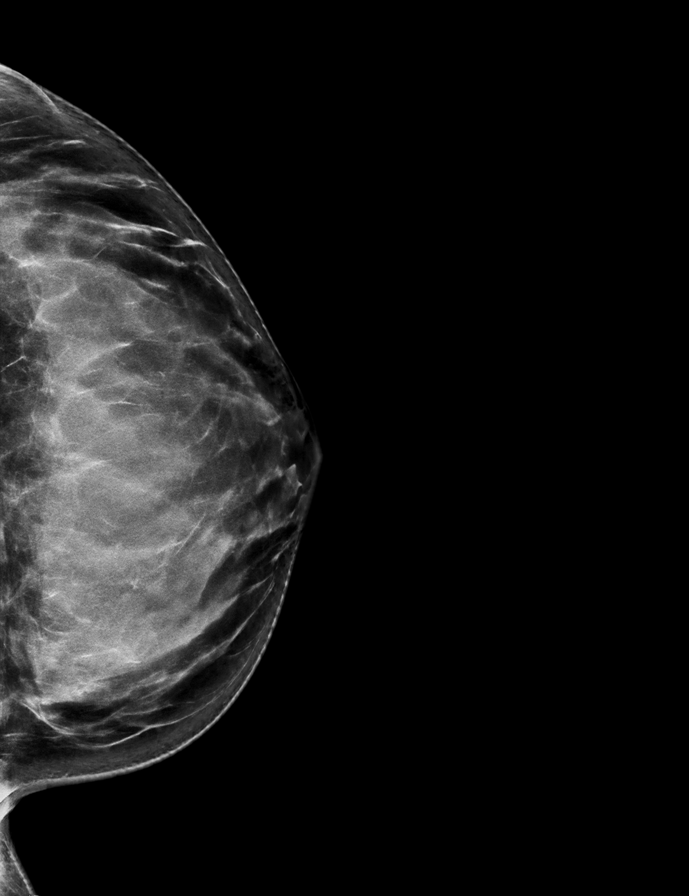

[L MLO synth-2D]
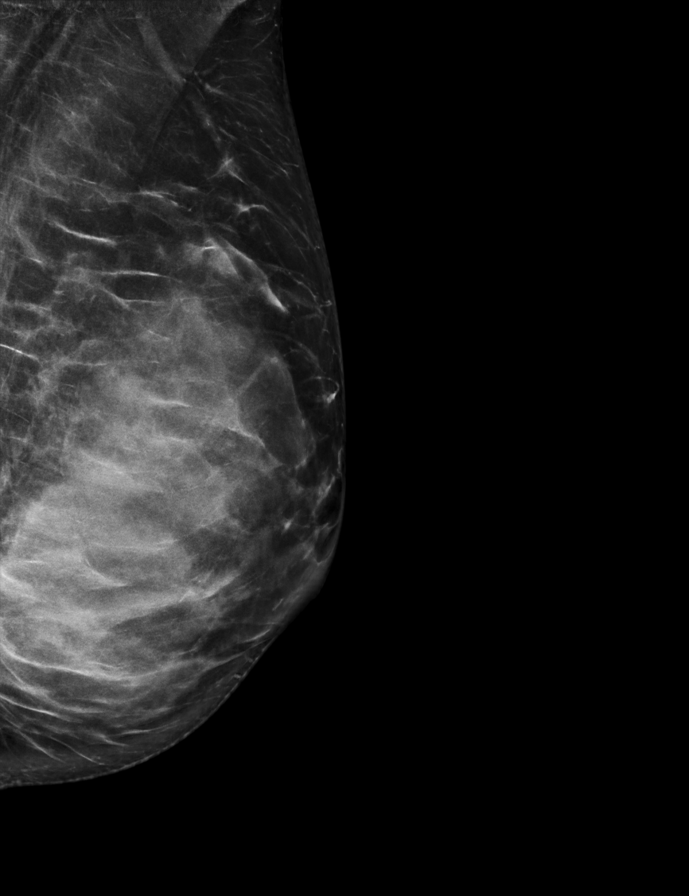

[R CC tomo · 2 of 75 frames shown]
[frame 25/75]
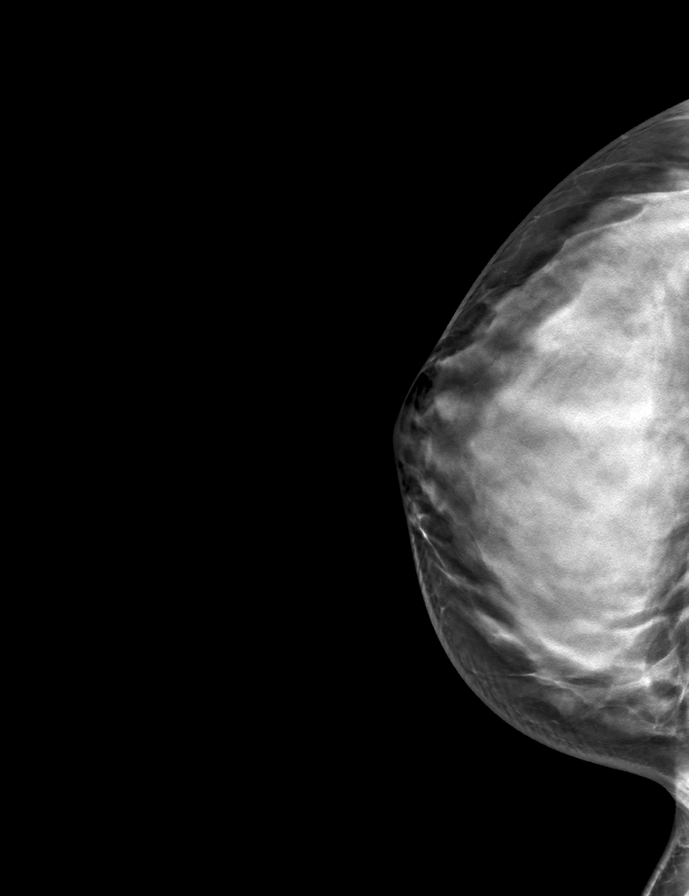
[frame 38/75]
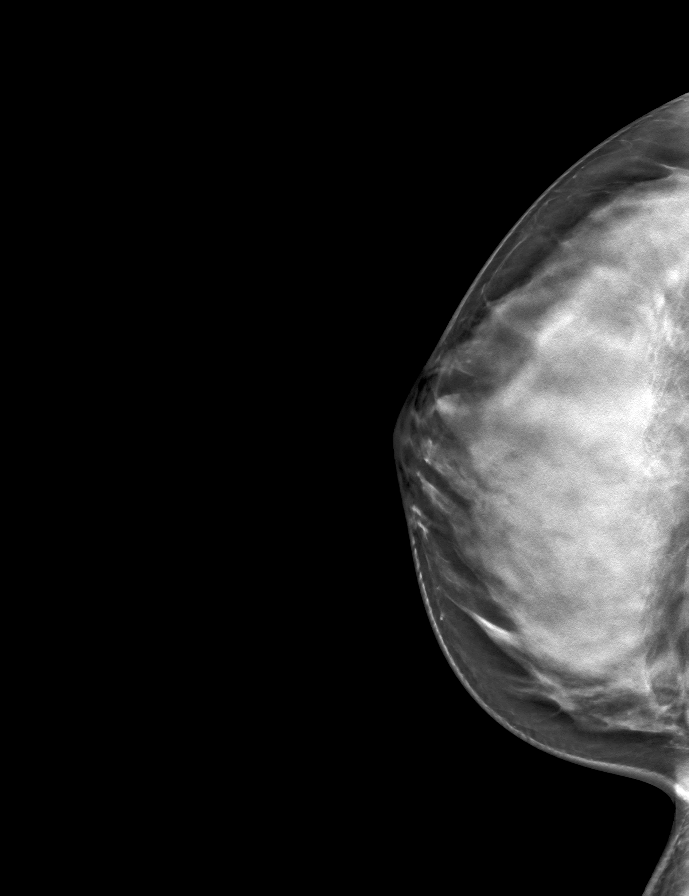

[R MLO tomo · tomo slice 36/71.0]
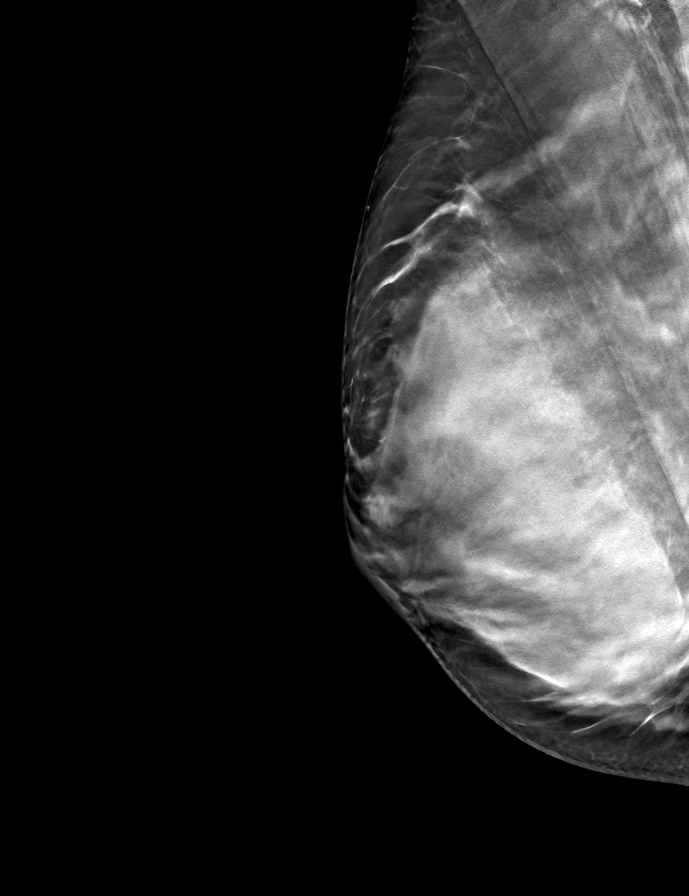

[L MLO tomo · tomo slice 36/71.0]
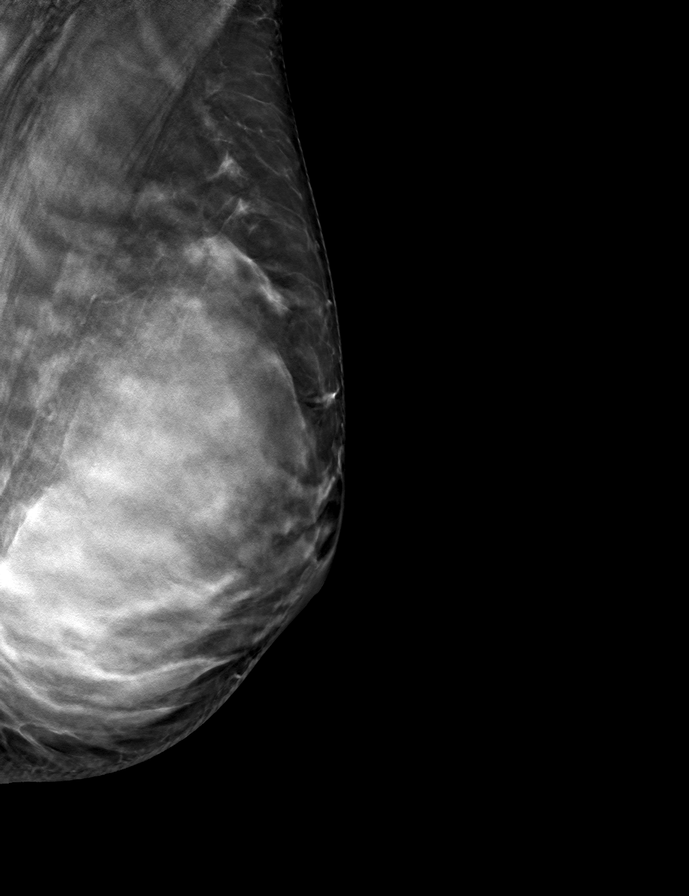

[L CC tomo · tomo slice 36/71.0]
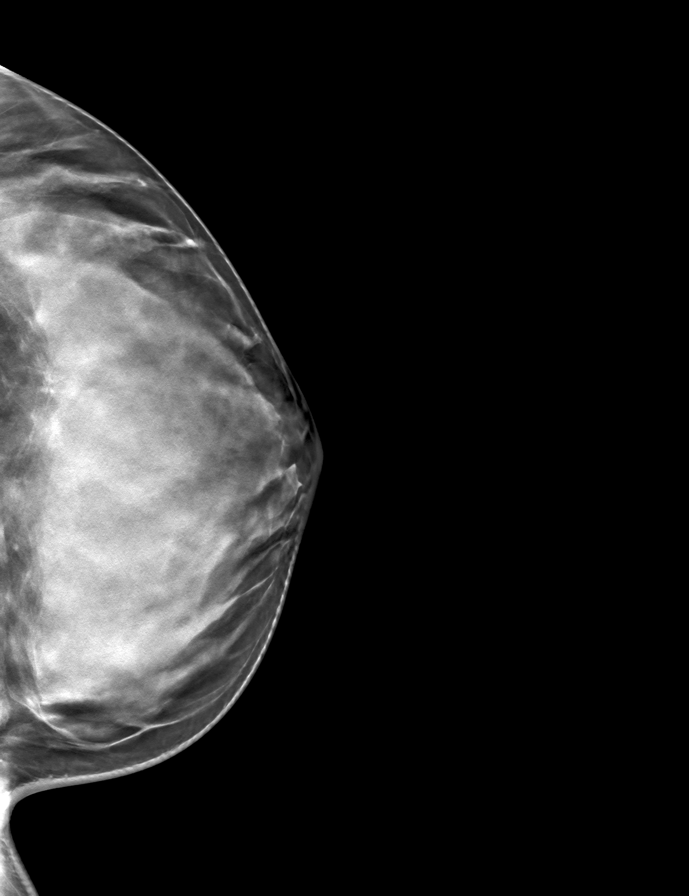

[9 of 24 positions shown; findings below may reference images not displayed]

ACR Breast Density Category d: The breast tissue is extremely dense,
which lowers the sensitivity of mammography.
FINDINGS: There are no findings suspicious for malignancy. Images were
processed with CAD.
IMPRESSION: No mammographic evidence of malignancy. A result letter of this
screening mammogram will be mailed directly to the patient.

RECOMMENDATION:
Screening mammogram at age 42. (Code:[PG])

BI-RADS CATEGORY  1: Negative.

## 2019-09-18 ENCOUNTER — Other Ambulatory Visit: Payer: Self-pay | Admitting: Family Medicine

## 2019-09-18 DIAGNOSIS — F411 Generalized anxiety disorder: Secondary | ICD-10-CM

## 2019-09-18 DIAGNOSIS — R110 Nausea: Secondary | ICD-10-CM

## 2019-09-20 NOTE — Telephone Encounter (Signed)
Have not seen patient in 1 year, needs office visit for any refills.

## 2019-09-20 NOTE — Telephone Encounter (Signed)
Last prescribed on 12/13/2018 Last OV (follow up) with Mayra Reel on 09/16/2018  No future OV scheduled

## 2019-09-26 DIAGNOSIS — R110 Nausea: Secondary | ICD-10-CM

## 2019-09-26 DIAGNOSIS — F411 Generalized anxiety disorder: Secondary | ICD-10-CM

## 2019-09-26 MED ORDER — ONDANSETRON 4 MG PO TBDP: 4.0000 mg | ORAL_TABLET | Freq: Three times a day (TID) | ORAL | 0 refills | Status: DC | PRN

## 2019-09-30 NOTE — Telephone Encounter (Signed)
LVM requesting pt to call office to schedule Ov for med refill.

## 2019-10-17 ENCOUNTER — Ambulatory Visit: Payer: Managed Care, Other (non HMO) | Admitting: Neurology

## 2019-10-17 ENCOUNTER — Telehealth: Payer: Self-pay | Admitting: Neurology

## 2019-10-17 ENCOUNTER — Encounter: Payer: Self-pay | Admitting: Neurology

## 2019-10-17 ENCOUNTER — Other Ambulatory Visit: Payer: Self-pay

## 2019-10-17 VITALS — BP 110/78 | HR 78 | Ht 67.0 in | Wt 137.0 lb

## 2019-10-17 DIAGNOSIS — R930 Abnormal findings on diagnostic imaging of skull and head, not elsewhere classified: Secondary | ICD-10-CM | POA: Diagnosis not present

## 2019-10-17 DIAGNOSIS — R9089 Other abnormal findings on diagnostic imaging of central nervous system: Secondary | ICD-10-CM

## 2019-10-17 DIAGNOSIS — E559 Vitamin D deficiency, unspecified: Secondary | ICD-10-CM | POA: Diagnosis not present

## 2019-10-17 NOTE — Progress Notes (Signed)
[facility]  PATIENT: Penny Hansen DOB: November 02, 1985  REFERRING DOCTOR OR PCP: Vernona Rieger, NP SOURCE: Patient, notes from PCP, MRI images personally reviewed.  _________________________________   HISTORICAL  CHIEF COMPLAINT:  Chief Complaint  Patient presents with  . Follow-up    RM 12, alone. Last seen 04/11/2019. Doing well, no new sx    HISTORY OF PRESENT ILLNESS:   Penny Hansen is a 34 y.o. woman with an abnormal brain MRI.  10/17/2019: She denies any new neurologic symptoms.  She has never had a clinical exacerbation.  She fits criteria for radiologic isolated syndrome and has been MRI consistent with MS though has never had MS related neurologic symptoms.  Since last visit, she had another MRI of the brain that showed 10-12 foci, about half that were periventricular and the other half subcortical or deep white matter.  Additionally, there is a subtle focus in the left cerebellar hemisphere.  None of the foci appear to be new and they were present on the previous MRIs.  Additionally she has had MRI of the cervical and thoracic spine performed in 2020 and that did not show any demyelinating plaque..  Echocardiogram with bubbles showed some bubbles crossing late (not c/w ASD/PFO but could be pulmonary AVMs.   Follow up transcranial dopplers with agitated saline showed no bubbles.     ANA/ANCA  09/20/18 were normal or negative    Initial Consult 09/20/2018:  She is a 34 year old woman who was found to have an abnormal brain MRI 09/13/2018.  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   The safety read was abnormal showing that she had multiple subcortical and periventricular foci.   The study was done without contrast.  Her father is an orthopedic surgeon and showed the MRI to Dr. Olin Pia (Neurology) in [facility] who recommended that [facility] see me for further evaluation.  Back in 2014, she  had some tingling in the right flank that lasted about 3 days.   She was working long shifts so assumed it was a muscle strain.   She had no other symptoms at that time.  Since she improved after a few days she did not seek medical attention.  She has not had any recurrence.  She does not note any numbness or dysesthesia this time.  In 2017, she was seen by primary care for fatigue.  She has no problems with gait, balance or strength.     She has no difficulty with bladder function.   Besides correction requiring contact lenses, she notes no visual issues.    She denies diplopia or vertigo.  She has not had any significant issues with fatigue and denies cognitive dysfunction or mood disturbance.  She does not have migraine headaches or any known cardiac disease..   She has no FH of MS.      I personally reviewed the MRI of the brain from 09/13/2018 showed 3 periventricular (right posterior horn, left posterior horn and right temporal horn and 4 subcortical (largest right mesial temporal, one right frontal and 2 left temporoparietal).  Maybe another punctate left mesial frontal.  Also there is a left cerebellar focus vs.artifact.     REVIEW OF SYSTEMS: Constitutional: No fevers, chills, sweats, or change in appetite Eyes: No visual changes, double vision, eye pain Ear, nose and throat: No hearing loss, ear pain, nasal congestion, sore throat Cardiovascular: No chest pain, palpitations Respiratory: No shortness of breath  at rest or with exertion.   No wheezes GastrointestinaI: No nausea, vomiting, diarrhea, abdominal pain, fecal incontinence Genitourinary: No dysuria, urinary retention or frequency.  No nocturia. Musculoskeletal: No neck pain, back pain Integumentary: No rash, pruritus, skin lesions Neurological: as above Psychiatric: No depression at this time.  No anxiety Endocrine: No palpitations, diaphoresis, change in appetite, change in weigh or increased thirst Hematologic/Lymphatic: No  anemia, purpura, petechiae. Allergic/Immunologic: No itchy/runny eyes, nasal congestion, recent allergic reactions, rashes  ALLERGIES: Allergies  Allergen Reactions  . Amoxicillin-Pot Clavulanate Other (See Comments)    Gi upset    HOME MEDICATIONS:  Current Outpatient Medications:  .  albuterol (PROVENTIL HFA;VENTOLIN HFA) 108 (90 Base) MCG/ACT inhaler, Inhale 2 puffs into the lungs every 4 (four) hours as needed for wheezing or shortness of breath., Disp: 1 Inhaler, Rfl: 0 .  Ascorbic Acid (VITAMIN C) 100 MG tablet, Take 100 mg by mouth as directed., Disp: , Rfl:  .  guaiFENesin (MUCINEX) 600 MG 12 hr tablet, Take by mouth 2 (two) times daily., Disp: , Rfl:  .  Multiple Vitamins-Minerals (MULTIVITAMIN GUMMIES WOMENS) CHEW, Chew by mouth as directed., Disp: , Rfl:  .  ondansetron (ZOFRAN ODT) 4 MG disintegrating tablet, Take 1 tablet (4 mg total) by mouth every 8 (eight) hours as needed for nausea or vomiting., Disp: 15 tablet, Rfl: 0  PAST MEDICAL HISTORY: Past Medical History:  Diagnosis Date  . Asthma   . GERD (gastroesophageal reflux disease)    silent GERD which has caused cough in the past    PAST SURGICAL HISTORY: Past Surgical History:  Procedure Laterality Date  . TONSILECTOMY/ADENOIDECTOMY WITH MYRINGOTOMY  2012  . WISDOM TOOTH EXTRACTION  2010    FAMILY HISTORY: Family History  Problem Relation Age of Onset  . Ulcerative colitis Mother        Living  . Breast cancer Paternal Grandmother 35       breast cancer with mastectomy, chemo and radiation  . Diabetes Paternal Grandmother   . Cancer Paternal Grandmother        Breast  . Hyperlipidemia Father        Living  . Breast cancer Maternal Grandmother 43       breast cancer: lumpectomy  . Cancer Maternal Grandmother        Breast  . Heart disease Maternal Grandfather        MI; dissecting aneurysm  . Heart attack Maternal Grandfather   . Hypertension Maternal Grandfather   . Prostate cancer Paternal  Grandfather   . Healthy Sister        x2    SOCIAL HISTORY:  Social History   Socioeconomic History  . Marital status: Married    Spouse name: Not on file  . Number of children: 0  . Years of education: Bachelors  . Highest education level: Not on file  Occupational History  . Occupation: [employer]  Tobacco Use  . Smoking status: Never Smoker  . Smokeless tobacco: Never Used  Vaping Use  . Vaping Use: Never used  Substance and Sexual Activity  . Alcohol use: Yes    Alcohol/week: 0.0 standard drinks    Comment: rare  . Drug use: No  . Sexual activity: Yes    Birth control/protection: Pill  Other Topics Concern  . Not on file  Social History Narrative   Works as Regulatory affairs officer at [employer]   Married   No children   Moved to [place] in 2007 from [place]  Enjoys relaxing.    Right handed    Caffeine use: Coffee every day   Tea very rare   Social Determinants of Health   Financial Resource Strain:   . Difficulty of Paying Living Expenses:   Food Insecurity:   . Worried About Programme researcher, broadcasting/film/video in the Last Year:   . Barista in the Last Year:   Transportation Needs:   . Freight forwarder (Medical):   Marland Kitchen Lack of Transportation (Non-Medical):   Physical Activity:   . Days of Exercise per Week:   . Minutes of Exercise per Session:   Stress:   . Feeling of Stress :   Social Connections:   . Frequency of Communication with Friends and Family:   . Frequency of Social Gatherings with Friends and Family:   . Attends Religious Services:   . Active Member of Clubs or Organizations:   . Attends Banker Meetings:   Marland Kitchen Marital Status:   Intimate Partner Violence:   . Fear of Current or Ex-Partner:   . Emotionally Abused:   Marland Kitchen Physically Abused:   . Sexually Abused:      PHYSICAL EXAM  Vitals:   10/17/19 0947  BP: 110/78  Pulse: 78  Weight: 137 lb (62.1 kg)  Height: 5\' 7"  (1.702 m)    Body mass index is 21.46  kg/m.   General: The patient is well-developed and well-nourished and in no acute distress  Neurologic Exam  Mental status: The patient is alert and oriented x 3 at the time of the examination. The patient has apparent normal recent and remote memory, with an apparently normal attention span and concentration ability.   Speech is normal.  Cranial nerves: Extraocular movements are full.There is good facial sensation to soft touch bilaterally.Facial strength is normal.  Trapezius and sternocleidomastoid strength is normal. No dysarthria is noted. No obvious hearing deficits are noted.  Motor:  Muscle bulk is normal.   Muscle tone is normal.  Strength is normal in the arms and legs.  Sensory: Sensory testing is intact to pinprick, soft touch and vibration sensation in all 4 extremities.  Coordination: Cerebellar testing reveals good finger-nose-finger and heel-to-shin bilaterally.  Gait and station: Station is normal.   Gait and tandem gait are normal.  Romberg is negative. Reflexes: Deep tendon reflexes are symmetric and normal bilaterally.   Plantar responses are flexor.      ASSESSMENT AND PLAN  1. Radiologically isolated syndrome   2. Abnormal finding on MRI of brain   3. Vitamin D deficiency      1.   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  Today, we discussed the significance and the plan going forward.  In January 2022, we will check another MRI and compare with the previous one.  If progression is occurring, the likelihood that she has MS is higher and we will consider a disease modifying therapy. 2.   Stay active. 3.   Return to see me in 6 months or sooner for new or worsening neurologic symptoms.  Andree Heeg A. 10-10-1984, MD, [facility] 10/17/2019, 4:57 PM Certified in Neurology, Clinical Neurophysiology, Sleep Medicine and Neuroimaging  [facility]  [facility] [address], [address] [place], [place] [phone]

## 2019-10-17 NOTE — Telephone Encounter (Signed)
[employer] order sent to GI. They will obtain the auth and reach out to the patient to schedule.  

## 2019-10-18 LAB — VITAMIN D 25 HYDROXY (VIT D DEFICIENCY, FRACTURES): Vit D, 25-Hydroxy: 35.3 ng/mL (ref 30.0–100.0)

## 2019-10-20 NOTE — Telephone Encounter (Signed)
Rutherford Nail: [id number] (exp. 10/18/19 to 01/16/20)

## 2019-10-26 ENCOUNTER — Other Ambulatory Visit: Payer: Self-pay | Admitting: Primary Care

## 2019-10-26 DIAGNOSIS — Z0000 Encounter for general adult medical examination without abnormal findings: Secondary | ICD-10-CM

## 2019-12-01 ENCOUNTER — Other Ambulatory Visit: Payer: Self-pay

## 2019-12-01 ENCOUNTER — Other Ambulatory Visit (INDEPENDENT_AMBULATORY_CARE_PROVIDER_SITE_OTHER): Payer: Managed Care, Other (non HMO)

## 2019-12-01 DIAGNOSIS — Z0000 Encounter for general adult medical examination without abnormal findings: Secondary | ICD-10-CM

## 2019-12-01 LAB — LIPID PANEL
Cholesterol: 120 mg/dL (ref 0–200)
HDL: 53.9 mg/dL (ref >39.00)
LDL Cholesterol: 60 mg/dL (ref 0–99)
NonHDL: 65.74
Total CHOL/HDL Ratio: 2
Triglycerides: 27 mg/dL (ref 0.0–149.0)
VLDL: 5.4 mg/dL (ref 0.0–40.0)

## 2019-12-01 LAB — COMPREHENSIVE METABOLIC PANEL
ALT: 10 U/L (ref 0–35)
AST: 13 U/L (ref 0–37)
Albumin: 4.4 g/dL (ref 3.5–5.2)
Alkaline Phosphatase: 32 U/L — ABNORMAL LOW (ref 39–117)
BUN: 12 mg/dL (ref 6–23)
CO2: 29 mEq/L (ref 19–32)
Calcium: 9.2 mg/dL (ref 8.4–10.5)
Chloride: 103 mEq/L (ref 96–112)
Creatinine, Ser: 0.72 mg/dL (ref 0.40–1.20)
GFR: 93.81 mL/min (ref >60.00)
Glucose, Bld: 91 mg/dL (ref 70–99)
Potassium: 4.5 mEq/L (ref 3.5–5.1)
Sodium: 135 mEq/L (ref 135–145)
Total Bilirubin: 0.5 mg/dL (ref 0.2–1.2)
Total Protein: 7.1 g/dL (ref 6.0–8.3)

## 2019-12-01 LAB — CBC
HCT: 39 % (ref 36.0–46.0)
Hemoglobin: 13.4 g/dL (ref 12.0–15.0)
MCHC: 34.4 g/dL (ref 30.0–36.0)
MCV: 91.5 fl (ref 78.0–100.0)
Platelets: 270 10*3/uL (ref 150.0–400.0)
RBC: 4.26 Mil/uL (ref 3.87–5.11)
RDW: 12.4 % (ref 11.5–15.5)
WBC: 5 10*3/uL (ref 4.0–10.5)

## 2019-12-08 ENCOUNTER — Ambulatory Visit (INDEPENDENT_AMBULATORY_CARE_PROVIDER_SITE_OTHER): Payer: Managed Care, Other (non HMO) | Admitting: Primary Care

## 2019-12-08 ENCOUNTER — Encounter: Payer: Self-pay | Admitting: Primary Care

## 2019-12-08 ENCOUNTER — Other Ambulatory Visit: Payer: Self-pay

## 2019-12-08 VITALS — BP 116/74 | HR 92 | Ht 67.0 in | Wt 138.0 lb

## 2019-12-08 DIAGNOSIS — Z0000 Encounter for general adult medical examination without abnormal findings: Secondary | ICD-10-CM | POA: Diagnosis not present

## 2019-12-08 DIAGNOSIS — F411 Generalized anxiety disorder: Secondary | ICD-10-CM

## 2019-12-08 DIAGNOSIS — J4520 Mild intermittent asthma, uncomplicated: Secondary | ICD-10-CM | POA: Diagnosis not present

## 2019-12-08 DIAGNOSIS — R9089 Other abnormal findings on diagnostic imaging of central nervous system: Secondary | ICD-10-CM

## 2019-12-08 DIAGNOSIS — R200 Anesthesia of skin: Secondary | ICD-10-CM | POA: Diagnosis not present

## 2019-12-08 DIAGNOSIS — R110 Nausea: Secondary | ICD-10-CM

## 2019-12-08 DIAGNOSIS — K219 Gastro-esophageal reflux disease without esophagitis: Secondary | ICD-10-CM | POA: Diagnosis not present

## 2019-12-08 DIAGNOSIS — T753XXA Motion sickness, initial encounter: Secondary | ICD-10-CM | POA: Insufficient documentation

## 2019-12-08 DIAGNOSIS — T753XXS Motion sickness, sequela: Secondary | ICD-10-CM

## 2019-12-08 MED ORDER — ONDANSETRON 4 MG PO TBDP: 4.0000 mg | ORAL_TABLET | Freq: Three times a day (TID) | ORAL | 0 refills | Status: DC | PRN

## 2019-12-08 NOTE — Assessment & Plan Note (Signed)
No concerns, continue to monitor.  

## 2019-12-08 NOTE — Assessment & Plan Note (Signed)
No recent symptoms, infrequent use of albuterol.

## 2019-12-08 NOTE — Patient Instructions (Signed)
Continue exercising. You should be getting 150 minutes of moderate intensity exercise weekly.  Continue to work on a healthy diet. Ensure you are consuming 64 ounces of water daily.  It was a pleasure to see you today!  

## 2019-12-08 NOTE — Assessment & Plan Note (Signed)
Intermittent, following with neurology who is unsure of MS diagnosis. She will be closely monitored. MRI's reviewed.

## 2019-12-08 NOTE — Assessment & Plan Note (Signed)
Following with neurology who is closely monitoring.  Follow up MRI due in December 2021.

## 2019-12-08 NOTE — Progress Notes (Signed)
Subjective:    Patient ID: Penny Hansen, female    DOB: 1985-05-05, 34 y.o.   MRN: [id number]  HPI  This visit occurred during the SARS-CoV-2 public health emergency.  Safety protocols were in place, including screening questions prior to the visit, additional usage of staff PPE, and extensive cleaning of exam room while observing appropriate contact time as indicated for disinfecting solutions.   Penny Hansen is a 34 year old female who presents today for complete physical.  Immunizations: -Tetanus: Unsure  -Influenza: Due this season  -Covid-19: Completed first dose  Diet: She endorses a healthy diet.  Exercise: Exercising regularly  Eye exam: Completed in 2021 Dental exam: Completes semi-annually   Pap Smear: Completed in 2019  BP Readings from Last 3 Encounters:  12/08/19 116/74  10/17/19 110/78  04/20/19 129/79     Review of Systems  Constitutional: Negative for unexpected weight change.  HENT: Negative for rhinorrhea.   Respiratory: Negative for cough and shortness of breath.   Cardiovascular: Negative for chest pain.  Gastrointestinal: Negative for constipation and diarrhea.  Genitourinary: Negative for difficulty urinating and menstrual problem.  Musculoskeletal: Negative for arthralgias and myalgias.  Skin: Negative for rash.  Allergic/Immunologic: Negative for environmental allergies.  Neurological: Negative for dizziness, numbness and headaches.  Psychiatric/Behavioral: The patient is nervous/anxious.        Intermittent anxiety, overall improved, able to manage without Zoloft.        Past Medical History:  Diagnosis Date   Asthma    GERD (gastroesophageal reflux disease)    silent GERD which has caused cough in the past     Social History   Socioeconomic History   Marital status: Significant Other    Spouse name: Not on file   Number of children: 0   Years of education: Bachelors   Highest education level: Not on file   Occupational History   Occupation: [employer]  Tobacco Use   Smoking status: Never Smoker   Smokeless tobacco: Never Used  Building services engineer Use: Never used  Substance and Sexual Activity   Alcohol use: Yes    Alcohol/week: 0.0 standard drinks    Comment: rare   Drug use: No   Sexual activity: Yes    Birth control/protection: Pill  Other Topics Concern   Not on file  Social History Narrative   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    Right handed    Caffeine use: Coffee every day   Tea very rare   Social Determinants of Health   Financial Resource Strain:    Difficulty of Paying Living Expenses: Not on file  Food Insecurity:    Worried About Programme researcher, broadcasting/film/video in the Last Year: Not on file   [employer] of Food in the Last Year: Not on file  Transportation Needs:    Lack of Transportation (Medical): Not on file   Lack of Transportation (Non-Medical): Not on file  Physical Activity:    Days of Exercise per Week: Not on file   Minutes of Exercise per Session: Not on file  Stress:    Feeling of Stress : Not on file  Social Connections:    Frequency of Communication with Friends and Family: Not on file   Frequency of Social Gatherings with Friends and Family: Not on file   Attends Religious Services: Not on  file   Active Member of Clubs or Organizations: Not on file   Attends Banker Meetings: Not on file   Marital Status: Not on file  Intimate Partner Violence:    Fear of Current or Ex-Partner: Not on file   Emotionally Abused: Not on file   Physically Abused: Not on file   Sexually Abused: Not on file    Past Surgical History:  Procedure Laterality Date   TONSILECTOMY/ADENOIDECTOMY WITH MYRINGOTOMY  2012   WISDOM TOOTH EXTRACTION  2010    Family History  Problem Relation Age of Onset   Ulcerative colitis Mother         Living   Breast cancer Paternal Grandmother 53       breast cancer with mastectomy, chemo and radiation   Diabetes Paternal Grandmother    Cancer Paternal Grandmother        Breast   Hyperlipidemia Father        Living   Breast cancer Maternal Grandmother 49       breast cancer: lumpectomy   Cancer Maternal Grandmother        Breast   Heart disease Maternal Grandfather        MI; dissecting aneurysm   Heart attack Maternal Grandfather    Hypertension Maternal Grandfather    Prostate cancer Paternal Grandfather    Healthy Sister        x2    Allergies  Allergen Reactions   Amoxicillin-Pot Clavulanate Other (See Comments)    Gi upset    Current Outpatient Medications on File Prior to Visit  Medication Sig Dispense Refill   Cholecalciferol (VITAMIN D-3) 125 MCG (5000 UT) TABS      albuterol (PROVENTIL HFA;VENTOLIN HFA) 108 (90 Base) MCG/ACT inhaler Inhale 2 puffs into the lungs every 4 (four) hours as needed for wheezing or shortness of breath. 1 Inhaler 0   Ascorbic Acid (VITAMIN C) 100 MG tablet Take 100 mg by mouth as directed.     guaiFENesin (MUCINEX) 600 MG 12 hr tablet Take by mouth 2 (two) times daily.     Multiple Vitamins-Minerals (MULTIVITAMIN GUMMIES WOMENS) CHEW Chew by mouth as directed.     No current facility-administered medications on file prior to visit.    BP 116/74    Pulse 92    Ht 5\' 7"  (1.702 m)    Wt 138 lb (62.6 kg)    LMP 11/28/2019    BMI 21.61 kg/m    Objective:   Physical Exam HENT:     Right Ear: Tympanic membrane and ear canal normal.     Left Ear: Tympanic membrane and ear canal normal.  Eyes:     Pupils: Pupils are equal, round, and reactive to light.  Cardiovascular:     Rate and Rhythm: Normal rate and regular rhythm.  Pulmonary:     Effort: Pulmonary effort is normal.     Breath sounds: Normal breath sounds.  Abdominal:     General: Bowel sounds are normal.     Palpations: Abdomen is soft.      Tenderness: There is no abdominal tenderness.  Musculoskeletal:        General: Normal range of motion.     Cervical back: Neck supple.  Skin:    General: Skin is warm and dry.  Neurological:     Mental Status: She is alert and oriented to person, place, and time.     Cranial Nerves: No cranial nerve deficit.     Deep Tendon  Reflexes:     Reflex Scores:      Patellar reflexes are 2+ on the right side and 2+ on the left side. Psychiatric:        Mood and Affect: Mood normal.            Assessment & Plan:

## 2019-12-08 NOTE — Assessment & Plan Note (Signed)
Immunizations UTD. Pap smear UTD. Commended her on a healthy diet and routine exercise. Exam today unremarkable. Labs reviewed.

## 2019-12-08 NOTE — Assessment & Plan Note (Addendum)
Overall doing well off of Zoloft, does occasionally need Zofran due to anxiety related nausea. Continue to monitor.

## 2019-12-08 NOTE — Assessment & Plan Note (Signed)
Intermittent, mostly in long car rides, refill for Zofran provided.

## 2020-03-27 ENCOUNTER — Telehealth: Payer: Self-pay | Admitting: Neurology

## 2020-03-27 NOTE — Telephone Encounter (Signed)
Rutherford Nail #[id number] - [id number] exp 06/25/2020//. Patient is scheduled at GI for 03/28/20.

## 2020-03-28 ENCOUNTER — Other Ambulatory Visit: Payer: Managed Care, Other (non HMO)

## 2020-03-28 ENCOUNTER — Telehealth: Payer: Self-pay | Admitting: Neurology

## 2020-03-28 ENCOUNTER — Ambulatory Visit
Admission: RE | Admit: 2020-03-28 | Discharge: 2020-03-28 | Disposition: A | Discharge status: Home / Self Care | Payer: Managed Care, Other (non HMO) | Source: Ambulatory Visit | Attending: Neurology | Admitting: Neurology

## 2020-03-28 ENCOUNTER — Other Ambulatory Visit: Payer: Self-pay

## 2020-03-28 DIAGNOSIS — R9089 Other abnormal findings on diagnostic imaging of central nervous system: Secondary | ICD-10-CM

## 2020-03-28 DIAGNOSIS — R930 Abnormal findings on diagnostic imaging of skull and head, not elsewhere classified: Secondary | ICD-10-CM

## 2020-03-28 DIAGNOSIS — R200 Anesthesia of skin: Secondary | ICD-10-CM

## 2020-03-28 IMAGING — MR MR HEAD WO/W CM
16 series · 48 of 48 positions shown · IV contrast (gadavist)
Comparison: None.

CLINICAL DATA: Previous abnormal MRI. Radiologic isolated syndrome.
No MS related neurologic symptoms.

EXAM:
MRI HEAD WITHOUT AND WITH CONTRAST
TECHNIQUE: Multiplanar, multiecho pulse sequences of the brain and surrounding
structures were obtained without and with intravenous contrast.
CONTRAST:  7mL GADAVIST GADOBUTROL 1 MMOL/ML IV SOLN

[Series 5: T1 · sagittal · 4.0mm · 0.75mm/px · 2 of 31 slices shown (1 of 3)]
[im 1/31]
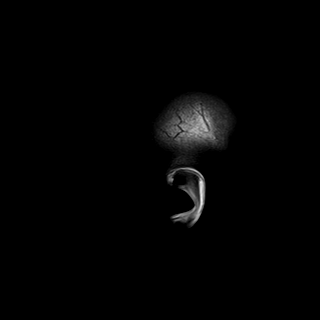
[im 31/31]
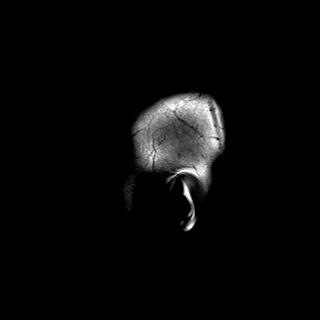

[Series 6: DWI · axial · 3.0mm · 0.94mm/px · z∈[-35,+116]mm · 7 of 188 slices shown (1 of 2)]
[im 1/188]
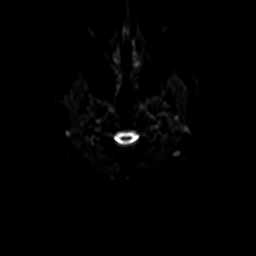
[im 32/188]
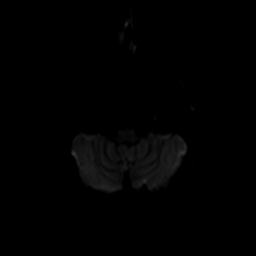
[im 63/188]
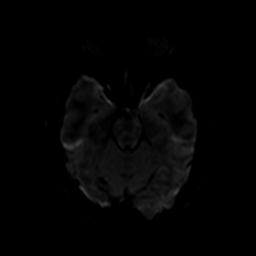
[im 94/188]
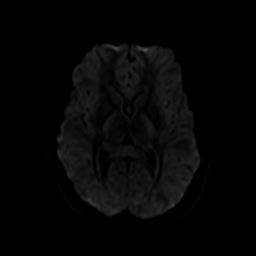
[im 125/188]
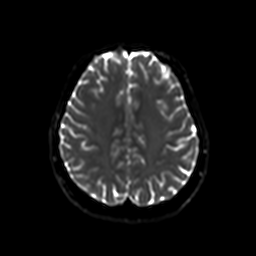
[im 156/188]
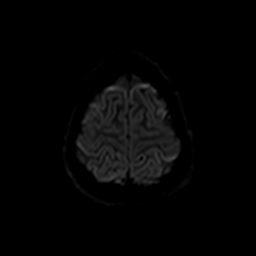
[im 188/188]
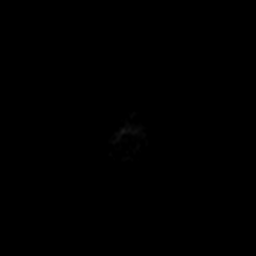

[Series 7: ax dwi_tracew · axial · 3.0mm · 0.94mm/px · z∈[-35,+116]mm · 4 of 94 slices shown]
[im 1/94]
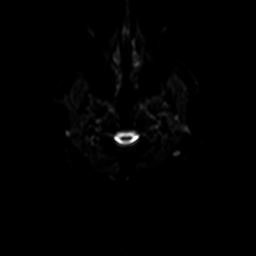
[im 32/94]
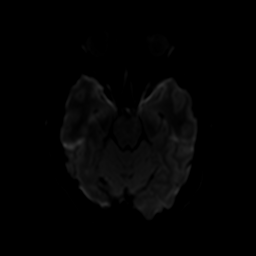
[im 63/94]
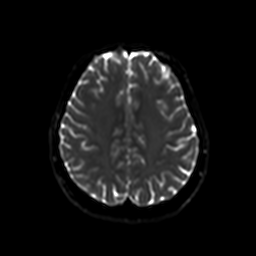
[im 94/94]
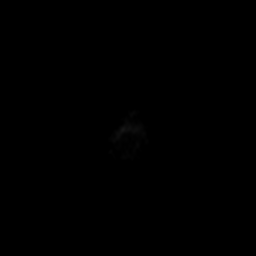

[Series 8: ax dwi_adc · axial · 3.0mm · 0.94mm/px · z∈[-35,+116]mm · 2 of 47 slices shown]
[im 1/47]
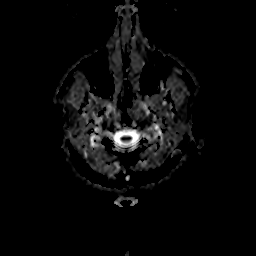
[im 47/47]
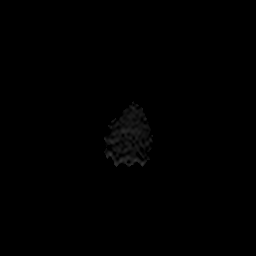

[Series 9: DWI · coronal · 4.0mm · 1.02mm/px · 6 of 170 slices shown (2 of 2)]
[im 1/170]
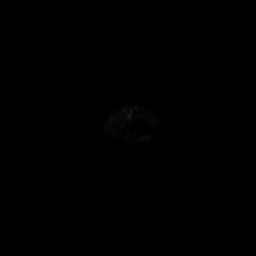
[im 34/170]
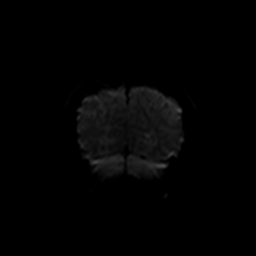
[im 68/170]
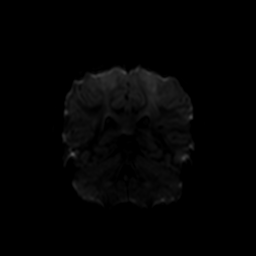
[im 102/170]
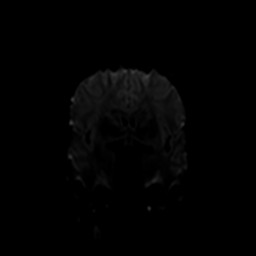
[im 136/170]
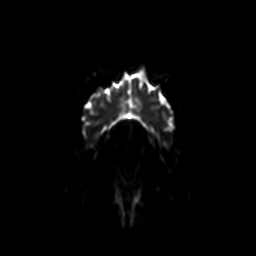
[im 170/170]
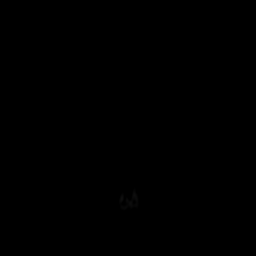

[Series 10: cor dwi_tracew · coronal · 4.0mm · 1.02mm/px · 3 of 85 slices shown]
[im 1/85]
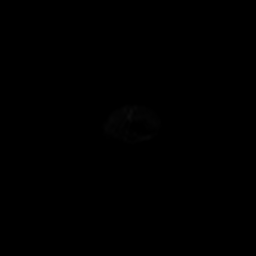
[im 43/85]
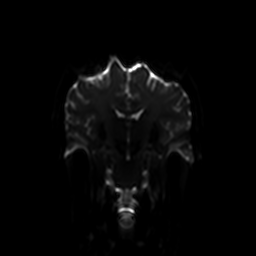
[im 85/85]
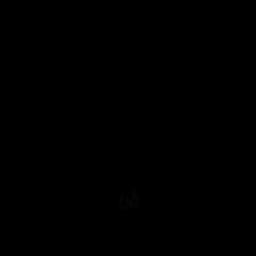

[Series 11: cor dwi_adc · coronal · 4.0mm · 1.02mm/px · 2 of 43 slices shown]
[im 1/43]
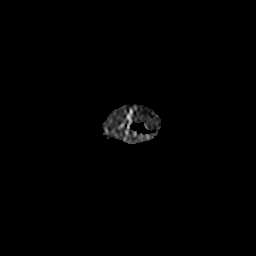
[im 43/43]
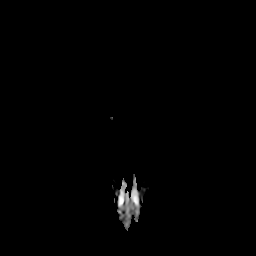

[Series 12: T2 · axial · 4.0mm · 0.36mm/px · 1 of 33 slices shown]
[im 1/33]
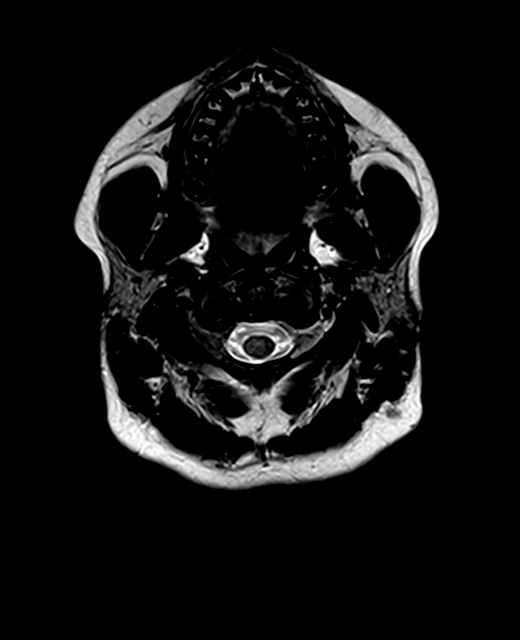

[Series 13: FLAIR · axial · 3.0mm · 0.72mm/px · 1 of 26 slices shown (1 of 2)]
[im 1/26]
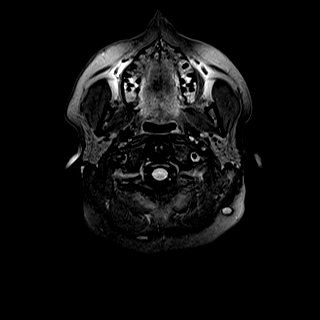

[Series 15: swi_images · axial · 1.5mm · 0.90mm/px · z∈[-38,+104]mm · 4 of 96 slices shown]
[im 1/96]
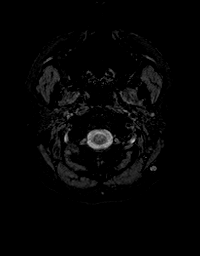
[im 32/96]
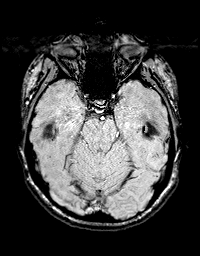
[im 64/96]
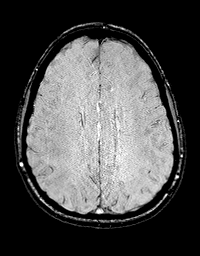
[im 96/96]
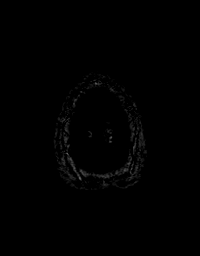

[Series 16: T1 · axial · 1.0mm · 0.94mm/px · z∈[-46,+113]mm · 6 of 160 slices shown (2 of 3)]
[im 1/160]
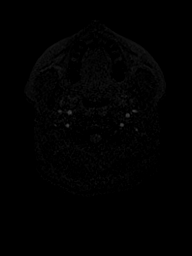
[im 32/160]
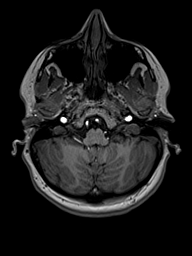
[im 64/160]
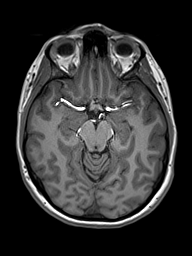
[im 96/160]
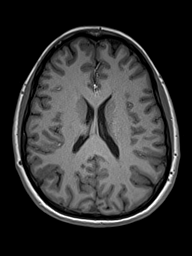
[im 128/160]
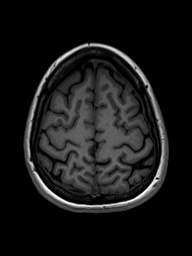
[im 160/160]
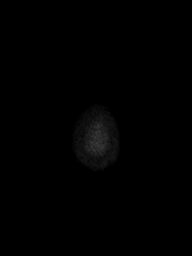

[Series 17: FLAIR · sagittal · 4.0mm · 0.72mm/px · 1 of 30 slices shown (2 of 2)]
[im 1/30]
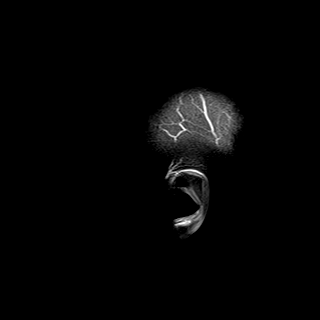

[Series 18: T2 post-contrast · coronal · 4.0mm · 0.36mm/px · 1 of 36 slices shown]
[im 1/36]
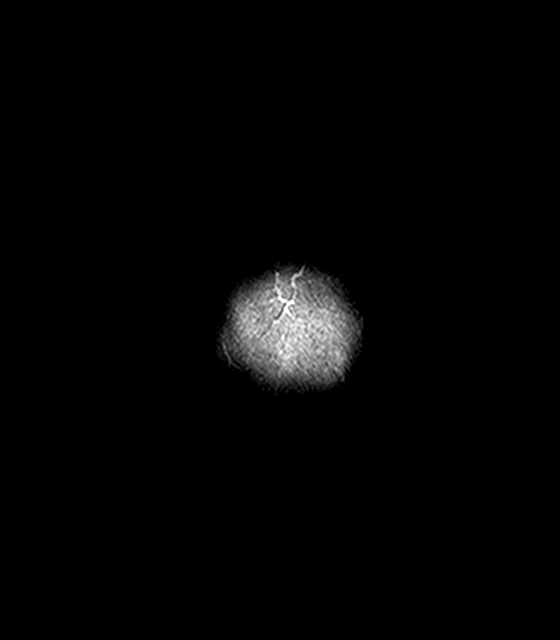

[Series 19: T1 · axial · 1.0mm · 0.94mm/px · z∈[-46,+113]mm · 6 of 160 slices shown (3 of 3)]
[im 1/160]
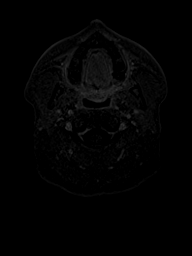
[im 32/160]
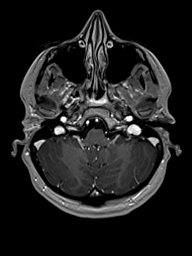
[im 64/160]
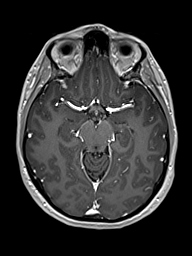
[im 96/160]
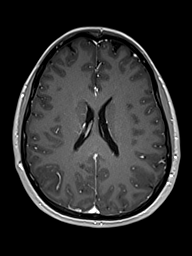
[im 128/160]
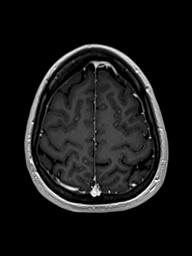
[im 160/160]
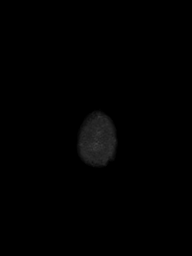

[Series 20: T1 post-contrast · coronal · 4.0mm · 0.72mm/px · 1 of 36 slices shown (1 of 2)]
[im 1/36]
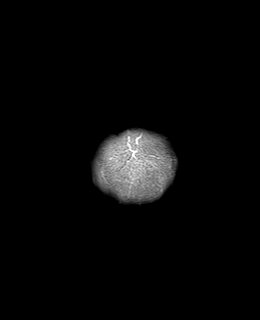

[Series 21: T1 post-contrast · sagittal · 4.0mm · 0.75mm/px · 1 of 31 slices shown (2 of 2)]
[im 1/31]
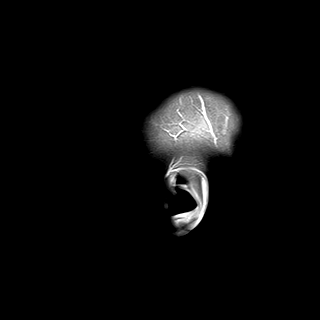

[48 of 48 positions shown; findings below may reference images not displayed]

FINDINGS: Brain: Previously noted subcortical T2 hyperintensities in the left
parietal lobe and right temporal lobe are stable. Bilateral
periventricular T2 hyperintensities extending to the callososeptal
margin are stable. A new lesion is present within the splenium of
the corpus callosum measuring up to 5 mm. There is no restricted
diffusion or enhancement associated with this lesion. A punctate T2
hyperintensity is present the anterior right frontal lobe on image
29 of series 13, the FLAIR sequence. This may be new is well. It is
not clearly seen on other sequences. No enhancement is associated
with this lesion.

No acute infarct, hemorrhage, or mass lesion is present. Cortex is
within normal limits. The ventricles are of normal size. No
significant extraaxial fluid collection is present.

The internal auditory canals are within normal limits. The brainstem
and cerebellum are within normal limits.

Vascular: Flow is present in the major intracranial arteries.

Skull and upper cervical spine: The craniocervical junction is
normal. Upper cervical spine is within normal limits. Marrow signal
is unremarkable.

Sinuses/Orbits: The paranasal sinuses and mastoid air cells are
clear. The globes and orbits are within normal limits. Optic nerves
are within normal limits bilaterally.
IMPRESSION: 1. New 5 mm lesion within the splenium of the corpus callosum. There
is no restricted diffusion or enhancement associated with this
lesion. This raises additional concern for an underlying
demyelinating process such as multiple sclerosis.
2. Possible new punctate T2 hyperintensity in the anterior right
frontal lobe. It is not clearly seen on other sequences.
3. Stable appearance of other previously noted subcortical and
periventricular T2 hyperintensities consistent radiologically
isolated syndrome, concerning for multiple sclerosis.

## 2020-03-28 MED ORDER — GADOBUTROL 1 MMOL/ML IV SOLN
7.0000 mL | Freq: Once | INTRAVENOUS | Status: AC | PRN
  Administered 2020-03-28: 7 mL via INTRAVENOUS

## 2020-03-28 NOTE — Telephone Encounter (Signed)
I spoke to [facility] about the new MRI.  It clearly shows a new lesion in the periventricular splenium of the corpus callosum that was not present on the MRI from 05/05/2019.  It does not appear to enhance so is probably not acute.  A second punctate focus in the right frontal lobe that was not present on the previous MRI is actually present on an earlier MRI from 2020.  Other lesions including periventricular foci were present on previous MRIs  I discussed the significance with Myrissa.  A new focus consistent with MS greatly increases the likelihood that she has multiple sclerosis though she is officially still radiologically isolated syndrome.  None of the foci or in part of the brain that are likely to lead to symptoms which is probably why she has not had any relapse type symptom.  At this point, I feel we should proceed with a lumbar puncture to get additional information.  If she does have oligoclonal bands I would recommend that we begin a disease modifying therapy and we would discuss this further at the next visit that is scheduled in a few weeks.

## 2020-04-12 ENCOUNTER — Other Ambulatory Visit: Payer: Self-pay

## 2020-04-12 ENCOUNTER — Ambulatory Visit
Admission: RE | Admit: 2020-04-12 | Discharge: 2020-04-12 | Disposition: A | Discharge status: Home / Self Care | Payer: Managed Care, Other (non HMO) | Source: Ambulatory Visit | Attending: Neurology | Admitting: Neurology

## 2020-04-12 ENCOUNTER — Other Ambulatory Visit: Payer: Managed Care, Other (non HMO)

## 2020-04-12 VITALS — BP 128/83 | HR 93

## 2020-04-12 DIAGNOSIS — R9089 Other abnormal findings on diagnostic imaging of central nervous system: Secondary | ICD-10-CM

## 2020-04-12 DIAGNOSIS — R930 Abnormal findings on diagnostic imaging of skull and head, not elsewhere classified: Secondary | ICD-10-CM

## 2020-04-12 DIAGNOSIS — R200 Anesthesia of skin: Secondary | ICD-10-CM

## 2020-04-12 NOTE — Discharge Instructions (Signed)

## 2020-04-15 ENCOUNTER — Other Ambulatory Visit: Payer: Self-pay | Admitting: Neurology

## 2020-04-15 ENCOUNTER — Telehealth: Payer: Self-pay | Admitting: Neurology

## 2020-04-15 MED ORDER — BUTALBITAL-APAP-CAFFEINE 50-325-40 MG PO TABS: 1.0000 | ORAL_TABLET | Freq: Four times a day (QID) | ORAL | 0 refills | Status: DC | PRN

## 2020-04-15 NOTE — Telephone Encounter (Signed)
Patient reporting post-LP headache. Advised we can send her for blood patch tomorrow. In the meantime lay flat, caffeine,fluids and I will call in some fioricet to take until tomorrow when Kara Mead can call in the blood patch in the morning and call [facility](she works at [employer]).

## 2020-04-16 ENCOUNTER — Other Ambulatory Visit: Payer: Self-pay | Admitting: Neurology

## 2020-04-16 ENCOUNTER — Ambulatory Visit
Admission: RE | Admit: 2020-04-16 | Discharge: 2020-04-16 | Disposition: A | Discharge status: Home / Self Care | Payer: Managed Care, Other (non HMO) | Source: Ambulatory Visit | Attending: Neurology | Admitting: Neurology

## 2020-04-16 ENCOUNTER — Other Ambulatory Visit: Payer: Self-pay

## 2020-04-16 ENCOUNTER — Telehealth: Payer: Self-pay | Admitting: Neurology

## 2020-04-16 DIAGNOSIS — G971 Other reaction to spinal and lumbar puncture: Secondary | ICD-10-CM

## 2020-04-16 IMAGING — XA Imaging study
2 series · 2 of 2 positions shown · non-contrast
Comparison: none

CLINICAL DATA: Lumbar puncture 4 days ago.  Positional headache.

[Series 2: ortho standard · 1 of 1 slices shown (1 of 2)]
[im 1/1]
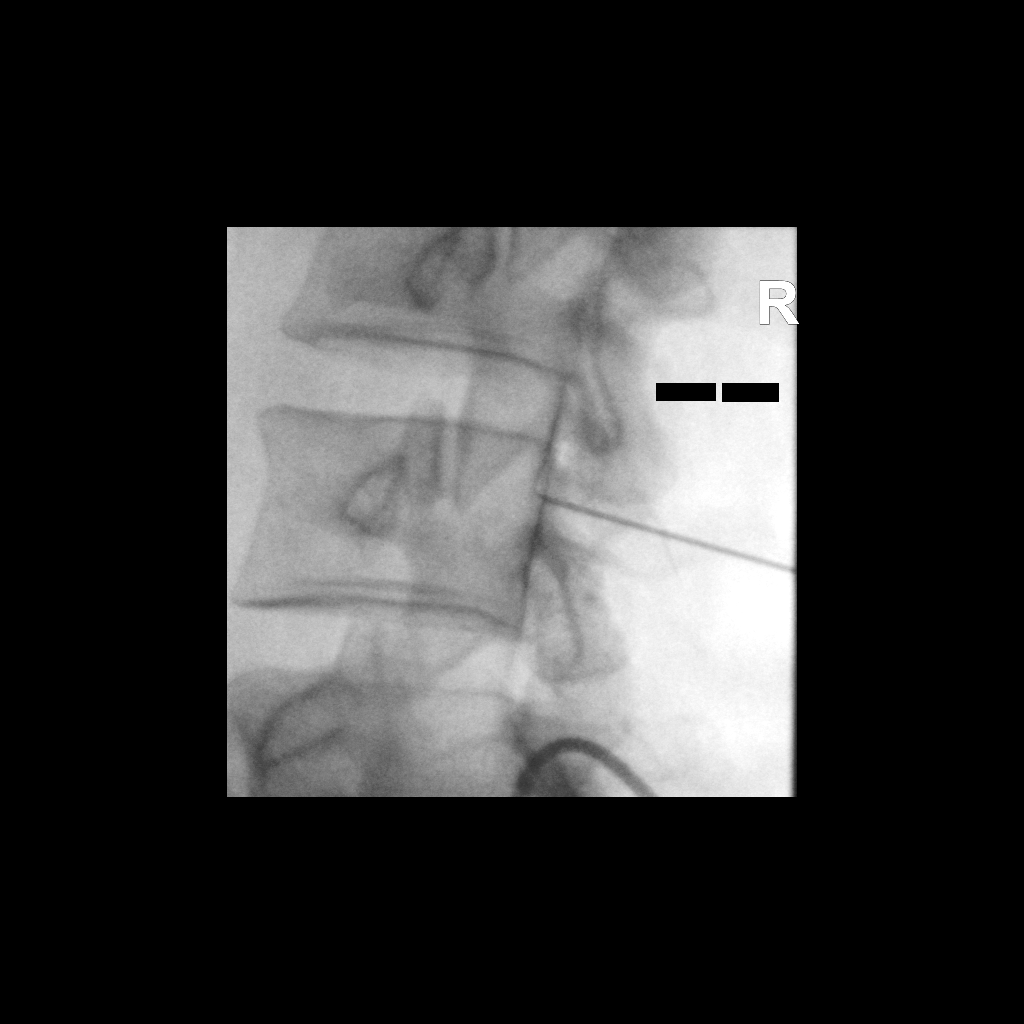

[Series 3: ortho standard · 1 of 1 slices shown (2 of 2)]
[im 1/1]
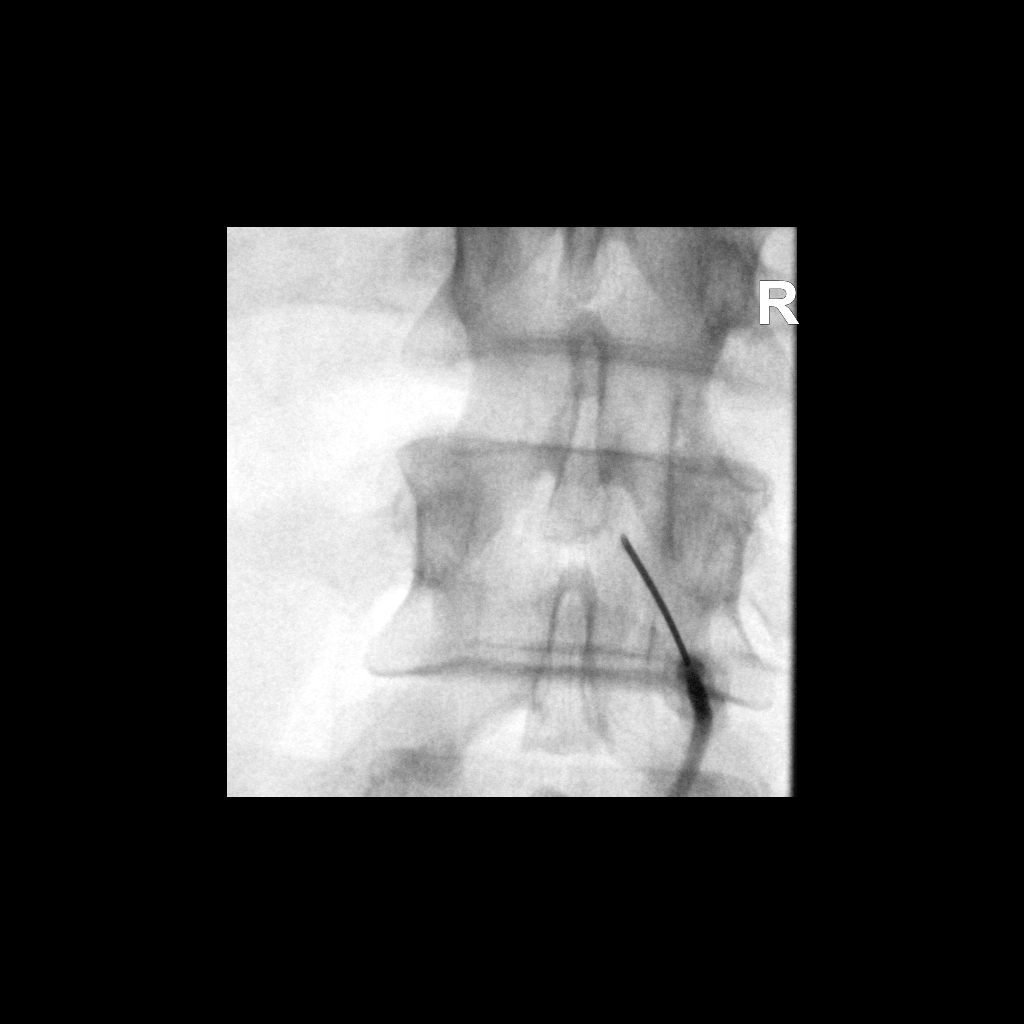

[2 of 2 positions shown; findings below may reference images not displayed]

FLUOROSCOPY TIME:  0 minutes 13 seconds. 8.88 micro gray meter
squared

PROCEDURE:
LUMBAR EPIDURAL BLOOD PATCH INJECTION

After a thorough discussion of risks and benefits of the procedure,
written and verbal consent was obtained. Specific risks included
puncture of the thecal sac and dura as well as nontherapeutic
injection with general risks of bleeding, infection, injury to
nerves, blood vessels, and adjacent structures. Verbal consent was
obtained.

Prior to the procedure, 20 ml of the patient's blood was drawn from
the right antecubital fossa by myself.

An interlaminar approach was performed at right L3-4. Under
stringent sterile technique, overlying skin was cleansed with
betadine soap and anesthetized with 1% lidocaine without
epinephrine. A standard 20 gauge needle was advanced using
loss-of-resistance technique.

DIAGNOSTIC EPIDURAL INJECTION

Injection of Isovue-M 200 shows a good epidural pattern with spread
above and below the level of needle placement, primarily on the side
of needle placement. No vascular or subarachnoid opacification is
seen.

THERAPEUTIC EPIDURAL INJECTION

20 ml of the patient's blood was injected into the epidural space at
the site of prior lumbar puncture.
IMPRESSION: Technically successful lumbar blood patch at right L3-4. Patient was
instructed to lie down for an additional day and to restrict
activities tomorrow.

## 2020-04-16 MED ORDER — IOPAMIDOL (ISOVUE-M 200) INJECTION 41%
1.0000 mL | Freq: Once | INTRAMUSCULAR | Status: AC
  Administered 2020-04-16: 1 mL via EPIDURAL

## 2020-04-16 NOTE — Telephone Encounter (Signed)
Pt. asks that RN gives her a call back asap.

## 2020-04-16 NOTE — Discharge Instructions (Signed)

## 2020-04-16 NOTE — Telephone Encounter (Signed)
Called patient who stated she feels sensitive in head, neck the longer she's up. Fioricet helped a lot but as soon as it wears off the symptoms come back and it makes her drowsy. Ibuprofen didn't really help. She has pain with cough, using the bathroom. Advised Dr Epimenio Foot who stated he'll order epidural blood patch. I advised patient and she'll wait to hear from [facility] to schedule it. I advised she let us know if any other questions, concerns. Patient verbalized understanding, appreciation.

## 2020-04-18 LAB — OLIGOCLONAL BANDS, CSF + SERM

## 2020-04-18 LAB — CSF CELL COUNT WITH DIFFERENTIAL
RBC Count, CSF: 2 cells/uL — ABNORMAL HIGH
WBC, CSF: 0 cells/uL (ref 0–5)

## 2020-04-18 LAB — CNS IGG SYNTHESIS RATE, CSF+BLOOD
Albumin Serum: 4.8 g/dL (ref 3.5–5.2)
Albumin, CSF: 14 mg/dL (ref 8.0–42.0)
CNS-IgG Synthesis Rate: 11.6 mg/24 h — ABNORMAL HIGH (ref <=3.3)
IgG (Immunoglobin G), Serum: 1140 mg/dL (ref 600–1640)
IgG Total CSF: 4.7 mg/dL (ref 0.8–7.7)
IgG-Index: 1.41 — ABNORMAL HIGH (ref <0.66)

## 2020-04-18 LAB — VDRL, CSF: VDRL Quant, CSF: NONREACTIVE

## 2020-04-18 LAB — GLUCOSE, CSF: Glucose, CSF: 47 mg/dL (ref 40–80)

## 2020-04-18 LAB — PROTEIN, CSF: Total Protein, CSF: 23 mg/dL (ref 15–45)

## 2020-04-18 NOTE — Telephone Encounter (Signed)
The spinal fluid does show elevated IgG index and oligoclonal bands (greater than 5).  Therefore, I believe she does have multiple sclerosis and is fortunate to have been diagnosed at an early point in time.  She has an appointment to see me tomorrow to further discuss treatment options

## 2020-04-19 ENCOUNTER — Encounter: Payer: Self-pay | Admitting: Neurology

## 2020-04-19 ENCOUNTER — Ambulatory Visit: Payer: Managed Care, Other (non HMO) | Admitting: Neurology

## 2020-04-19 VITALS — BP 116/72 | HR 72 | Ht 67.0 in | Wt 140.0 lb

## 2020-04-19 DIAGNOSIS — Z79899 Other long term (current) drug therapy: Secondary | ICD-10-CM | POA: Diagnosis not present

## 2020-04-19 DIAGNOSIS — G35 Multiple sclerosis: Secondary | ICD-10-CM | POA: Diagnosis not present

## 2020-04-19 DIAGNOSIS — R200 Anesthesia of skin: Secondary | ICD-10-CM | POA: Diagnosis not present

## 2020-04-19 NOTE — Progress Notes (Signed)
[facility]  PATIENT: Penny Hansen DOB: 03-16-1986  REFERRING DOCTOR OR PCP: Vernona RiegerKatherine Clark, NP SOURCE: Patient, notes from PCP, MRI images personally reviewed.  _________________________________   HISTORICAL  CHIEF COMPLAINT:  Chief Complaint  Patient presents with  . Follow-up    RM 12, alone. Last seen 10/17/2019. MS confirmed by LP. Here to discuss DMT options.    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    Update 04/19/2020:  Follow-up MRI performed December 2021 showed 2 new foci that were not present on the 05/05/2019.  One was in the splenium of the corpus callosum and the other was a punctate focus in the right frontal lobe.  Other foci were unchanged and included periventricular lesions.  Due to the new lesions, the possibility of MS with increase and she proceeded to have a lumbar puncture 04/12/2020.  It was abnormal showing oligoclonal bands and elevated IgG index.  With a combination of some sensory symptoms, MRI changes and CSF findings, she meets criteria for relapsing remitting multiple sclerosis.  We discussed initiating a therapy to help prevent new lesions and reduce the likelihood of relapses and progression.  We discussed several disease modifying therapies.  We spent the most time discussing Mavenclad, Vumerity and Copaxone.  She is not married at this time but is in a serious relationship and there is a high likelihood that she will want to have a pregnancy in the next couple of years.  Each of the medications be discussed in more detail has potential risks and benefits.  Mavenclad would allow her to have a large window where pregnancy could occur without having to have medication on board.  We did discuss that any clinical studies there was an increased risk of cancer seen.  Copaxone is generally felt to be safe during pregnancy though with her mild MS we would also consider stopping it.  Vumerity has a  short half-life and if she did become pregnant or if she was planning to become pregnant it could be stopped.  We also discussed the risks and benefits of the medications.  She will give this more thought.  We will check some blood work today and to use among the medications next week when results return.  She has had a sensation in her right hand and wrist lasting a few hours with pain and allodynia. She alsogets sensations in the right arm off/on.     In 2014, she had numbness in the right flank lasting 3-4 days.     She notes her eyes strain easily when she drives  MS history: In June 2020, she had an MRI of the brain as part of a "dummy run" for an MS study.  It was abnormal showing foci consistent with MS.  In retrospect, back in 2014, she had some tingling in the right flank that lasted about 3 days.   She was working long shifts so assumed it was a muscle strain.  She also has had some sensory symptoms in the right arm.  She had additional MRI of the spine 10/11/2018 that showed a normal spinal cord.  Repeat MRI of the brain was performed 05/05/2019 and showed no new lesions.  Repeat MRI of the brain 03/28/2020 showed 2 new foci, one of the periventricular splenium of the corpus callosum and another in the right frontal lobe.  She then had a lumbar puncture 04/12/2020.  It was abnormal showing oligoclonal bands and  elevated IgG index.  Imaging and other studies: MRI 09/13/2018 showed T2/FLAIR hyperintense foci in the hemispheres including the periventricular and juxtacortical white matter.  None of the foci look to be acute.  MRI brain 10/11/2018 was unchanged.  Normal enhancement pattern.  MRI of the cervical and thoracic spine 10/11/2018 showed a normal spinal cord.  MRI of the brain 05/05/2019 showed no new lesions  MRI of the brain 03/28/2020 showed 2 new foci, 1 in the periventricular splenium and another in the right frontal lobe.  These do not enhance.  Echocardiogram with bubbles 09/30/2018  showed some bubbles crossing late (not c/w ASD/PFO but could be pulmonary AVMs.   Follow up transcranial dopplers with agitated saline showed no bubbles.     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   REVIEW OF SYSTEMS: Constitutional: No fevers, chills, sweats, or change in appetite Eyes: No visual changes, double vision, eye pain Ear, nose and throat: No hearing loss, ear pain, nasal congestion, sore throat Cardiovascular: No chest pain, palpitations Respiratory: No shortness of breath at rest or with exertion.   No wheezes GastrointestinaI: No nausea, vomiting, diarrhea, abdominal pain, fecal incontinence Genitourinary: No dysuria, urinary retention or frequency.  No nocturia. Musculoskeletal: No neck pain, back pain Integumentary: No rash, pruritus, skin lesions Neurological: as above Psychiatric: No depression at this time.  No anxiety Endocrine: No palpitations, diaphoresis, change in appetite, change in weigh or increased thirst Hematologic/Lymphatic: No anemia, purpura, petechiae. Allergic/Immunologic: No itchy/runny eyes, nasal congestion, recent allergic reactions, rashes  ALLERGIES: Allergies  Allergen Reactions  . Amoxicillin-Pot Clavulanate Other (See Comments)    Gi upset    HOME MEDICATIONS:  Current Outpatient Medications:  .  albuterol (PROVENTIL HFA;VENTOLIN HFA) 108 (90 Base) MCG/ACT inhaler, Inhale 2 puffs into the lungs every 4 (four) hours as needed for wheezing or shortness of breath., Disp: 1 Inhaler, Rfl: 0 .  Ascorbic Acid (VITAMIN C) 100 MG tablet, Take 100 mg by mouth as directed., Disp: , Rfl:  .  Cholecalciferol (VITAMIN D-3) 125 MCG (5000 UT) TABS, , Disp: , Rfl:  .  guaiFENesin (MUCINEX) 600 MG 12 hr tablet, Take by mouth 2 (two) times daily., Disp: , Rfl:  .  Multiple Vitamins-Minerals (MULTIVITAMIN GUMMIES WOMENS) CHEW, Chew by mouth as directed., Disp:  , Rfl:  .  ondansetron (ZOFRAN ODT) 4 MG disintegrating tablet, Take 1 tablet (4 mg total) by mouth every 8 (eight) hours as needed for nausea or vomiting., Disp: 15 tablet, Rfl: 0  PAST MEDICAL HISTORY: Past Medical History:  Diagnosis Date  . Asthma   . GERD (gastroesophageal reflux disease)    silent GERD which has caused cough in the past    PAST SURGICAL HISTORY: Past Surgical History:  Procedure Laterality Date  . TONSILECTOMY/ADENOIDECTOMY WITH MYRINGOTOMY  2012  . WISDOM TOOTH EXTRACTION  2010    FAMILY HISTORY: Family History  Problem Relation Age of Onset  . Ulcerative colitis Mother        Living  . Breast cancer Paternal Grandmother 57       breast cancer with mastectomy, chemo and radiation  . Diabetes Paternal Grandmother   . Cancer Paternal Grandmother        Breast  . Hyperlipidemia Father        Living  . Breast cancer Maternal Grandmother 55       breast cancer: lumpectomy  . Cancer Maternal Grandmother  Breast  . Heart disease Maternal Grandfather        MI; dissecting aneurysm  . Heart attack Maternal Grandfather   . Hypertension Maternal Grandfather   . Prostate cancer Paternal Grandfather   . Healthy Sister        x2    SOCIAL HISTORY:  Social History   Socioeconomic History  . Marital status: Significant Other    Spouse name: Not on file  . Number of children: 0  . Years of education: Bachelors  . Highest education level: Not on file  Occupational History  . Occupation: [employer]  Tobacco Use  . Smoking status: Never Smoker  . Smokeless tobacco: Never Used  Vaping Use  . Vaping Use: Never used  Substance and Sexual Activity  . Alcohol use: Yes    Alcohol/week: 0.0 standard drinks    Comment: rare  . Drug use: No  . Sexual activity: Yes    Birth control/protection: Pill  Other Topics Concern  . Not on file  Social History Narrative   Works as Regulatory affairs officer at [employer]   Married   No children    Moved to [place] in 2007 from [place]   Enjoys relaxing.    Right handed    Caffeine use: Coffee every day   Tea very rare   Social Determinants of Health   Financial Resource Strain: Not on file  Food Insecurity: Not on file  Transportation Needs: Not on file  Physical Activity: Not on file  Stress: Not on file  Social Connections: Not on file  Intimate Partner Violence: Not on file     PHYSICAL EXAM  Vitals:   04/19/20 1049  BP: 116/72  Pulse: 72  Weight: 140 lb (63.5 kg)  Height: 5\' 7"  (1.702 m)    Body mass index is 21.93 kg/m.   General: The patient is well-developed and well-nourished and in no acute distress  Neurologic Exam  Mental status: The patient is alert and oriented x 3 at the time of the examination. The patient has apparent normal recent and remote memory, with an apparently normal attention span and concentration ability.   Speech is normal.  Cranial nerves: Extraocular movements are full.There is good facial sensation to soft touch bilaterally.Facial strength is normal.  Trapezius and sternocleidomastoid strength is normal. No dysarthria is noted. No obvious hearing deficits are noted.  Motor:  Muscle bulk is normal.   Muscle tone is normal.  Strength is normal in the arms and legs.  Sensory: Sensory testing is intact to pinprick, soft touch and vibration sensation in all 4 extremities.  Coordination: Cerebellar testing reveals good finger-nose-finger and heel-to-shin bilaterally.  Gait and station: Station is normal.   Gait and tandem gait are normal.  Romberg is negative. Reflexes: Deep tendon reflexes are symmetric and normal bilaterally.   Plantar responses are flexor.      ASSESSMENT AND PLAN  1. Relapsing remitting multiple sclerosis (HCC)   2. Numbness   3. High risk medication use      1.   since the last visit she has had an MRI showing 2 additional white matter lesions consistent with MS.  Additionally lumbar puncture showed  greater than 5 oligoclonal bands and elevated IgG index.  Therefore, she meets criteria for relapsing remitting MS.  We discussed several treatment options and spent most of the time discussing Mavenclad, Vumerity and Copaxone.  We will check blood work and she will give the options more thought over the weekend.  I will speak to her next week so that we can initiate treatment.   2.   Stay active. 3.   Return to see me in 6 months or sooner for new or worsening neurologic symptoms.  50-minute office visit with the majority of the time spent face-to-face for history and physical, discussion/counseling and decision-making.  Additional time with record review and documentation.  Sirius Woodford A. Epimenio Foot, MD, [facility] 04/19/2020, 5:07 PM Certified in Neurology, Clinical Neurophysiology, Sleep Medicine and Neuroimaging  [facility] [address], [address] [place], [place] [address] [phone]

## 2020-04-21 LAB — CBC WITH DIFFERENTIAL/PLATELET
Basophils Absolute: 0.1 10*3/uL (ref 0.0–0.2)
Basos: 1 %
EOS (ABSOLUTE): 0.1 10*3/uL (ref 0.0–0.4)
Eos: 1 %
Hematocrit: 41 % (ref 34.0–46.6)
Hemoglobin: 14 g/dL (ref 11.1–15.9)
Immature Grans (Abs): 0 10*3/uL (ref 0.0–0.1)
Immature Granulocytes: 0 %
Lymphocytes Absolute: 1.8 10*3/uL (ref 0.7–3.1)
Lymphs: 33 %
MCH: 30.9 pg (ref 26.6–33.0)
MCHC: 34.1 g/dL (ref 31.5–35.7)
MCV: 91 fL (ref 79–97)
Monocytes Absolute: 0.4 10*3/uL (ref 0.1–0.9)
Monocytes: 8 %
Neutrophils Absolute: 3.1 10*3/uL (ref 1.4–7.0)
Neutrophils: 57 %
Platelets: 346 10*3/uL (ref 150–450)
RBC: 4.53 x10E6/uL (ref 3.77–5.28)
RDW: 11.8 % (ref 11.7–15.4)
WBC: 5.5 10*3/uL (ref 3.4–10.8)

## 2020-04-21 LAB — HEPATITIS C ANTIBODY: Hep C Virus Ab: 0.1 s/co ratio (ref 0.0–0.9)

## 2020-04-21 LAB — HEPATIC FUNCTION PANEL
ALT: 14 IU/L (ref 0–32)
AST: 16 IU/L (ref 0–40)
Albumin: 5.2 g/dL — ABNORMAL HIGH (ref 3.8–4.8)
Alkaline Phosphatase: 52 IU/L (ref 44–121)
Bilirubin Total: 0.5 mg/dL (ref 0.0–1.2)
Bilirubin, Direct: 0.12 mg/dL (ref 0.00–0.40)
Total Protein: 7.8 g/dL (ref 6.0–8.5)

## 2020-04-21 LAB — VARICELLA ZOSTER ANTIBODY, IGG: Varicella zoster IgG: 1487 index (ref >165)

## 2020-04-21 LAB — HEPATITIS B SURFACE ANTIBODY,QUALITATIVE: Hep B Surface Ab, Qual: REACTIVE

## 2020-04-21 LAB — HIV ANTIBODY (ROUTINE TESTING W REFLEX): HIV Screen 4th Generation wRfx: NONREACTIVE

## 2020-04-21 LAB — QUANTIFERON-TB GOLD PLUS
QuantiFERON Mitogen Value: >10 IU/mL
QuantiFERON Nil Value: 0.06 IU/mL
QuantiFERON TB1 Ag Value: 0.05 IU/mL
QuantiFERON TB2 Ag Value: 0.04 IU/mL
QuantiFERON-TB Gold Plus: NEGATIVE

## 2020-04-21 LAB — HEPATITIS B CORE ANTIBODY, TOTAL: Hep B Core Total Ab: NEGATIVE

## 2020-04-21 LAB — HEPATITIS B SURFACE ANTIGEN: Hepatitis B Surface Ag: NEGATIVE

## 2020-04-25 ENCOUNTER — Telehealth: Payer: Self-pay | Admitting: *Deleted

## 2020-04-25 NOTE — Telephone Encounter (Signed)
Faxed completed/signed glatiramer acetate 40mg /ml start form to [facility] at [phone]. Received fax confirmation.

## 2020-04-25 NOTE — Telephone Encounter (Signed)
Submitted PA on CMM. Key: [id number]. Received instant approval: "CaseId:[id number];Status:Approved;Review Type:Prior Auth;Coverage Start Date:04/25/2020;Coverage End Date:04/25/2021"

## 2020-04-30 NOTE — Telephone Encounter (Signed)
I called [facility].  Patient's glatiramer shipment will be coming from [facility] and should be delivered on May 01, 2020.

## 2020-05-03 NOTE — Telephone Encounter (Signed)
Pt. is requesting a call from RN to find out when she should start medication because of how the dosing is spread out. Please advise.

## 2020-05-03 NOTE — Telephone Encounter (Addendum)
Art gallery manager with glat[employer]amer. This is who we sent her start form to. Spoke with [employer]. Confirmed they processed copay, sent out whisperject and welcome kit. Pt declined injection training from RN that we requested on start form. Pt advised she would watch video and call back if she has further questions.   I called pt back. Advised she can start medication now. She is going to take glatiramer 39m as follows: Every Sunday, Tuesday, Thursday. Advised her to make sure doses are at least 48 hrs apart. She will take first dose today. She will follow back up if she has any more questions/concerns.

## 2020-05-04 ENCOUNTER — Telehealth: Payer: Self-pay | Admitting: Neurology

## 2020-05-04 NOTE — Telephone Encounter (Signed)
Pt called with injection site reaction with first dose. Mild swelling. Now resolved. Patient will try a different site on Sunday injection. -VRP

## 2020-05-04 NOTE — Telephone Encounter (Signed)
Pt calling asking to speak with Dr re: reaction she has had to Glatiramer Acetate 40 MG/ML SOSY.  Pt said her next injection is due Sunday but she would like to speak with a Dr before to see if she should not take or if this reaction is her body getting use to the medication.  Please call at [phone]

## 2020-05-14 NOTE — Telephone Encounter (Signed)
Patient returned my call.  She reports that other than mild swelling from her glatiramer injections she is tolerating it well.  She had discussed this with Dr. Marjory Lies last week and he felt it was safe for her to continue the injections but just use a different site.  She will let us know if she has further issues locally at the injection site.  She will also let us know if she has further questions or concerns.  She will keep her appointment as scheduled in April with Dr. Epimenio Foot.

## 2020-05-14 NOTE — Telephone Encounter (Signed)
I called patient to discuss if she has received her glatiramer injections and is tolerating it well.  No answer, left a voicemail asking her to call me back.

## 2020-05-31 ENCOUNTER — Other Ambulatory Visit: Payer: Self-pay

## 2020-05-31 ENCOUNTER — Encounter: Payer: Self-pay | Admitting: Primary Care

## 2020-05-31 ENCOUNTER — Ambulatory Visit: Payer: Managed Care, Other (non HMO) | Admitting: Primary Care

## 2020-05-31 VITALS — BP 118/80 | HR 92 | Temp 97.8°F | Ht 67.0 in | Wt 140.5 lb

## 2020-05-31 DIAGNOSIS — F411 Generalized anxiety disorder: Secondary | ICD-10-CM

## 2020-05-31 DIAGNOSIS — G35 Multiple sclerosis: Secondary | ICD-10-CM | POA: Diagnosis not present

## 2020-05-31 MED ORDER — HYDROXYZINE HCL 10 MG PO TABS
10.0000 mg | ORAL_TABLET | Freq: Two times a day (BID) | ORAL | 0 refills | Status: DC | PRN
Start: 2020-05-31 — End: 2021-01-30

## 2020-05-31 NOTE — Assessment & Plan Note (Signed)
Follow with neurology, doing well on Glatiramer acetate injections 3 times weekly.  Continue same.

## 2020-05-31 NOTE — Progress Notes (Signed)
Subjective:    Patient ID: Penny Hansen, female    DOB: 05/27/1985, 35 y.o.   MRN: [id number]  HPI  This visit occurred during the SARS-CoV-2 public health emergency.  Safety protocols were in place, including screening questions prior to the visit, additional usage of staff PPE, and extensive cleaning of exam room while observing appropriate contact time as indicated for disinfecting solutions.   Penny Hansen is a 35 year old female with a history of anxiety, multiple sclerosis, fatigue who presents today to discuss anxiety.  She's been under a lot of stress with her new MS diagnosis and her sisters new diagnosis of breast cancer who started chemotherapy today. She is managed now on Glatiramer acetate injections three times weekly for MS treatment.  Her mother was diagnosed with breast cancer last year.  Symptoms include feeling anxious, feeling jittery, feeling tense, losing focus at work for the last three months. She's having more good days than bad, but last week she experienced four bad days. Previously managed on Zoloft for which she took for several months, did well except for side effects of diarrhea, and came off as she felt better. She has a therapist, has not connected with her therapist in over one year.   She was once managed on Lexapro for which made her "feel bad", didn't feel like herself.   GAD 7 : Generalized Anxiety Score 05/31/2020 05/13/2017  Nervous, Anxious, on Edge 1 3  Control/stop worrying 1 2  Worry too much - different things 1 3  Trouble relaxing 1 3  Restless 0 3  Easily annoyed or irritable 0 2  Afraid - awful might happen 0 1  Total GAD 7 Score 4 17  Anxiety Difficulty - Somewhat difficult      Review of Systems  Musculoskeletal:       Muscle tenseness  Neurological: Positive for numbness.  Psychiatric/Behavioral: The patient is nervous/anxious.        See HPI       Past Medical History:  Diagnosis Date   Asthma    GERD  (gastroesophageal reflux disease)    silent GERD which has caused cough in the past     Social History   Socioeconomic History   Marital status: Significant Other    Spouse name: Not on file   Number of children: 0   Years of education: Bachelors   Highest education level: Not on file  Occupational History   Occupation: [employer]  Tobacco Use   Smoking status: Never Smoker   Smokeless tobacco: Never Used  Building services engineer Use: Never used  Substance and Sexual Activity   Alcohol use: Yes    Alcohol/week: 0.0 standard drinks    Comment: rare   Drug use: No   Sexual activity: Yes    Birth control/protection: Pill  Other Topics Concern   Not on file  Social History Narrative   Works as Regulatory affairs officer at [employer]   Married   No children   Moved to [place] in 2007 from [place]   Enjoys relaxing.    Right handed    Caffeine use: Coffee every day   Tea very rare   Social Determinants of Health   Financial Resource Strain: Not on file  Food Insecurity: Not on file  Transportation Needs: Not on file  Physical Activity: Not on file  Stress: Not on file  Social Connections: Not on file  Intimate Partner Violence: Not on file    Past Surgical  History:  Procedure Laterality Date   TONSILECTOMY/ADENOIDECTOMY WITH MYRINGOTOMY  2012   WISDOM TOOTH EXTRACTION  2010    Family History  Problem Relation Age of Onset   Ulcerative colitis Mother        Living   Cancer Mother    Breast cancer Paternal Grandmother 56       breast cancer with mastectomy, chemo and radiation   Diabetes Paternal Grandmother    Cancer Paternal Grandmother        Breast   Hyperlipidemia Father        Living   Cancer Sister    Breast cancer Maternal Grandmother 58       breast cancer: lumpectomy   Cancer Maternal Grandmother        Breast   Heart disease Maternal Grandfather        MI; dissecting aneurysm   Heart attack Maternal Grandfather     Hypertension Maternal Grandfather    Prostate cancer Paternal Grandfather    Healthy Sister        x2    Allergies  Allergen Reactions   Amoxicillin-Pot Clavulanate Other (See Comments)    Gi upset    Current Outpatient Medications on File Prior to Visit  Medication Sig Dispense Refill   albuterol (PROVENTIL HFA;VENTOLIN HFA) 108 (90 Base) MCG/ACT inhaler Inhale 2 puffs into the lungs every 4 (four) hours as needed for wheezing or shortness of breath. 1 Inhaler 0   Ascorbic Acid (VITAMIN C) 100 MG tablet Take 100 mg by mouth as directed.     Cholecalciferol (VITAMIN D-3) 125 MCG (5000 UT) TABS      Glatiramer Acetate 40 MG/ML SOSY Inject 1 Dose into the skin 3 (three) times a week. Takes every Sunday, Tuesday, Thursday     guaiFENesin (MUCINEX) 600 MG 12 hr tablet Take by mouth 2 (two) times daily.     Multiple Vitamins-Minerals (MULTIVITAMIN GUMMIES WOMENS) CHEW Chew by mouth as directed.     ondansetron (ZOFRAN ODT) 4 MG disintegrating tablet Take 1 tablet (4 mg total) by mouth every 8 (eight) hours as needed for nausea or vomiting. 15 tablet 0   No current facility-administered medications on file prior to visit.    BP 118/80    Pulse 92    Temp 97.8 F (36.6 C) (Temporal)    Ht 5\' 7"  (1.702 m)    Wt 140 lb 8 oz (63.7 kg)    SpO2 99%    BMI 22.01 kg/m    Objective:   Physical Exam Constitutional:      Appearance: She is well-nourished.  Cardiovascular:     Rate and Rhythm: Normal rate and regular rhythm.  Pulmonary:     Effort: Pulmonary effort is normal.     Breath sounds: Normal breath sounds.  Musculoskeletal:     Cervical back: Neck supple.  Skin:    General: Skin is warm and dry.  Psychiatric:        Mood and Affect: Mood and affect normal.     Comments: Tearful at times during visit            Assessment & Plan:

## 2020-05-31 NOTE — Patient Instructions (Signed)
You can try hydroxyzine 10 mg as needed for anxiety.  Take 1 or 2 tablets by mouth no more than twice daily as needed.  This medication may cause drowsiness.  Please do update me if your symptoms become more frequent as discussed.  It was a pleasure to see you today!

## 2020-05-31 NOTE — Assessment & Plan Note (Signed)
Chronic, waxes and wanes. Returned as of several months ago, she endorses intermittent symptoms.  She does not wish to take something daily at this time given her inability to tolerate Lexapro and Zoloft in the past.  We discussed that as needed medication was okay, unless she was experiencing daily symptoms.  Prescription for hydroxyzine 10 mg sent to pharmacy to use as needed for anxiety.  Discussed that she can take 1 to 2 tablets twice daily as needed.  Drowsiness precautions provided.  She will update if symptoms become more frequent, and at that point I would recommend venlafaxine ER 37.5 mg daily.  She will update.

## 2020-07-19 ENCOUNTER — Ambulatory Visit: Payer: Managed Care, Other (non HMO) | Admitting: Neurology

## 2020-08-15 ENCOUNTER — Ambulatory Visit: Payer: Managed Care, Other (non HMO) | Admitting: Neurology

## 2020-08-15 ENCOUNTER — Telehealth: Payer: Self-pay | Admitting: Neurology

## 2020-08-15 ENCOUNTER — Encounter: Payer: Self-pay | Admitting: Neurology

## 2020-08-15 VITALS — BP 158/84 | HR 94 | Ht 67.0 in | Wt 142.0 lb

## 2020-08-15 DIAGNOSIS — G35 Multiple sclerosis: Secondary | ICD-10-CM

## 2020-08-15 DIAGNOSIS — M533 Sacrococcygeal disorders, not elsewhere classified: Secondary | ICD-10-CM

## 2020-08-15 DIAGNOSIS — R200 Anesthesia of skin: Secondary | ICD-10-CM

## 2020-08-15 MED ORDER — METHYLPREDNISOLONE 4 MG PO TBPK: ORAL_TABLET | ORAL | 0 refills | Status: DC

## 2020-08-15 NOTE — Telephone Encounter (Signed)
cigna order sent to GI. They will obtain the auth and reach out to the patient to schedule.  °

## 2020-08-15 NOTE — Progress Notes (Addendum)
[facility]  PATIENT: Penny Hansen DOB: [birth year]  REFERRING DOCTOR OR PCP: Vernona Rieger, NP SOURCE: Patient, notes from PCP, MRI images personally reviewed.  _________________________________   HISTORICAL  CHIEF COMPLAINT:  Chief Complaint  Patient presents with  . Follow-up    RM 12, alone. Last seen 04/19/2020. On glatiramer for MS. Having severe left SI joint pain that started 5 weeks ago. Unable to flex at waist. Last week, pinky toe went to sleep for a second while standing. Tried heat/ice, advil, naproxen. Having right knee pain.     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    Update 08/15/2020:  Since last visit, she started glatiramer.  She is tolerating it well.  She has not had any definite MS exacerbations.  However, she is experiencing severe pain in the left buttock starting about 5 weeks ago.  Pain increases when she flexes.    She is having left SI joint region pain.   Pain is worse when she bends over and tying shoes is difficult.   She is having patients moving patients at work.   Pain is going into the hip when int   MS history: In June 2020, she had an MRI of the brain as part of a "dummy run" for an MS study.  It was abnormal showing foci consistent with MS.  In retrospect, back in 2014, she had some tingling in the right flank that lasted about 3 days.   She was working long shifts so assumed it was a muscle strain.  She also has had some sensory symptoms in the right arm.  She had additional MRI of the spine 10/11/2018 that showed a normal spinal cord.  Repeat MRI of the brain was performed 05/05/2019 and showed no new lesions.  Repeat MRI of the brain 03/28/2020 showed 2 new foci, one of the periventricular splenium of the corpus callosum and another in the right frontal lobe.  She then had a lumbar puncture 04/12/2020.  It was abnormal showing oligoclonal bands and elevated IgG index. She started  glatiramer.    Imaging and other studies: MRI 09/13/2018 showed T2/FLAIR hyperintense foci in the hemispheres including the periventricular and juxtacortical white matter.  None of the foci look to be acute.  MRI brain 10/11/2018 was unchanged.  Normal enhancement pattern.  MRI of the cervical and thoracic spine 10/11/2018 showed a normal spinal cord.  MRI of the brain 05/05/2019 showed no new lesions  MRI of the brain 03/28/2020 showed 2 new foci, 1 in the periventricular splenium and another in the right frontal lobe.  These do not enhance.  Echocardiogram with bubbles 09/30/2018 showed some bubbles crossing late (not c/w ASD/PFO but could be pulmonary AVMs.   Follow up transcranial dopplers with agitated saline showed no bubbles.     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   REVIEW OF SYSTEMS: Constitutional: No fevers, chills, sweats, or change in appetite Eyes: No visual changes, double vision, eye pain Ear, nose and throat: No hearing loss, ear pain, nasal congestion, sore throat Cardiovascular: No chest pain, palpitations Respiratory: No shortness of breath at rest or with exertion.   No wheezes GastrointestinaI: No nausea, vomiting, diarrhea, abdominal pain, fecal incontinence Genitourinary: No dysuria, urinary retention or frequency.  No nocturia. Musculoskeletal: No neck pain. SI pain on left Integumentary: No rash, pruritus, skin lesions  Neurological: as above Psychiatric: No depression at this time.  No anxiety Endocrine: No palpitations, diaphoresis, change in appetite, change in weigh or increased thirst Hematologic/Lymphatic: No anemia, purpura, petechiae. Allergic/Immunologic: No itchy/runny eyes, nasal congestion, recent allergic reactions, rashes  ALLERGIES: Allergies  Allergen Reactions  . Amoxicillin-Pot Clavulanate Other (See Comments)    Gi upset    HOME  MEDICATIONS:  Current Outpatient Medications:  .  albuterol (PROVENTIL HFA;VENTOLIN HFA) 108 (90 Base) MCG/ACT inhaler, Inhale 2 puffs into the lungs every 4 (four) hours as needed for wheezing or shortness of breath., Disp: 1 Inhaler, Rfl: 0 .  Ascorbic Acid (VITAMIN C) 100 MG tablet, Take 100 mg by mouth as directed., Disp: , Rfl:  .  Cholecalciferol (VITAMIN D-3) 125 MCG (5000 UT) TABS, , Disp: , Rfl:  .  Glatiramer Acetate 40 MG/ML SOSY, Inject 1 Dose into the skin 3 (three) times a week. Takes every Sunday, Tuesday, Thursday, Disp: , Rfl:  .  guaiFENesin (MUCINEX) 600 MG 12 hr tablet, Take by mouth 2 (two) times daily., Disp: , Rfl:  .  hydrOXYzine (ATARAX/VISTARIL) 10 MG tablet, Take 1-2 tablets (10-20 mg total) by mouth 2 (two) times daily as needed for anxiety., Disp: 30 tablet, Rfl: 0 .  methylPREDNISolone (MEDROL DOSEPAK) 4 MG TBPK tablet, 6 pills po x 1 day, then 5 pills po x 1 day, then 4 pills po x 1 day, then 3 pills po x 1 day, then 2 pills po x 1 day, then 1 pill po x 1 day, Disp: 21 tablet, Rfl: 0 .  Multiple Vitamins-Minerals (MULTIVITAMIN GUMMIES WOMENS) CHEW, Chew by mouth as directed., Disp: , Rfl:  .  ondansetron (ZOFRAN ODT) 4 MG disintegrating tablet, Take 1 tablet (4 mg total) by mouth every 8 (eight) hours as needed for nausea or vomiting., Disp: 15 tablet, Rfl: 0  PAST MEDICAL HISTORY: Past Medical History:  Diagnosis Date  . Asthma   . GERD (gastroesophageal reflux disease)    silent GERD which has caused cough in the past    PAST SURGICAL HISTORY: Past Surgical History:  Procedure Laterality Date  . TONSILECTOMY/ADENOIDECTOMY WITH MYRINGOTOMY  2012  . WISDOM TOOTH EXTRACTION  2010    FAMILY HISTORY: Family History  Problem Relation Age of Onset  . Ulcerative colitis Mother        Living  . Cancer Mother   . Breast cancer Paternal Grandmother 57       breast cancer with mastectomy, chemo and radiation  . Diabetes Paternal Grandmother   . Cancer  Paternal Grandmother        Breast  . Hyperlipidemia Father        Living  . Cancer Sister   . Breast cancer Maternal Grandmother 84       breast cancer: lumpectomy  . Cancer Maternal Grandmother        Breast  . Heart disease Maternal Grandfather        MI; dissecting aneurysm  . Heart attack Maternal Grandfather   . Hypertension Maternal Grandfather   . Prostate cancer Paternal Grandfather   . Healthy Sister        x2    SOCIAL HISTORY:  Social History   Socioeconomic History  . Marital status: Significant Other    Spouse name: Not on file  . Number of children: 0  . Years of education: Bachelors  . Highest education level: Not on file  Occupational History  . Occupation: [employer]  Tobacco Use  .  Smoking status: Never Smoker  . Smokeless tobacco: Never Used  Vaping Use  . Vaping Use: Never used  Substance and Sexual Activity  . Alcohol use: Yes    Alcohol/week: 0.0 standard drinks    Comment: rare  . Drug use: No  . Sexual activity: Yes    Birth control/protection: Pill  Other Topics Concern  . 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    Right handed    Caffeine use: Coffee every day   Tea very rare   Social Determinants of Health   Financial Resource Strain: Not on file  Food Insecurity: Not on file  Transportation Needs: Not on file  Physical Activity: Not on file  Stress: Not on file  Social Connections: Not on file  Intimate Partner Violence: Not on file     PHYSICAL EXAM  Vitals:   08/15/20 0840  BP: (!) 158/84  Pulse: 94  Weight: 142 lb (64.4 kg)  Height: 5\' 7"  (1.702 m)    Body mass index is 22.24 kg/m.   General: The patient is well-developed and well-nourished and in no acute distress.  She has tenderness over the left SI joint region.  There is no tenderness over the piriformis muscle  or the trochanteric bursa or lumbar paraspinals.  Neurologic Exam  Mental status: The patient is alert and oriented x 3 at the time of the examination. The patient has apparent normal recent and remote memory, with an apparently normal attention span and concentration ability.   Speech is normal.  Cranial nerves: Extraocular movements are full.There is good facial sensation to soft touch bilaterally.Facial strength is normal.  Trapezius and sternocleidomastoid strength is normal. No dysarthria is noted. No obvious hearing deficits are noted.  Motor:  Muscle bulk is normal.   Muscle tone is normal.  Strength is normal in the arms and legs.  Sensory: Sensory testing is intact to pinprick, soft touch and vibration sensation in all 4 extremities.  Coordination: Cerebellar testing reveals good finger-nose-finger and heel-to-shin bilaterally.  Gait and station: Station is normal.   Gait and tandem gait are normal.  Romberg is negative.  Reflexes: Deep tendon reflexes are symmetric and normal bilaterally.   Plantar responses are flexor.      ASSESSMENT AND PLAN  1. Relapsing remitting multiple sclerosis (HCC)   2. Numbness   3. SI (sacroiliac) pain      1.  Continue Copaxone.  She is having some side effects with skin reactions and if these worsen we would need to consider a different DMT.   2.   I sent in a steroid pack for the SI joint region pain.  If she is not better in a couple days then we will consider referral for SI joint injection. 3.   Return to see me in 6 months or sooner for new or worsening neurologic symptoms.    Julizza Sassone A. , MD, [facility] 08/15/2020, 5:02 PM Certified in Neurology, Clinical Neurophysiology, Sleep Medicine and Neuroimaging  [facility] [address], [address] [place], [place] [phone]

## 2020-08-16 NOTE — Telephone Encounter (Signed)
Rutherford Nail: [id number] (exp. 08/16/20 to 11/14/20)

## 2020-08-17 ENCOUNTER — Ambulatory Visit: Payer: Managed Care, Other (non HMO) | Admitting: Primary Care

## 2020-09-04 DIAGNOSIS — Z9189 Other specified personal risk factors, not elsewhere classified: Secondary | ICD-10-CM

## 2020-09-04 DIAGNOSIS — Z803 Family history of malignant neoplasm of breast: Secondary | ICD-10-CM

## 2020-09-12 NOTE — Telephone Encounter (Signed)
Called and scheduled peer to peer for this case with insurance-provider that will call Jae Dire is Benjamine Mola. She will call our office at 1:45 pm to complete this process.  Back line and my office number was given to call. Jae Dire, here is some of the information that they may ask you (also please have her medical records available for review.  CPT [id number] Reference number for the case that was denied is [id number] Member ID: [id number].

## 2020-09-12 NOTE — Telephone Encounter (Signed)
See response from patient. I will work on the appeal based on the additional information from patient.

## 2020-09-12 NOTE — Telephone Encounter (Signed)
I just received fax from your insurance stating that they have denied breast MRI scan. The reason per insurance stated " It is only supported if you are 10 years younger than the age of a family member-mother, sister- when their cancer was found, or 33 years old (whichever comes first).  I sent patient mychart message about this.

## 2020-09-13 NOTE — Telephone Encounter (Signed)
Approval number [id number].  See my chart message.

## 2020-09-27 ENCOUNTER — Ambulatory Visit
Admission: RE | Admit: 2020-09-27 | Discharge: 2020-09-27 | Disposition: A | Discharge status: Home / Self Care | Payer: Managed Care, Other (non HMO) | Source: Ambulatory Visit | Attending: Primary Care | Admitting: Primary Care

## 2020-09-27 ENCOUNTER — Other Ambulatory Visit: Payer: Self-pay

## 2020-09-27 DIAGNOSIS — Z803 Family history of malignant neoplasm of breast: Secondary | ICD-10-CM

## 2020-09-27 DIAGNOSIS — Z9189 Other specified personal risk factors, not elsewhere classified: Secondary | ICD-10-CM

## 2020-09-27 MED ORDER — GADOBUTROL 1 MMOL/ML IV SOLN
7.0000 mL | Freq: Once | INTRAVENOUS | Status: AC | PRN
  Administered 2020-09-27: 7 mL via INTRAVENOUS

## 2020-09-28 ENCOUNTER — Other Ambulatory Visit: Payer: Self-pay | Admitting: Primary Care

## 2020-09-28 DIAGNOSIS — R928 Other abnormal and inconclusive findings on diagnostic imaging of breast: Secondary | ICD-10-CM

## 2020-10-11 ENCOUNTER — Other Ambulatory Visit: Payer: Self-pay

## 2020-10-11 ENCOUNTER — Other Ambulatory Visit (HOSPITAL_COMMUNITY): Payer: Self-pay | Admitting: Diagnostic Radiology

## 2020-10-11 ENCOUNTER — Ambulatory Visit
Admission: RE | Admit: 2020-10-11 | Discharge: 2020-10-11 | Disposition: A | Discharge status: Home / Self Care | Payer: Managed Care, Other (non HMO) | Source: Ambulatory Visit | Attending: Primary Care | Admitting: Primary Care

## 2020-10-11 DIAGNOSIS — R928 Other abnormal and inconclusive findings on diagnostic imaging of breast: Secondary | ICD-10-CM

## 2020-10-11 IMAGING — MR MR BREAST BX W/ LOC DEV EA ADD LESION IMAGE BX SPEC MR GUIDE*R*
7 of 10 series · 31 of 48 positions shown · IV contrast (7 ml gadavist)
Comparison: Previous exams.
COMPARISON: Previous exams.

Addendum:
CLINICAL DATA: Patient presents for MR guided core biopsy of 2
areas in the RIGHT breast.

EXAM:
MRI GUIDED CORE NEEDLE BIOPSY OF THE RIGHT BREAST x2
TECHNIQUE: Multiplanar, multisequence MR imaging of the RIGHT breast was
performed both before and after administration of intravenous
contrast.
CONTRAST:  7 ml of Gadavist

[Series 2: fiducial unilateral · sagittal · 2.0mm · 1.33mm/px · 3 of 64 slices shown]
[im 1/64]
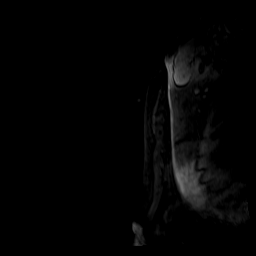
[im 32/64]
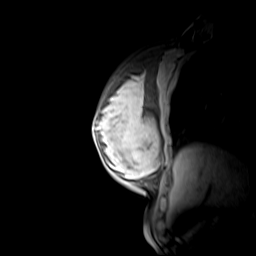
[im 64/64]
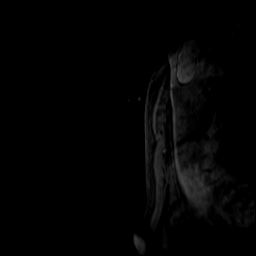

[Series 3: dynamic pre · axial · non-contrast · 1.3mm · 0.73mm/px · z∈[-72,+114]mm · 5 of 144 slices shown]
[im 1/144]
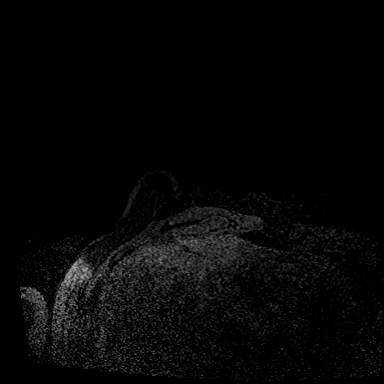
[im 36/144]
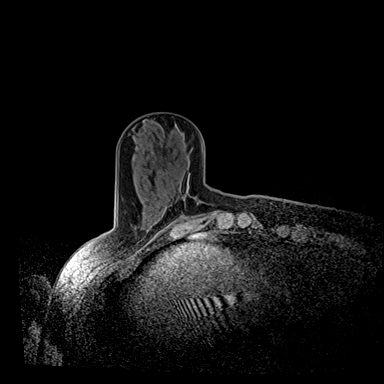
[im 72/144]
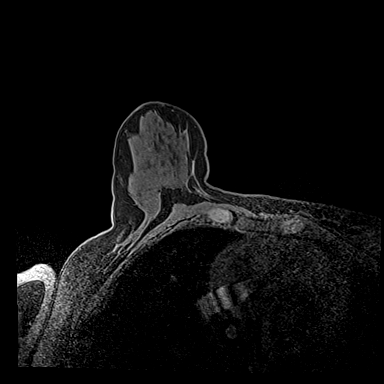
[im 108/144]
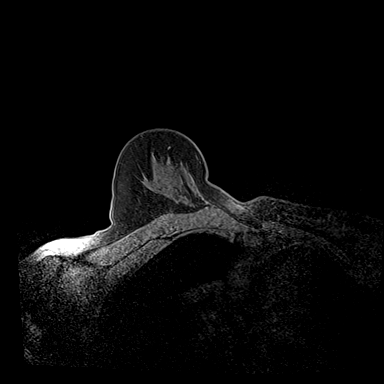
[im 144/144]
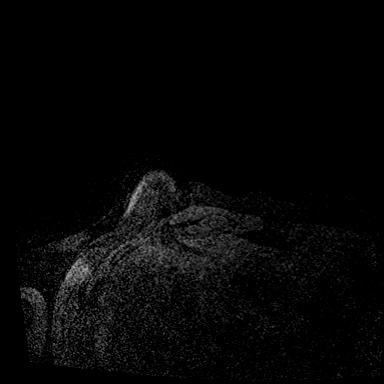

[Series 4: dynamic post 20 · axial · 1.3mm · 0.73mm/px · z∈[-72,+114]mm · 5 of 144 slices shown (1 of 2)]
[im 1/144]
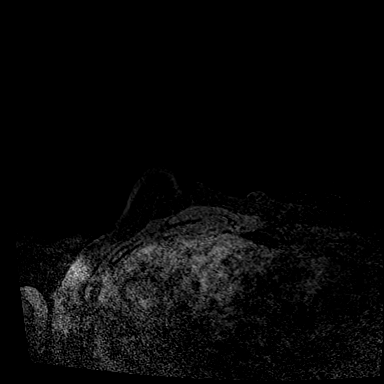
[im 36/144]
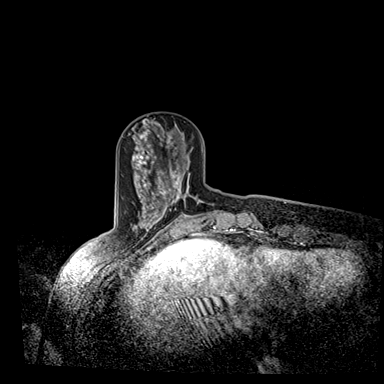
[im 72/144]
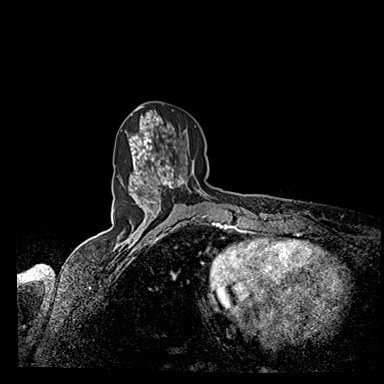
[im 108/144]
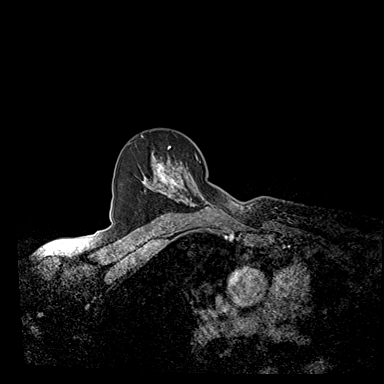
[im 144/144]
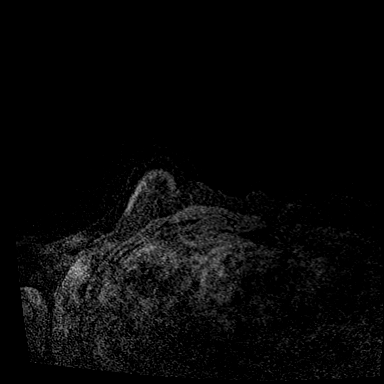

[Series 5: dynamic post 20 · axial · 1.3mm · 0.73mm/px · z∈[-72,+114]mm · 5 of 144 slices shown (2 of 2)]
[im 1/144]
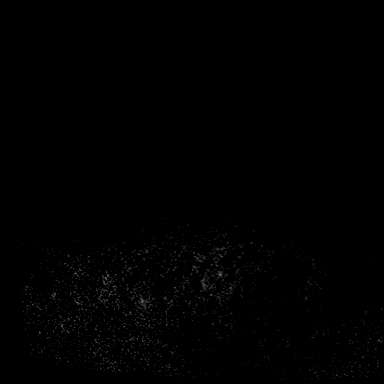
[im 36/144]
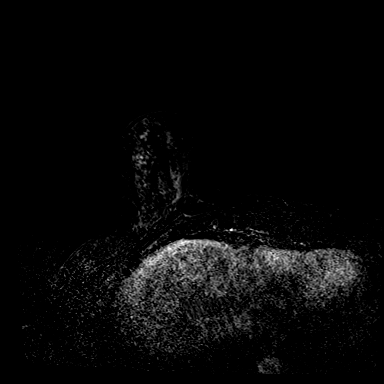
[im 72/144]
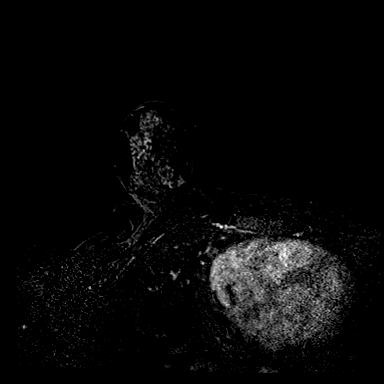
[im 108/144]
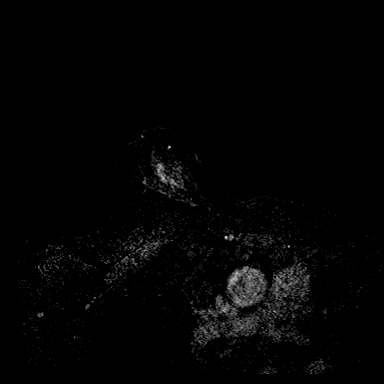
[im 144/144]
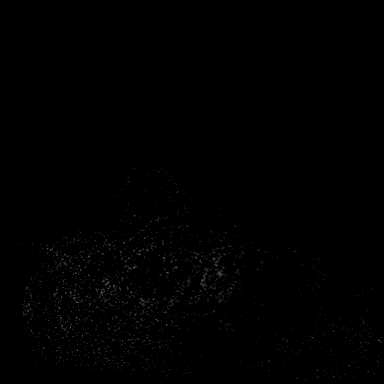

[Series 6: dynamic post 3 · axial · 1.3mm · 0.73mm/px · z∈[-72,+114]mm · 5 of 144 slices shown (1 of 2)]
[im 1/144]
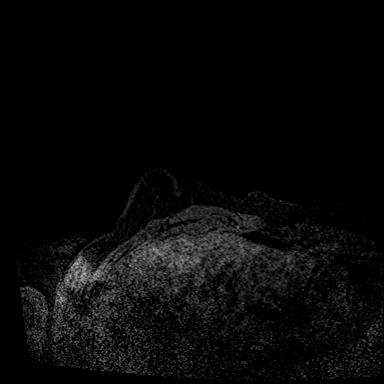
[im 36/144]
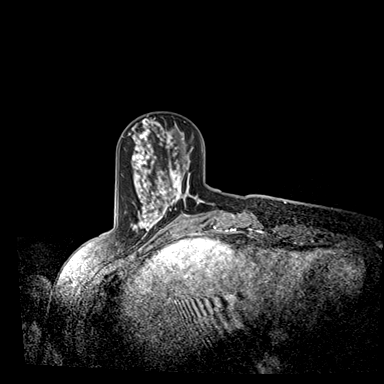
[im 72/144]
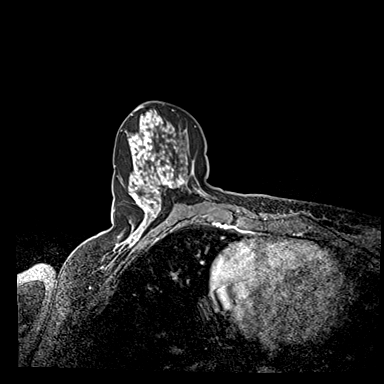
[im 108/144]
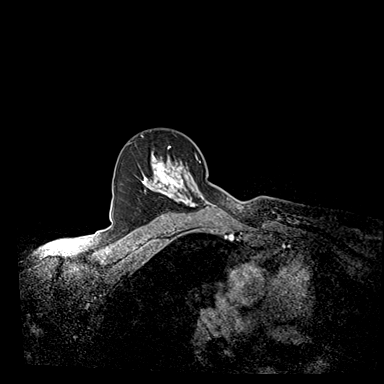
[im 144/144]
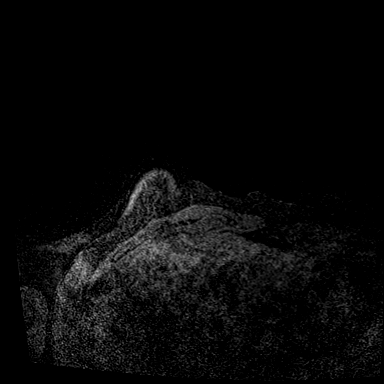

[Series 7: dynamic post 3 · axial · 1.3mm · 0.73mm/px · z∈[-72,+114]mm · 5 of 144 slices shown (2 of 2)]
[im 1/144]
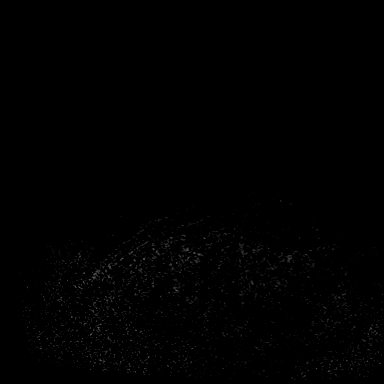
[im 36/144]
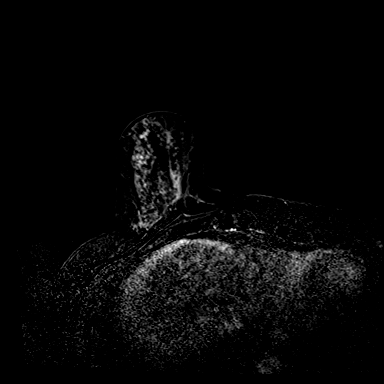
[im 72/144]
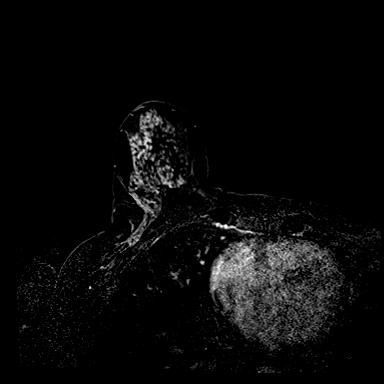
[im 108/144]
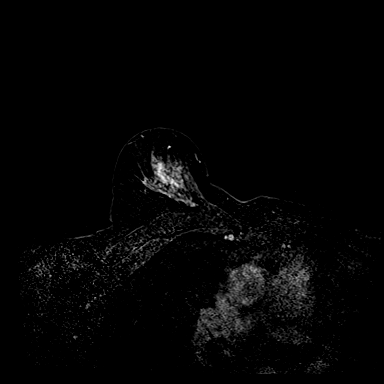
[im 144/144]
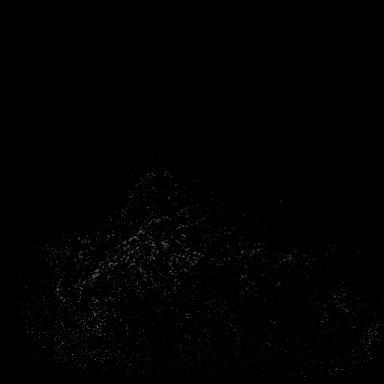

[Series 8: needle confirmation · axial · 1.3mm · 0.73mm/px · z∈[-72,+20]mm · 3 of 144 slices shown]
[im 1/144]
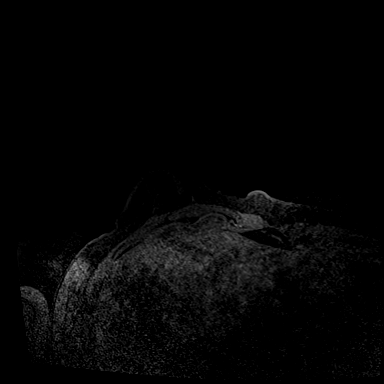
[im 36/144]
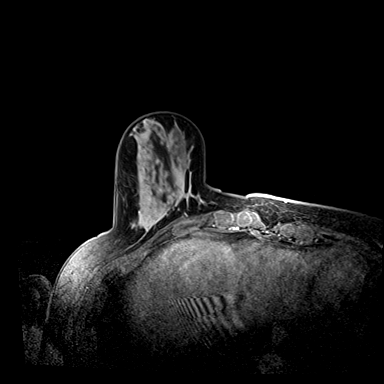
[im 72/144]
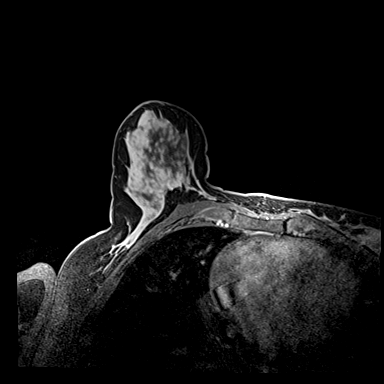

[31 of 48 positions shown; findings below may reference images not displayed]

FINDINGS: I met with the patient, and we discussed the procedure of MRI guided
biopsy, including risks, benefits, and alternatives. Specifically,
we discussed the risks of infection, bleeding, tissue injury, clip
migration, and inadequate sampling. Informed, written consent was
given. The usual time out protocol was performed immediately prior
to the procedure.

Site 1: Lesion quadrant: LOWER OUTER QUADRANT RIGHT breast, cylinder
clip

Using sterile technique and 1% lidocaine as local anesthetic, under
MRI guidance, a 9 gauge vacuum assisted device was used to perform
core needle biopsy of small mass in the LOWER OUTER QUADRANT of the
RIGHT breast using a 9 LATERAL to be approach.

At the conclusion of the procedure, cylinder tissue marker clip was
deployed into the biopsy cavity.

Site 2: Lesion quadrant: LOWER INNER QUADRANT RIGHT breast, barbell
clip

Using sterile technique and 1% lidocaine as local anesthetic, under
MRI guidance, a 9 gauge vacuum assisted device was used to perform
core needle biopsy of non mass enhancement in the LOWER INNER
QUADRANT of the RIGHT breast using a MEDIAL approach.

At the conclusion of the procedure, barbell shaped tissue marker
clip was deployed into the biopsy cavity.

Follow-up RIGHT 2-view mammogram was performed and dictated
separately.
IMPRESSION: MRI guided biopsy of 2 sites of concern in the RIGHT breast. No
apparent complications.

ADDENDUM:
Pathology revealed FIBROCYSTIC CHANGES INCLUDING APOCRINE
METAPLASIA- NO MALIGNANCY IDENTIFIED of the RIGHT breast, lower
outer quadrant, cylinder clip. This was found to be concordant by
Dr. TIGER.

Pathology revealed FIBROCYSTIC CHANGES INCLUDING APOCRINE
METAPLASIA- NO MALIGNANCY IDENTIFIED of the RIGHT breast, lower
inner quadrant, barbell clip. This was found to be concordant by Dr.
TIGER.

Pathology results were discussed with the patient by telephone. The
patient reported doing well after the biopsies with tenderness at
the sites. Post biopsy instructions and care were reviewed and
questions were answered. The patient was encouraged to call The

Bilateral breast MRI recommended in 6 months per protocol.

Pathology results reported by TIGER RN on [DATE].

*** End of Addendum ***
FINDINGS: I met with the patient, and we discussed the procedure of MRI guided
biopsy, including risks, benefits, and alternatives. Specifically,
we discussed the risks of infection, bleeding, tissue injury, clip
migration, and inadequate sampling. Informed, written consent was
given. The usual time out protocol was performed immediately prior
to the procedure.

Site 1: Lesion quadrant: LOWER OUTER QUADRANT RIGHT breast, cylinder
clip

Using sterile technique and 1% lidocaine as local anesthetic, under
MRI guidance, a 9 gauge vacuum assisted device was used to perform
core needle biopsy of small mass in the LOWER OUTER QUADRANT of the
RIGHT breast using a 9 LATERAL to be approach.

At the conclusion of the procedure, cylinder tissue marker clip was
deployed into the biopsy cavity.

Site 2: Lesion quadrant: LOWER INNER QUADRANT RIGHT breast, barbell
clip

Using sterile technique and 1% lidocaine as local anesthetic, under
MRI guidance, a 9 gauge vacuum assisted device was used to perform
core needle biopsy of non mass enhancement in the LOWER INNER
QUADRANT of the RIGHT breast using a MEDIAL approach.

At the conclusion of the procedure, barbell shaped tissue marker
clip was deployed into the biopsy cavity.

Follow-up RIGHT 2-view mammogram was performed and dictated
separately.
IMPRESSION: MRI guided biopsy of 2 sites of concern in the RIGHT breast. No
apparent complications.

## 2020-10-11 IMAGING — MG MM BREAST LOCALIZATION CLIP
4 series · 4 of 12 positions shown · non-contrast
Comparison: Previous exam(s).

CLINICAL DATA: Status post MR guided core biopsy of 2 sites in the
RIGHT breast.

EXAM:
3D DIAGNOSTIC RIGHT MAMMOGRAM POST MRI BIOPSY x2

[R ML synth-2D]
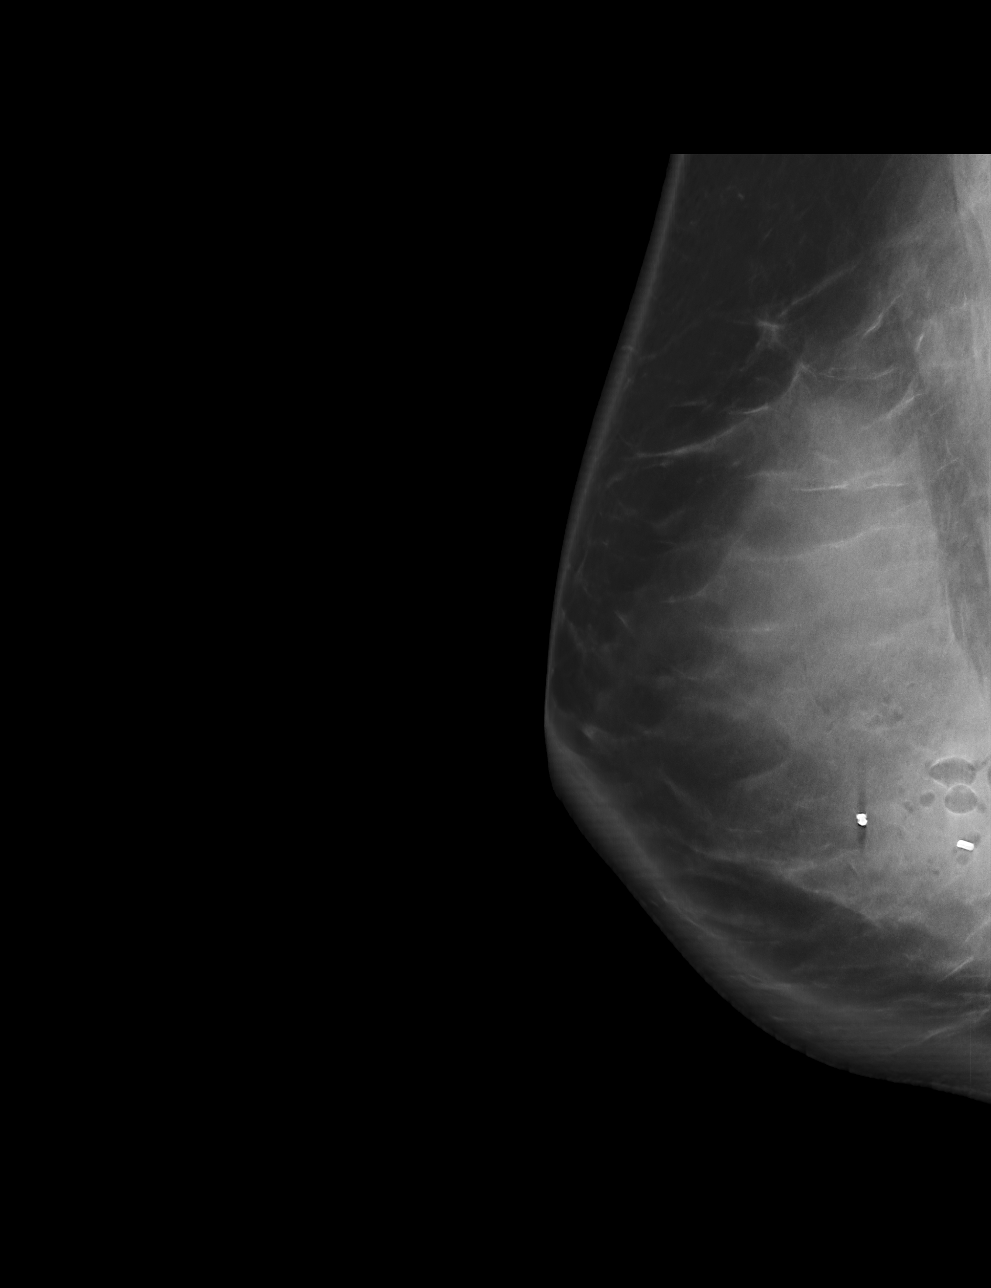

[R CC synth-2D]
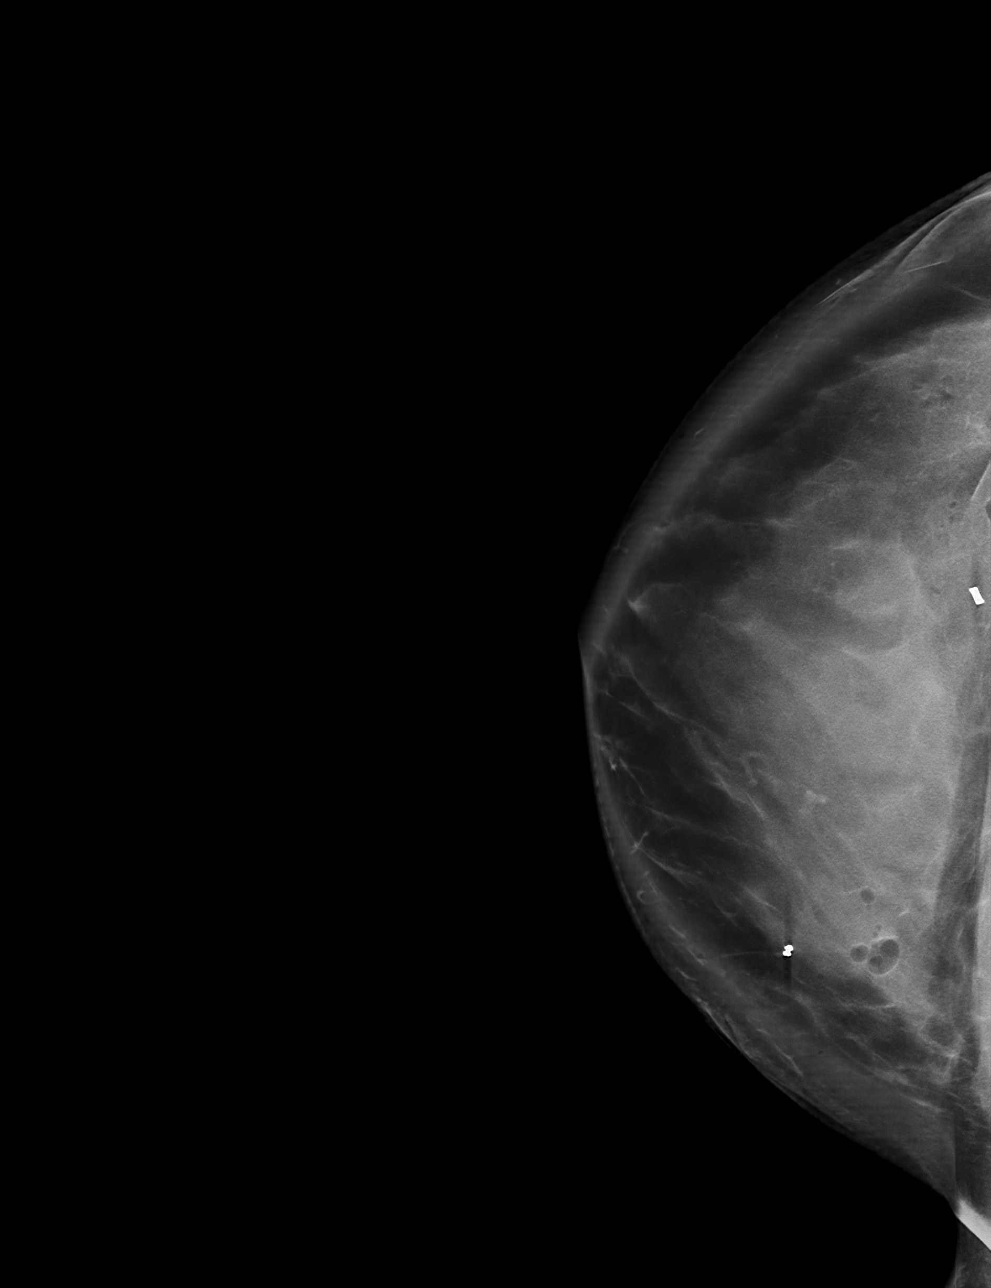

[R ML tomo · tomo slice 57/112.0]
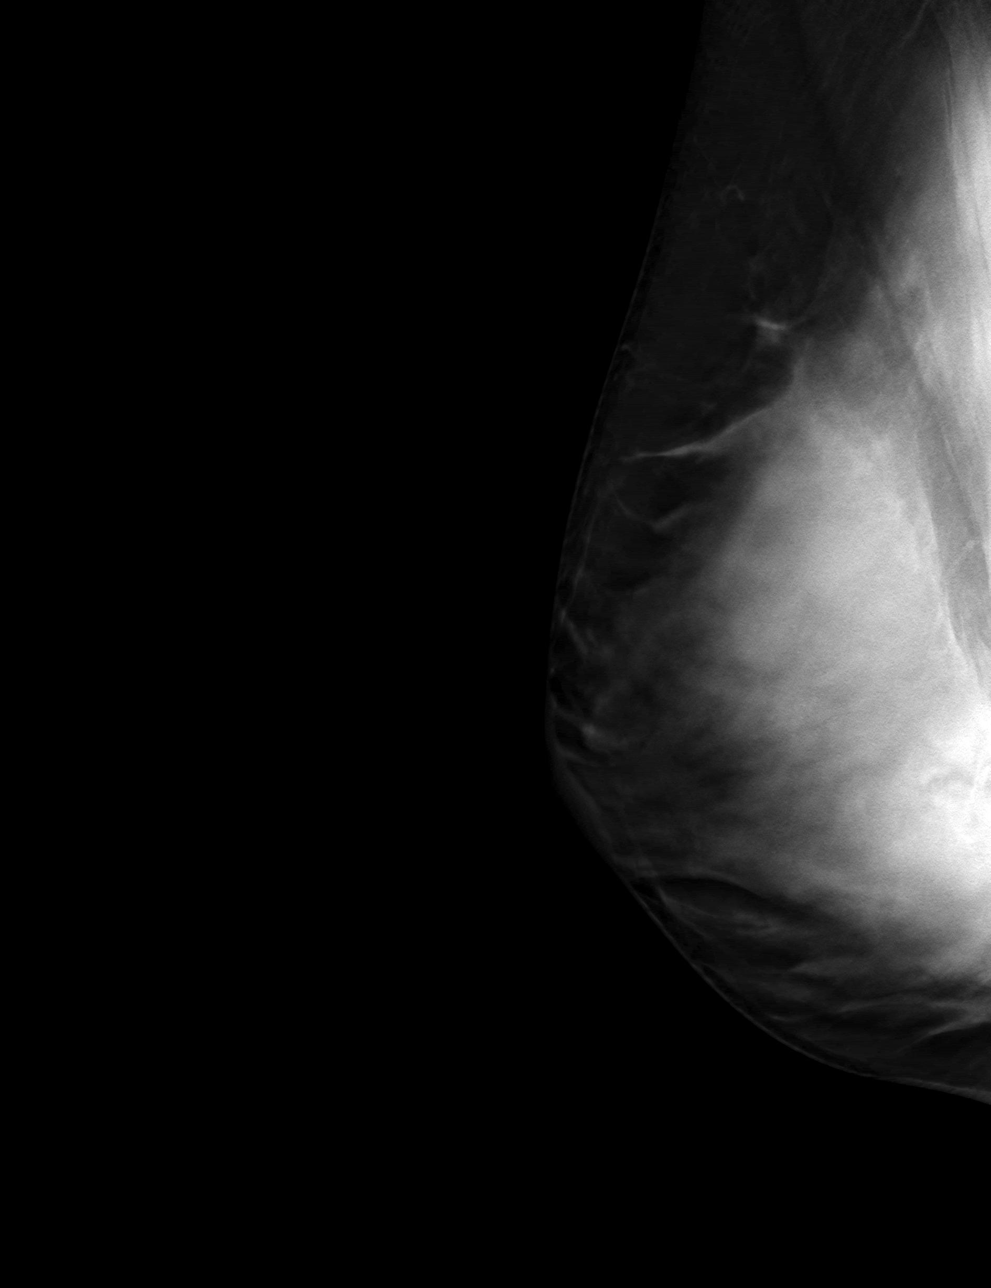

[R CC tomo · tomo slice 57/113.0]
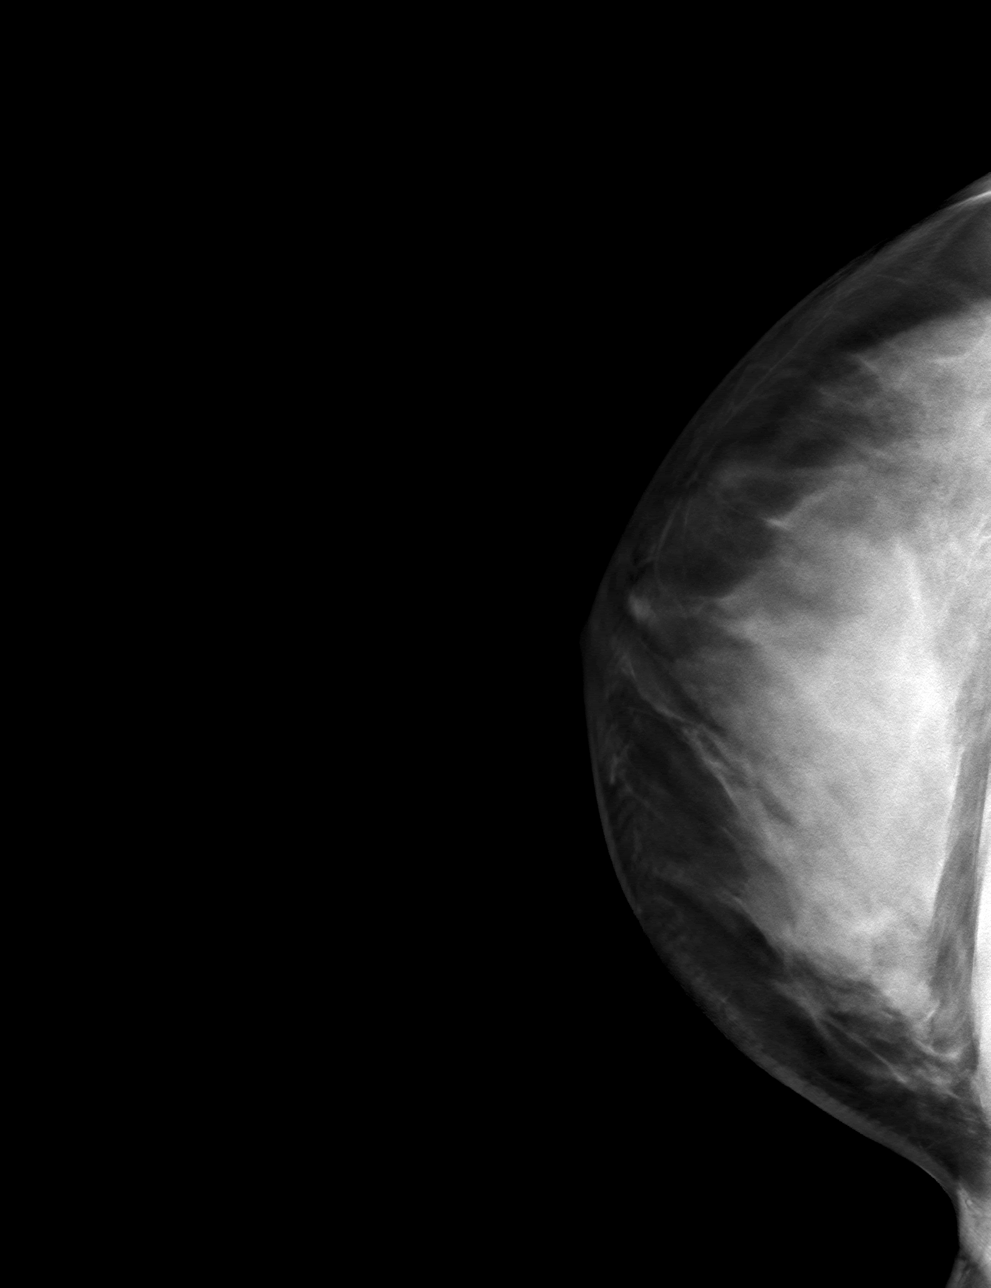

[4 of 12 positions shown; findings below may reference images not displayed]

FINDINGS: 3D Mammographic images were obtained following MRI guided biopsy of
a small enhancing mass in the LOWER OUTER QUADRANT of the RIGHT
breast and placement of a cylinder-shaped clip. The biopsy marking
clip is in expected position at the site of biopsy.

Following biopsy of non mass enhancement in the LOWER INNER QUADRANT
of the RIGHT breast, a barbell shaped clip was placed and is
identified in expected location.
IMPRESSION: Tissue marker clips are in the expected locations after biopsy.

Final Assessment: Post Procedure Mammograms for Marker Placement

## 2020-10-11 MED ORDER — GADOBUTROL 1 MMOL/ML IV SOLN
7.0000 mL | Freq: Once | INTRAVENOUS | Status: AC | PRN
  Administered 2020-10-11: 7 mL via INTRAVENOUS

## 2020-10-14 ENCOUNTER — Ambulatory Visit
Admission: RE | Admit: 2020-10-14 | Discharge: 2020-10-14 | Disposition: A | Discharge status: Home / Self Care | Payer: Managed Care, Other (non HMO) | Source: Ambulatory Visit | Attending: Neurology | Admitting: Neurology

## 2020-10-14 ENCOUNTER — Other Ambulatory Visit: Payer: Self-pay

## 2020-10-14 DIAGNOSIS — G35 Multiple sclerosis: Secondary | ICD-10-CM

## 2020-10-14 IMAGING — MR MR HEAD WO/W CM
14 series · 48 of 48 positions shown · IV contrast (gadavist)
Comparison: MRI of the head without and with contrast [DATE]
and [DATE]
COMPARISON: MRI of the head without and with contrast [DATE]
and [DATE]

Addendum:
CLINICAL DATA: Multiple sclerosis.  No new symptoms.

EXAM:
MRI HEAD WITHOUT AND WITH CONTRAST
TECHNIQUE: Multiplanar, multiecho pulse sequences of the brain and surrounding
structures were obtained without and with intravenous contrast.
CONTRAST:  7mL GADAVIST GADOBUTROL 1 MMOL/ML IV SOLN

[Series 6: DWI · axial · 3.0mm · 0.98mm/px · z∈[-55,+99]mm · 9 of 176 slices shown (1 of 3)]
[im 1/176]
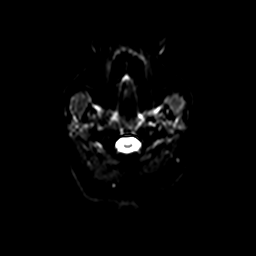
[im 22/176]
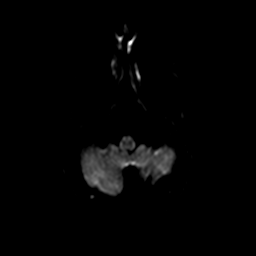
[im 44/176]
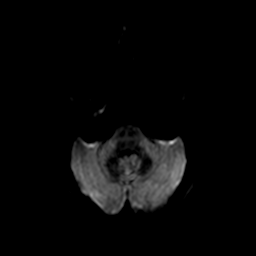
[im 66/176]
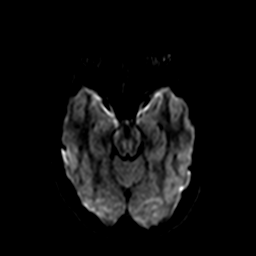
[im 88/176]
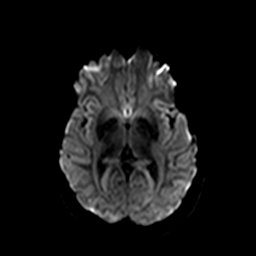
[im 110/176]
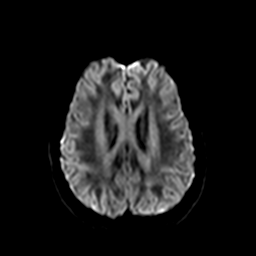
[im 132/176]
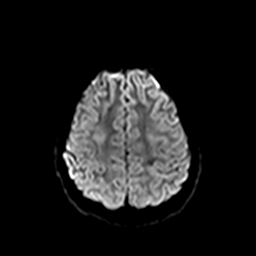
[im 154/176]
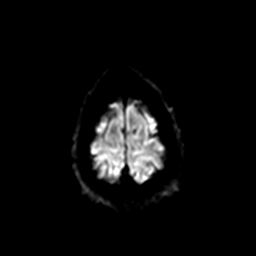
[im 176/176]
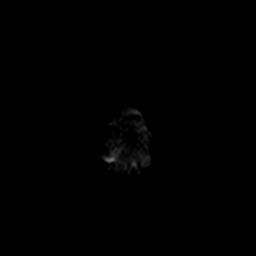

[Series 7: ax dwi_tracew · axial · 3.0mm · 0.98mm/px · z∈[-55,+99]mm · 5 of 88 slices shown]
[im 1/88]
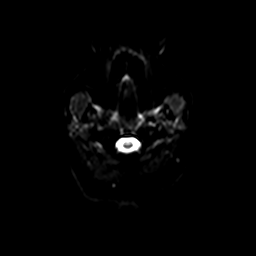
[im 22/88]
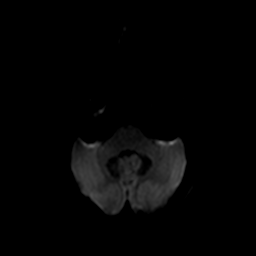
[im 44/88]
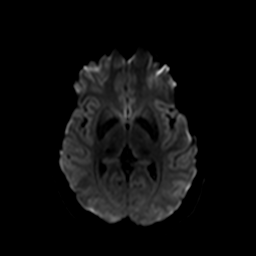
[im 66/88]
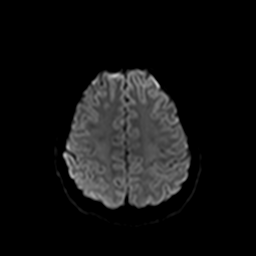
[im 88/88]
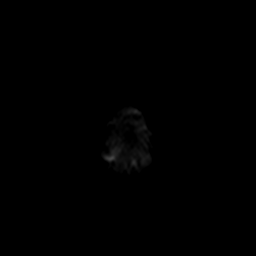

[Series 8: ax dwi_adc · axial · 3.0mm · 0.98mm/px · z∈[-55,+99]mm · 2 of 44 slices shown]
[im 1/44]
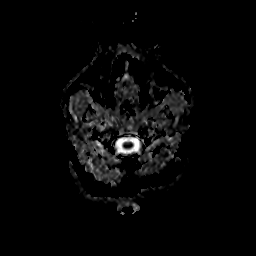
[im 44/44]
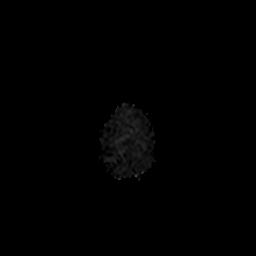

[Series 13: DWI · coronal · 5.0mm · 1.44mm/px · 3 of 68 slices shown (2 of 3)]
[im 1/68]
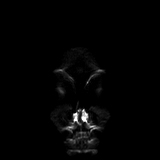
[im 34/68]
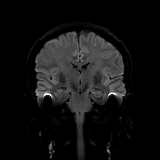
[im 68/68]
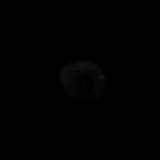

[Series 14: DWI · coronal · 5.0mm · 1.44mm/px · 2 of 34 slices shown (3 of 3)]
[im 1/34]
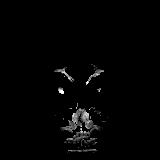
[im 34/34]
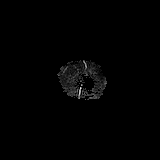

[Series 15: T1 · sagittal · 4.0mm · 0.75mm/px · 1 of 32 slices shown (1 of 3)]
[im 1/32]
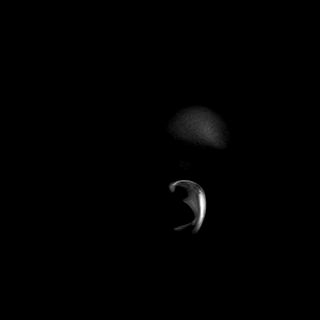

[Series 16: T2 · axial · 4.0mm · 0.39mm/px · 1 of 32 slices shown]
[im 1/32]
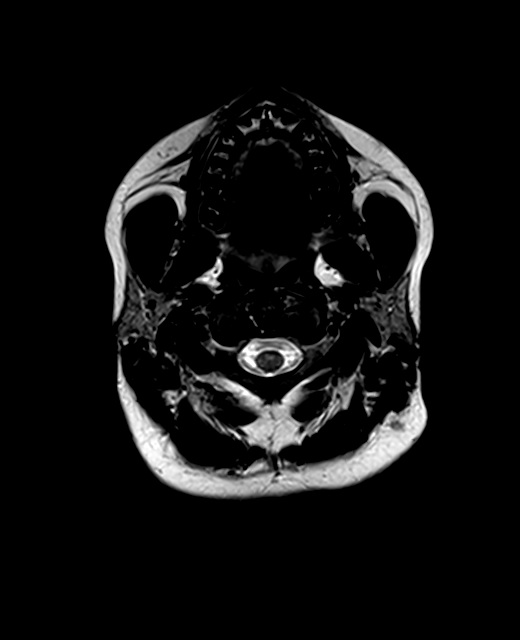

[Series 17: FLAIR · axial · 3.0mm · 0.75mm/px · 1 of 27 slices shown (1 of 2)]
[im 1/27]
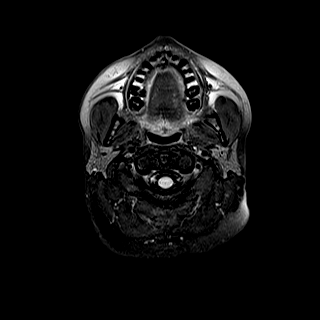

[Series 18: swi_images · axial · 1.5mm · 0.94mm/px · z∈[-63,+91]mm · 5 of 104 slices shown]
[im 1/104]
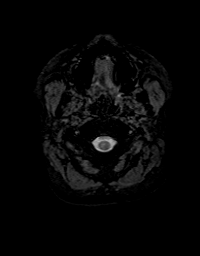
[im 26/104]
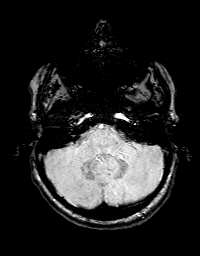
[im 52/104]
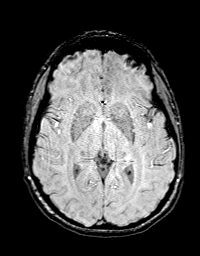
[im 78/104]
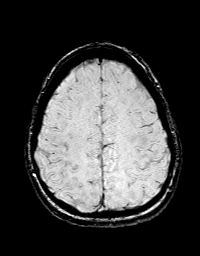
[im 104/104]
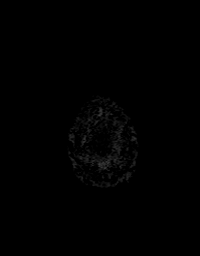

[Series 20: T1 · axial · 1.0mm · 0.94mm/px · z∈[-65,+94]mm · 7 of 160 slices shown (2 of 3)]
[im 1/160]
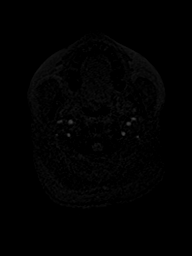
[im 27/160]
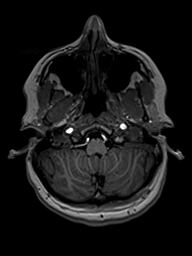
[im 54/160]
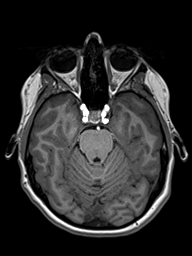
[im 80/160]
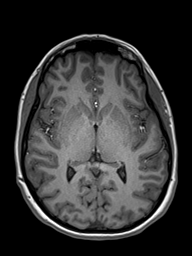
[im 107/160]
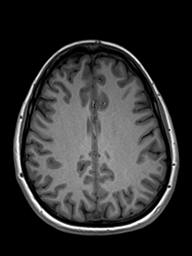
[im 133/160]
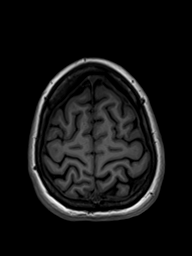
[im 160/160]
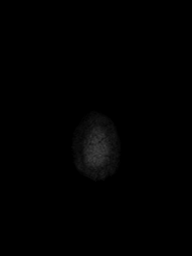

[Series 21: FLAIR · sagittal · 4.0mm · 0.72mm/px · 1 of 32 slices shown (2 of 2)]
[im 1/32]
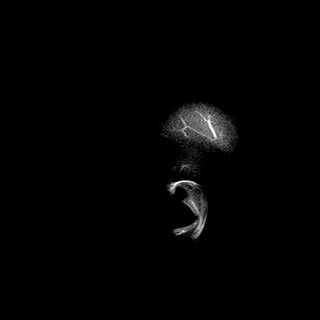

[Series 22: T2 post-contrast · coronal · 5.0mm · 0.36mm/px · 2 of 34 slices shown]
[im 1/34]
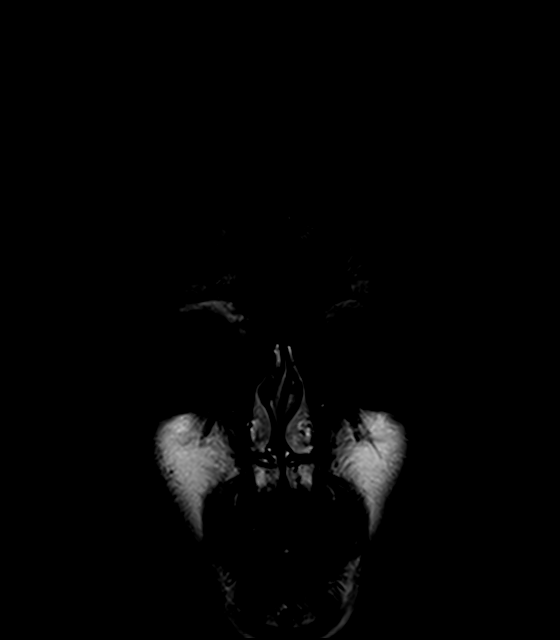
[im 34/34]
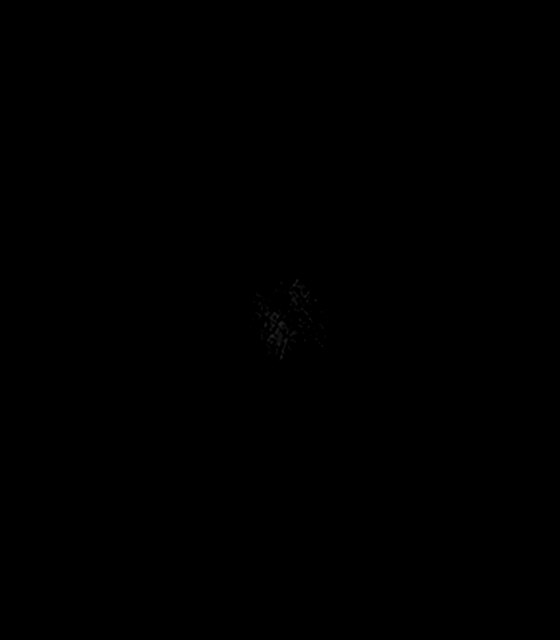

[Series 23: T1 · axial · 1.0mm · 0.94mm/px · z∈[-65,+94]mm · 7 of 160 slices shown (3 of 3)]
[im 1/160]
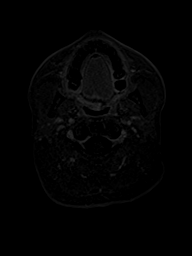
[im 27/160]
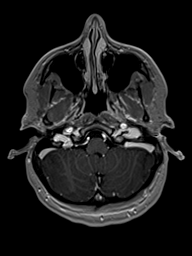
[im 54/160]
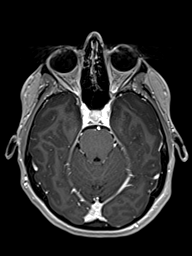
[im 80/160]
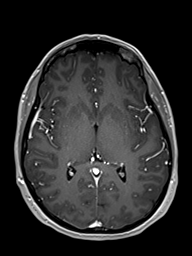
[im 107/160]
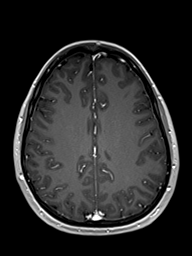
[im 133/160]
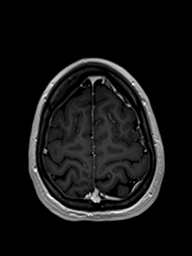
[im 160/160]
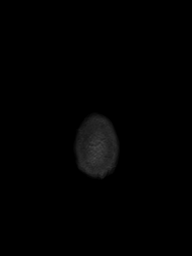

[Series 25: T1 post-contrast · coronal · 5.0mm · 0.90mm/px · 2 of 34 slices shown]
[im 1/34]
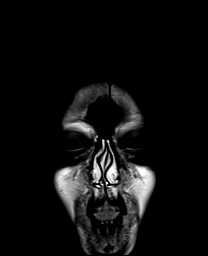
[im 34/34]
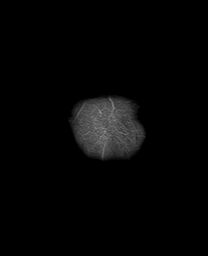

[48 of 48 positions shown; findings below may reference images not displayed]

FINDINGS: Brain: Subcortical T2 hyperintensities and those associated with the
callososeptal margin are stable. No restricted diffusion or
enhancement is present.

The lesion in the right frontal lobe on image 17 of series 17 was
not as well seen on axial images on the prior study but appears
stable in sagittal images from study [DATE].

Involvement the corpus callosum including the lesion in the splenium
is stable.

No new lesions are present.

No acute infarct, hemorrhage, or mass lesion is present. The
ventricles are of normal size. No significant extraaxial fluid
collection is present.

Postcontrast images demonstrate no pathologic enhancement.

Vascular: Flow is present in the major intracranial arteries.

Skull and upper cervical spine: The craniocervical junction is
normal. Upper cervical spine is within normal limits. Marrow signal
is unremarkable.

Sinuses/Orbits: The paranasal sinuses and mastoid air cells are
clear. The globes and orbits are within normal limits.
IMPRESSION: 1. Stable appearance of subcortical T2 hyperintensities compatible
with the given diagnosis of multiple sclerosis.
2. No new lesions or evidence for active demyelination.
3. No acute intracranial abnormality.

ADDENDUM:
Pathology revealed FIBROCYSTIC CHANGES INCLUDING APOCRINE
METAPLASIA- NO MALIGNANCY IDENTIFIED of the RIGHT breast, lower
outer quadrant, cylinder clip. This was found to be concordant by
Dr. SEINI.

Pathology revealed FIBROCYSTIC CHANGES INCLUDING APOCRINE
METAPLASIA- NO MALIGNANCY IDENTIFIED of the RIGHT breast, lower
inner quadrant, barbell clip. This was found to be concordant by Dr.
SEINI.

Pathology results were discussed with the patient by telephone. The
patient reported doing well after the biopsies with tenderness at
the sites. Post biopsy instructions and care were reviewed and
questions were answered. The patient was encouraged to call The

Bilateral breast MRI recommended in 6 months per protocol.

Pathology results reported by SEINI RN on [DATE].

*** End of Addendum ***
FINDINGS: Brain: Subcortical T2 hyperintensities and those associated with the
callososeptal margin are stable. No restricted diffusion or
enhancement is present.

The lesion in the right frontal lobe on image 17 of series 17 was
not as well seen on axial images on the prior study but appears
stable in sagittal images from study [DATE].

Involvement the corpus callosum including the lesion in the splenium
is stable.

No new lesions are present.

No acute infarct, hemorrhage, or mass lesion is present. The
ventricles are of normal size. No significant extraaxial fluid
collection is present.

Postcontrast images demonstrate no pathologic enhancement.

Vascular: Flow is present in the major intracranial arteries.

Skull and upper cervical spine: The craniocervical junction is
normal. Upper cervical spine is within normal limits. Marrow signal
is unremarkable.

Sinuses/Orbits: The paranasal sinuses and mastoid air cells are
clear. The globes and orbits are within normal limits.
IMPRESSION: 1. Stable appearance of subcortical T2 hyperintensities compatible
with the given diagnosis of multiple sclerosis.
2. No new lesions or evidence for active demyelination.
3. No acute intracranial abnormality.

## 2020-10-14 MED ORDER — GADOBUTROL 1 MMOL/ML IV SOLN
7.0000 mL | Freq: Once | INTRAVENOUS | Status: AC | PRN
  Administered 2020-10-14: 7 mL via INTRAVENOUS

## 2020-10-30 ENCOUNTER — Other Ambulatory Visit: Payer: Self-pay

## 2020-10-30 DIAGNOSIS — J4520 Mild intermittent asthma, uncomplicated: Secondary | ICD-10-CM

## 2020-10-30 MED ORDER — ALBUTEROL SULFATE HFA 108 (90 BASE) MCG/ACT IN AERS
2.0000 | INHALATION_SPRAY | Freq: Four times a day (QID) | RESPIRATORY_TRACT | 0 refills | Status: DC | PRN
Start: 2020-10-30 — End: 2022-06-05

## 2021-01-30 ENCOUNTER — Encounter: Payer: Self-pay | Admitting: Primary Care

## 2021-01-30 ENCOUNTER — Ambulatory Visit: Payer: Managed Care, Other (non HMO) | Admitting: Primary Care

## 2021-01-30 ENCOUNTER — Other Ambulatory Visit: Payer: Self-pay

## 2021-01-30 VITALS — BP 132/76 | HR 97 | Temp 98.1°F | Ht 67.0 in | Wt 144.0 lb

## 2021-01-30 DIAGNOSIS — I83811 Varicose veins of right lower extremities with pain: Secondary | ICD-10-CM | POA: Diagnosis not present

## 2021-01-30 DIAGNOSIS — G35 Multiple sclerosis: Secondary | ICD-10-CM

## 2021-01-30 DIAGNOSIS — Z9189 Other specified personal risk factors, not elsewhere classified: Secondary | ICD-10-CM

## 2021-01-30 DIAGNOSIS — R5383 Other fatigue: Secondary | ICD-10-CM

## 2021-01-30 DIAGNOSIS — F411 Generalized anxiety disorder: Secondary | ICD-10-CM | POA: Diagnosis not present

## 2021-01-30 DIAGNOSIS — R110 Nausea: Secondary | ICD-10-CM | POA: Diagnosis not present

## 2021-01-30 DIAGNOSIS — Z803 Family history of malignant neoplasm of breast: Secondary | ICD-10-CM

## 2021-01-30 MED ORDER — ONDANSETRON 4 MG PO TBDP
4.0000 mg | ORAL_TABLET | Freq: Three times a day (TID) | ORAL | 0 refills | Status: DC | PRN
Start: 2021-01-30 — End: 2021-07-25

## 2021-01-30 MED ORDER — HYDROXYZINE HCL 10 MG PO TABS: 10.0000 mg | ORAL_TABLET | Freq: Two times a day (BID) | ORAL | 0 refills | Status: DC | PRN

## 2021-01-30 NOTE — Patient Instructions (Signed)
Call [facility] regarding your varicose vein order.  Call to schedule your MRI as discussed.  It was a pleasure to see you today!

## 2021-01-30 NOTE — Assessment & Plan Note (Signed)
Following with urology, continue Glatiramer Acetate.

## 2021-01-30 NOTE — Assessment & Plan Note (Addendum)
Offered to resume Zoloft, she will think about this and update after her upcoming neurology visit.

## 2021-01-30 NOTE — Progress Notes (Signed)
Subjective:    Patient ID: Penny Hansen, female    DOB: 19-Jun-1985, 35 y.o.   MRN: [id number]  HPI  Penny Hansen is a very pleasant 35 y.o. female with a history of multiple sclerosis, GAD, high risk breast cancer who presents today to discuss varicose vein.  She is also needing an order for her MRI breasts which are due in January 2023, refills for Zofran and hydroxyzine.  Her varicose vein is located to the right medial lower leg for which she initially noticed numerous years ago. 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 Since then her varicose vein continues to be bothersome, painful, has noticed several other areas where the vein is more visible.   She only notices pain with high impact work outs. She does not wear compression socks during summer months, tries to wear them during cooler months. She is requesting a referral to IR for laser therapy treatment.  She will be due for an MRI of her breast in January 2023.  Significant family history of breast cancer in both her mother and older sister.  She underwent mammogram, MRI breast, biopsy earlier this year.  She continues to struggle with symptoms of fatigue.  She is not sure if this is secondary to her multiple sclerosis or from anxiety.  She is contemplating resuming Zoloft, is not sure yet.   Review of Systems  Constitutional:  Positive for fatigue.  Respiratory:  Negative for shortness of breath.   Cardiovascular:        Varicose vein  Skin:  Negative for wound.  Psychiatric/Behavioral:  The patient is nervous/anxious.        HPI        Past Medical History:  Diagnosis Date   Asthma    GERD (gastroesophageal reflux disease)    silent GERD which has caused cough in the past    Social History   Socioeconomic History   Marital status: Significant Other    Spouse name: Not on file   Number of children: 0   Years of education: Bachelors   Highest education level: Not on file   Occupational History   Occupation: [employer]  Tobacco Use   Smoking status: Never   Smokeless tobacco: Never  Vaping Use   Vaping Use: Never used  Substance and Sexual Activity   Alcohol use: Yes    Alcohol/week: 0.0 standard drinks    Comment: rare   Drug use: No   Sexual activity: Yes    Birth control/protection: Pill  Other Topics Concern   Not on file  Social History Narrative   Works as Regulatory affairs officer at [employer]   Married   No children   Moved to [place] in 2007 from [place]   Enjoys relaxing.    Right handed    Caffeine use: Coffee every day   Tea very rare   Social Determinants of Health   Financial Resource Strain: Not on file  Food Insecurity: Not on file  Transportation Needs: Not on file  Physical Activity: Not on file  Stress: Not on file  Social Connections: Not on file  Intimate Partner Violence: Not on file    Past Surgical History:  Procedure Laterality Date   TONSILECTOMY/ADENOIDECTOMY WITH MYRINGOTOMY  2012   WISDOM TOOTH EXTRACTION  2010    Family History  Problem Relation Age of Onset   Ulcerative colitis Mother        Living   Breast cancer Mother  Breast cancer Paternal Grandmother 28       breast cancer with mastectomy, chemo and radiation   Diabetes Paternal Grandmother    Cancer Paternal Grandmother        Breast   Hyperlipidemia Father        Living   Breast cancer Sister    Breast cancer Maternal Grandmother 60       breast cancer: lumpectomy   Cancer Maternal Grandmother        Breast   Heart disease Maternal Grandfather        MI; dissecting aneurysm   Heart attack Maternal Grandfather    Hypertension Maternal Grandfather    Prostate cancer Paternal Grandfather    Healthy Sister        x2    Allergies  Allergen Reactions   Amoxicillin-Pot Clavulanate Other (See Comments)    Gi upset    Current Outpatient Medications on File Prior to Visit  Medication Sig Dispense Refill   albuterol  (VENTOLIN HFA) 108 (90 Base) MCG/ACT inhaler Inhale 2 puffs into the lungs every 6 (six) hours as needed for wheezing or shortness of breath. 1 each 0   Ascorbic Acid (VITAMIN C) 100 MG tablet Take 100 mg by mouth as directed.     Cholecalciferol (VITAMIN D-3) 125 MCG (5000 UT) TABS      Glatiramer Acetate 40 MG/ML SOSY Inject 1 Dose into the skin 3 (three) times a week. Takes every Sunday, Tuesday, Thursday     guaiFENesin (MUCINEX) 600 MG 12 hr tablet Take by mouth 2 (two) times daily.     hydrOXYzine (ATARAX/VISTARIL) 10 MG tablet Take 1-2 tablets (10-20 mg total) by mouth 2 (two) times daily as needed for anxiety. 30 tablet 0   Multiple Vitamins-Minerals (MULTIVITAMIN GUMMIES WOMENS) CHEW Chew by mouth as directed.     ondansetron (ZOFRAN ODT) 4 MG disintegrating tablet Take 1 tablet (4 mg total) by mouth every 8 (eight) hours as needed for nausea or vomiting. 15 tablet 0   No current facility-administered medications on file prior to visit.    BP 132/76   Pulse 97   Temp 98.1 F (36.7 C) (Temporal)   Ht 5\' 7"  (1.702 m)   Wt 144 lb (65.3 kg)   SpO2 97%   BMI 22.55 kg/m  Objective:   Physical Exam Cardiovascular:     Rate and Rhythm: Normal rate and regular rhythm.     Comments: Moderate right medial calf varicose vein. Intact. Non tender. Non erythematous. Pulmonary:     Effort: Pulmonary effort is normal.     Breath sounds: Normal breath sounds.  Musculoskeletal:     Cervical back: Neck supple.  Skin:    General: Skin is warm and dry.          Assessment & Plan:      This visit occurred during the SARS-CoV-2 public health emergency.  Safety protocols were in place, including screening questions prior to the visit, additional usage of staff PPE, and extensive cleaning of exam room while observing appropriate contact time as indicated for disinfecting solutions.

## 2021-01-30 NOTE — Assessment & Plan Note (Addendum)
Offered to resume Zoloft, she will think about this and update after her upcoming neurology visit.  Refill provided for both Zofran and hydroxyzine, she uses sparingly.

## 2021-01-30 NOTE — Assessment & Plan Note (Signed)
Referral placed to interventional radiology for laser treatment as requested.  No apparent complications of vein.  She will undergo ultrasound per radiology protocol.

## 2021-02-01 ENCOUNTER — Other Ambulatory Visit: Payer: Self-pay | Admitting: Primary Care

## 2021-02-05 ENCOUNTER — Other Ambulatory Visit: Payer: Self-pay | Admitting: Primary Care

## 2021-02-05 DIAGNOSIS — I83811 Varicose veins of right lower extremities with pain: Secondary | ICD-10-CM

## 2021-02-13 ENCOUNTER — Encounter: Payer: Self-pay | Admitting: Neurology

## 2021-02-13 ENCOUNTER — Ambulatory Visit: Payer: Managed Care, Other (non HMO) | Admitting: Neurology

## 2021-02-13 VITALS — BP 124/73 | HR 79 | Ht 67.0 in | Wt 143.0 lb

## 2021-02-13 DIAGNOSIS — R5383 Other fatigue: Secondary | ICD-10-CM | POA: Diagnosis not present

## 2021-02-13 DIAGNOSIS — M533 Sacrococcygeal disorders, not elsewhere classified: Secondary | ICD-10-CM | POA: Diagnosis not present

## 2021-02-13 DIAGNOSIS — R200 Anesthesia of skin: Secondary | ICD-10-CM

## 2021-02-13 DIAGNOSIS — G35 Multiple sclerosis: Secondary | ICD-10-CM

## 2021-02-13 MED ORDER — ARMODAFINIL 200 MG PO TABS: ORAL_TABLET | ORAL | 5 refills | Status: DC

## 2021-02-13 MED ORDER — GLATIRAMER ACETATE 40 MG/ML ~~LOC~~ SOSY: 1.0000 | PREFILLED_SYRINGE | SUBCUTANEOUS | 11 refills | Status: DC

## 2021-02-13 NOTE — Progress Notes (Signed)
[facility]  PATIENT: Penny Hansen DOB: 1985/05/21  REFERRING DOCTOR OR PCP: Alma Friendly, NP SOURCE: Patient, notes from PCP, MRI images personally reviewed.  _________________________________   HISTORICAL  CHIEF COMPLAINT:  Chief Complaint  Patient presents with   Follow-up    Rm 1, alone. Here for 6 month MS f/u, on glatiramer. Pt reports doing well and stable. No new or worsening in sx. Pt reports being tired some days.     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    Update 02/13/2021:  She is on glatiramer.  She is tolerating it well.  She has not had any definite MS exacerbations.     She is having no difficulty with gait, balance, strength or sensation.  Bladder function is fine.  Vision is fine.  SI joint pain improved with the steroid pack.  It has not recurred.  She has more fatigue.    She note this more after she goes to the gym or if she has a longer day at work.   She sleeps 6-9 hours of sleep depending on her work schedule.   She usually works 12 hour shifts a few days in a row each week.  Mood and cognition are doing well.  She takes Vit D.   MS history: In June 2020, she had an MRI of the brain as part of a "dummy run" for an MS study.  It was abnormal showing foci consistent with MS.  In retrospect, back in 2014, she had some tingling in the right flank that lasted about 3 days.   She was working long shifts so assumed it was a muscle strain.  She also has had some sensory symptoms in the right arm.  She had additional MRI of the spine 10/11/2018 that showed a normal spinal cord.  Repeat MRI of the brain was performed 05/05/2019 and showed no new lesions.  Repeat MRI of the brain 03/28/2020 showed 2 new foci, one of the periventricular splenium of the corpus callosum and another in the right frontal lobe.  She then had a lumbar puncture 04/12/2020.  It was abnormal showing oligoclonal bands and elevated  IgG index. She started glatiramer February 2022.  Imaging and other studies: MRI 09/13/2018 showed T2/FLAIR hyperintense foci in the hemispheres including the periventricular and juxtacortical white matter.  None of the foci look to be acute.  MRI brain 10/11/2018 was unchanged.  Normal enhancement pattern.  MRI of the cervical and thoracic spine 10/11/2018 showed a normal spinal cord.  MRI of the brain 05/05/2019 showed no new lesions  MRI of the brain 03/28/2020 showed 2 new foci, 1 in the periventricular splenium and another in the right frontal lobe.  These do not enhance.  Echocardiogram with bubbles 09/30/2018 showed some bubbles crossing late (not c/w ASD/PFO but could be pulmonary AVMs.   Follow up transcranial dopplers with agitated saline showed no bubbles.     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   REVIEW OF SYSTEMS: Constitutional: No fevers, chills, sweats, or change in appetite Eyes: No visual changes, double vision, eye pain Ear, nose and throat: No hearing loss, ear pain, nasal congestion, sore throat Cardiovascular: No chest pain, palpitations Respiratory:  No shortness of breath at rest or with exertion.   No wheezes GastrointestinaI: No nausea, vomiting, diarrhea, abdominal pain, fecal incontinence Genitourinary:  No  dysuria, urinary retention or frequency.  No nocturia. Musculoskeletal:  No neck pain. SI pain on left Integumentary: No rash, pruritus, skin lesions Neurological: as above Psychiatric: No depression at this time.  No anxiety Endocrine: No palpitations, diaphoresis, change in appetite, change in weigh or increased thirst Hematologic/Lymphatic:  No anemia, purpura, petechiae. Allergic/Immunologic: No itchy/runny eyes, nasal congestion, recent allergic reactions, rashes  ALLERGIES: Allergies  Allergen Reactions   Amoxicillin-Pot Clavulanate Other (See Comments)     Gi upset    HOME MEDICATIONS:  Current Outpatient Medications:    albuterol (VENTOLIN HFA) 108 (90 Base) MCG/ACT inhaler, Inhale 2 puffs into the lungs every 6 (six) hours as needed for wheezing or shortness of breath., Disp: 1 each, Rfl: 0   Armodafinil 200 MG TABS, One po q AM, Disp: 30 tablet, Rfl: 5   Ascorbic Acid (VITAMIN C) 100 MG tablet, Take 100 mg by mouth as directed., Disp: , Rfl:    Cholecalciferol (VITAMIN D-3) 125 MCG (5000 UT) TABS, , Disp: , Rfl:    Glatiramer Acetate 40 MG/ML SOSY, Inject 1 Dose into the skin 3 (three) times a week. Takes every Sunday, Tuesday, Thursday, Disp: , Rfl:    guaiFENesin (MUCINEX) 600 MG 12 hr tablet, Take by mouth 2 (two) times daily., Disp: , Rfl:    hydrOXYzine (ATARAX/VISTARIL) 10 MG tablet, Take 1-2 tablets (10-20 mg total) by mouth 2 (two) times daily as needed for anxiety., Disp: 30 tablet, Rfl: 0   Multiple Vitamins-Minerals (MULTIVITAMIN GUMMIES WOMENS) CHEW, Chew by mouth as directed., Disp: , Rfl:    ondansetron (ZOFRAN ODT) 4 MG disintegrating tablet, Take 1 tablet (4 mg total) by mouth every 8 (eight) hours as needed for nausea or vomiting., Disp: 15 tablet, Rfl: 0  PAST MEDICAL HISTORY: Past Medical History:  Diagnosis Date   Asthma    GERD (gastroesophageal reflux disease)    silent GERD which has caused cough in the past    PAST SURGICAL HISTORY: Past Surgical History:  Procedure Laterality Date   TONSILECTOMY/ADENOIDECTOMY WITH MYRINGOTOMY  2012   WISDOM TOOTH EXTRACTION  2010    FAMILY HISTORY: Family History  Problem Relation Age of Onset   Ulcerative colitis Mother        Living   Breast cancer Mother    Breast cancer Paternal Grandmother 40       breast cancer with mastectomy, chemo and radiation   Diabetes Paternal Grandmother    Cancer Paternal Grandmother        Breast   Hyperlipidemia Father        Living   Breast cancer Sister    Breast cancer Maternal Grandmother 36       breast cancer:  lumpectomy   Cancer Maternal Grandmother        Breast   Heart disease Maternal Grandfather        MI; dissecting aneurysm   Heart attack Maternal Grandfather    Hypertension Maternal Grandfather    Prostate cancer Paternal Grandfather    Healthy Sister        x2    SOCIAL HISTORY:  Social History   Socioeconomic History   Marital status: Significant Other    Spouse name: Not on file   Number of children: 0   Years of education: Bachelors   Highest education level: Not on file  Occupational History   Occupation: [employer]  Tobacco Use   Smoking status: Never   Smokeless tobacco: Never  Vaping Use   Vaping Use:  Never used  Substance and Sexual Activity   Alcohol use: Yes    Alcohol/week: 0.0 standard drinks    Comment: rare   Drug use: No   Sexual activity: Yes    Birth control/protection: Pill  Other Topics Concern   Not on file  Social History Narrative   Works as Research officer, political party at [employer]   Married   No children   Moved to [place] in 2007 from [place]   Enjoys relaxing.    Right handed    Caffeine use: Coffee every day   Tea very rare   Social Determinants of Health   Financial Resource Strain: Not on file  Food Insecurity: Not on file  Transportation Needs: Not on file  Physical Activity: Not on file  Stress: Not on file  Social Connections: Not on file  Intimate Partner Violence: Not on file     PHYSICAL EXAM  Vitals:   02/13/21 1516  BP: 124/73  Pulse: 79  Weight: 143 lb (64.9 kg)  Height: 5\' 7"  (1.702 m)    Body mass index is 22.4 kg/m.   General: The patient is well-developed and well-nourished and in no acute distress.  She has tenderness over the left SI joint region.  There is no tenderness over the piriformis muscle or the trochanteric bursa or lumbar paraspinals.  Neurologic Exam  Mental status: The patient is alert and oriented x 3 at the time of the examination. The patient has apparent normal recent  and remote memory, with an apparently normal attention span and concentration ability.   Speech is normal.  Cranial nerves: Extraocular movements are full.There is good facial sensation to soft touch bilaterally.Facial strength is normal.  Trapezius and sternocleidomastoid strength is normal. No dysarthria is noted. No obvious hearing deficits are noted.  Motor:  Muscle bulk is normal.   Muscle tone is normal.  Strength is normal in the arms and legs.  Sensory: Sensory testing is intact to pinprick, soft touch and vibration sensation in all 4 extremities.  Coordination: Cerebellar testing reveals good finger-nose-finger and heel-to-shin bilaterally.  Gait and station: Station is normal.   Gait and tandem gait are normal.  Romberg is negative.  Reflexes: Deep tendon reflexes are symmetric and normal bilaterally.         ASSESSMENT AND PLAN  1. Relapsing remitting multiple sclerosis ([place])   2. Numbness   3. SI (sacroiliac) pain   4. Other fatigue     1.  She will continue Copaxone.  If skin reactions worsen, consider a different DMT. 2.   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   Return to see me in 6 months or sooner for new or worsening neurologic symptoms.    Maize Brittingham A. Felecia Shelling, MD, [facility] [id number], [id number] PM Certified in Neurology, Clinical Neurophysiology, Sleep Medicine and Neuroimaging  [facility] [address] [place], [place] [address] [phone]

## 2021-02-21 NOTE — Telephone Encounter (Addendum)
PA submitted on CMM/ [employer] [id number] Immediately approved  CaseId:[id number];Status:Approved;Review Type:Prior Auth;Coverage Start Date:02/21/2021;Coverage End Date:02/21/2022

## 2021-03-07 ENCOUNTER — Ambulatory Visit
Admission: RE | Admit: 2021-03-07 | Discharge: 2021-03-07 | Disposition: A | Discharge status: Home / Self Care | Payer: Managed Care, Other (non HMO) | Source: Ambulatory Visit | Attending: Primary Care | Admitting: Primary Care

## 2021-03-07 DIAGNOSIS — I83811 Varicose veins of right lower extremities with pain: Secondary | ICD-10-CM

## 2021-03-07 IMAGING — US US EXTREM LOW VENOUS*R*
1 series · 13 of 24 positions shown · non-contrast
Comparison: None.

CLINICAL DATA: Enlarging symptomatic right calf varicosities



[Series 1: us extrem low venous*right* · 0.06mm/px · 13 of 35 slices shown]
[im 1/35]
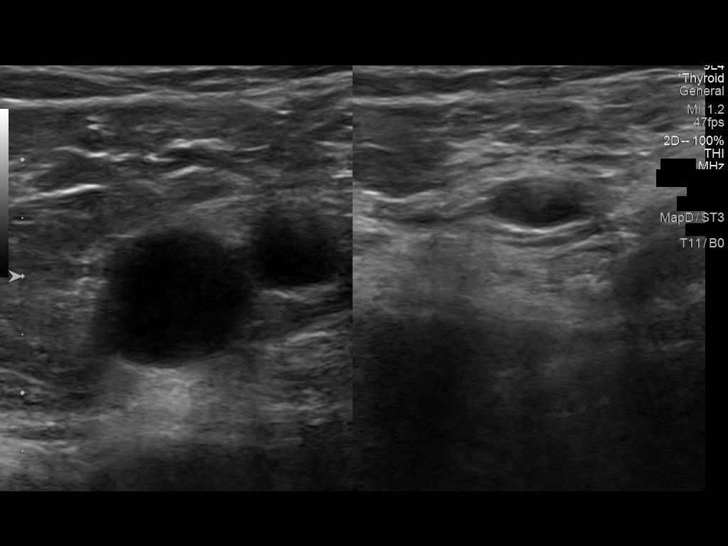
[im 3/35]
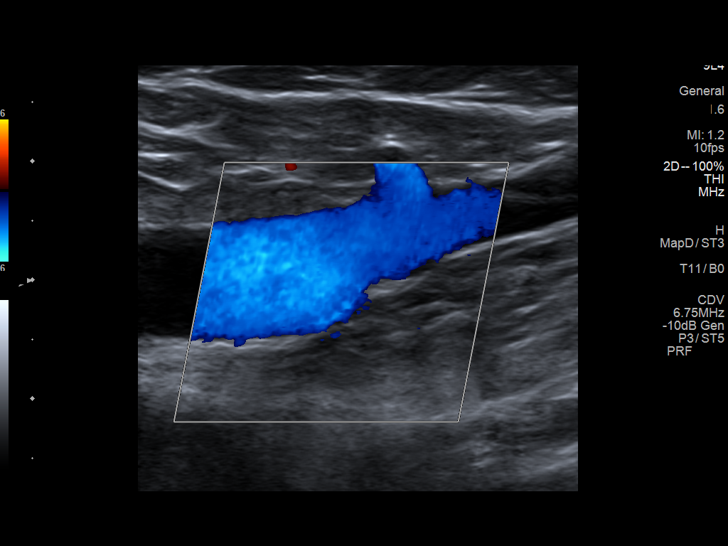
[im 6/35]
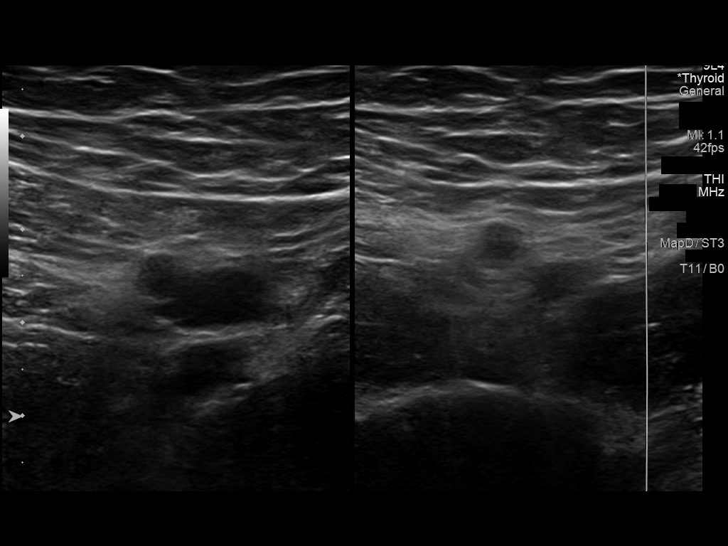
[im 9/35]
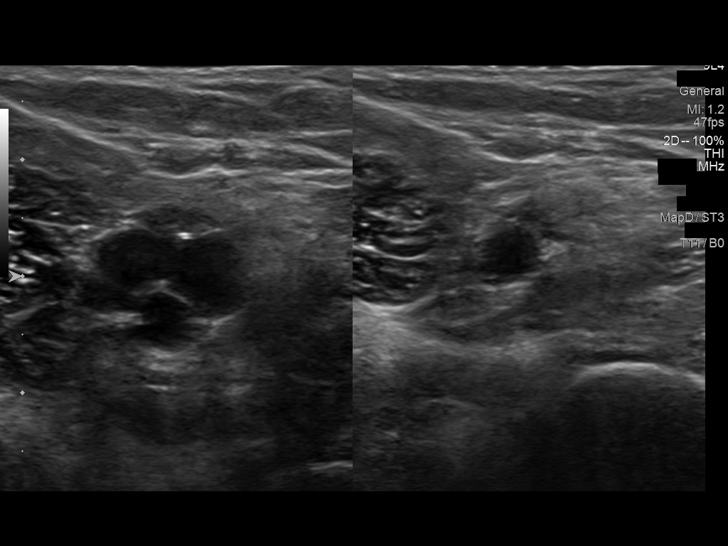
[im 12/35]
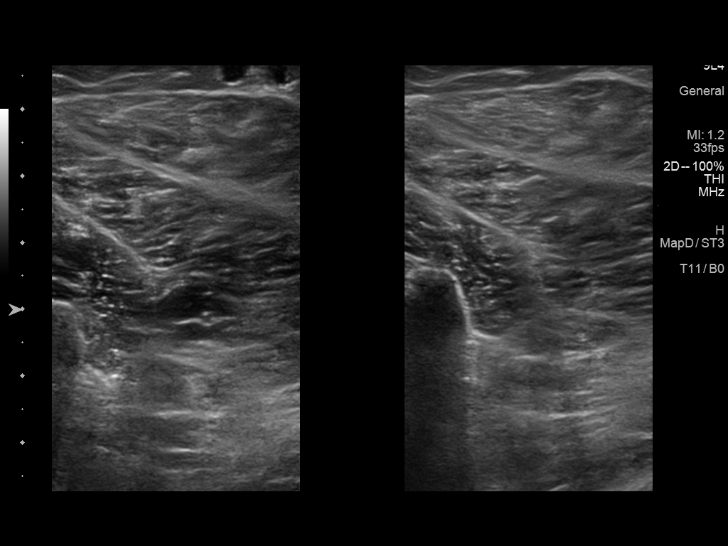
[im 15/35]
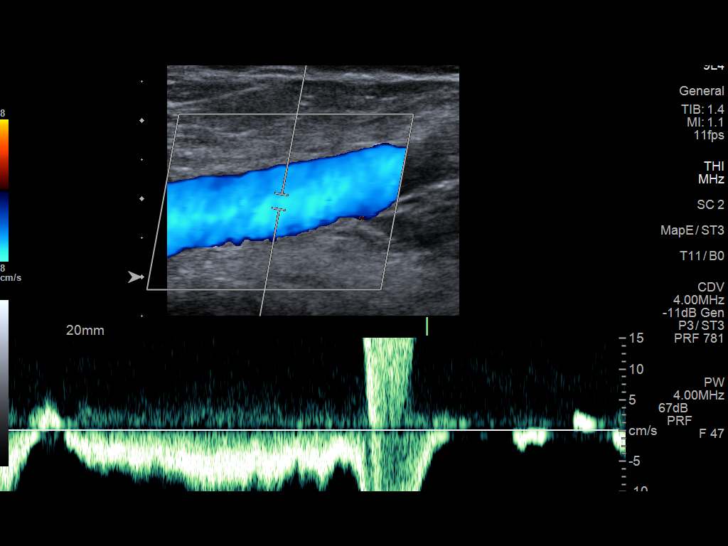
[im 18/35]
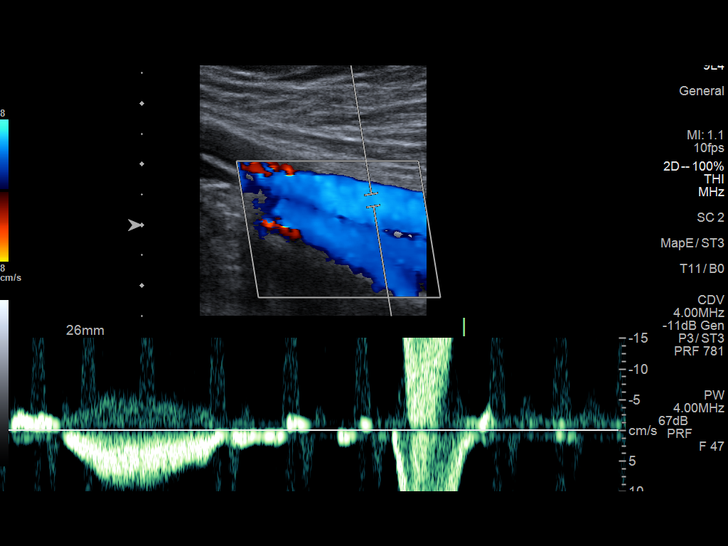
[im 20/35]
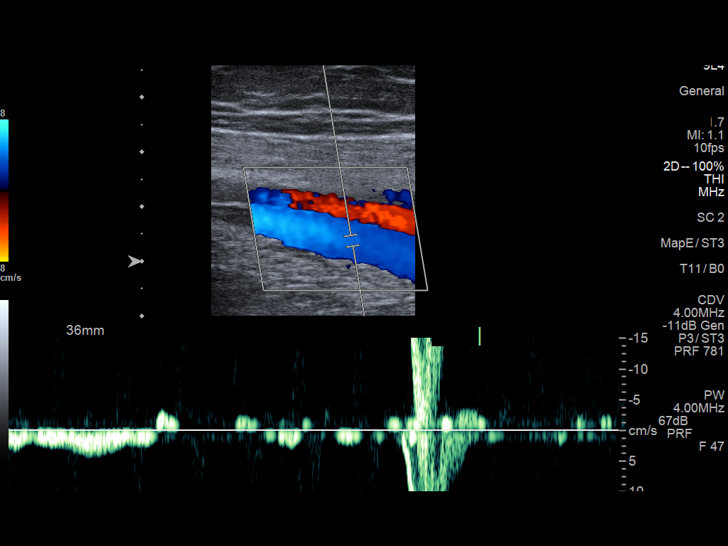
[im 23/35]
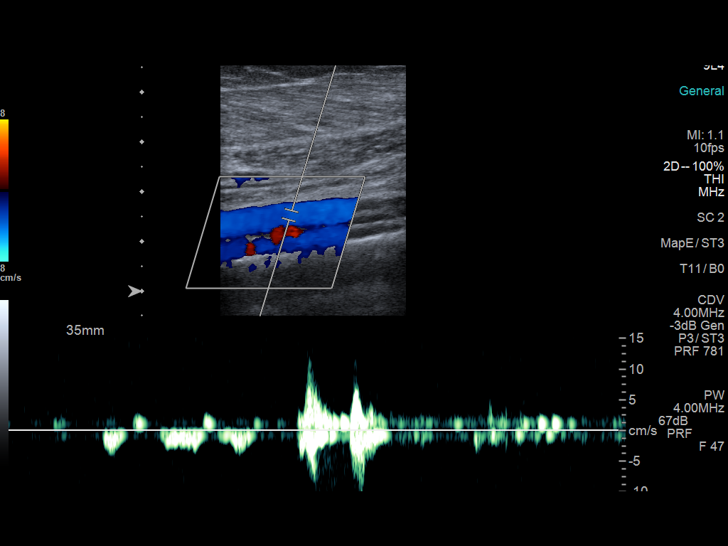
[im 26/35]
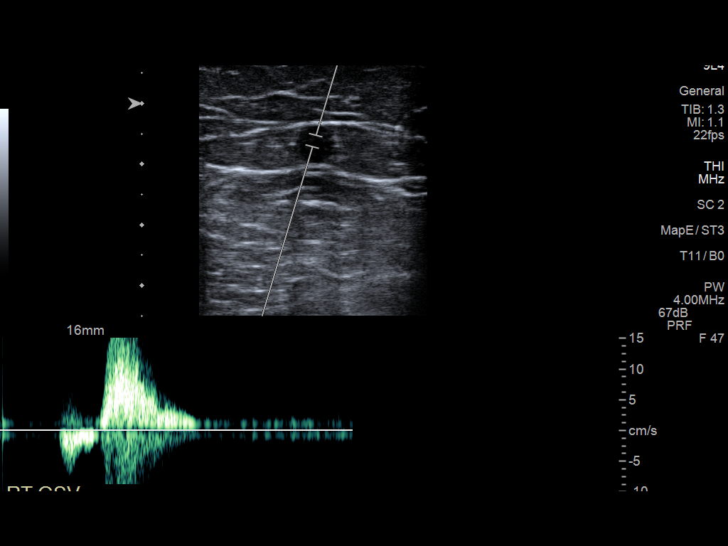
[im 29/35]
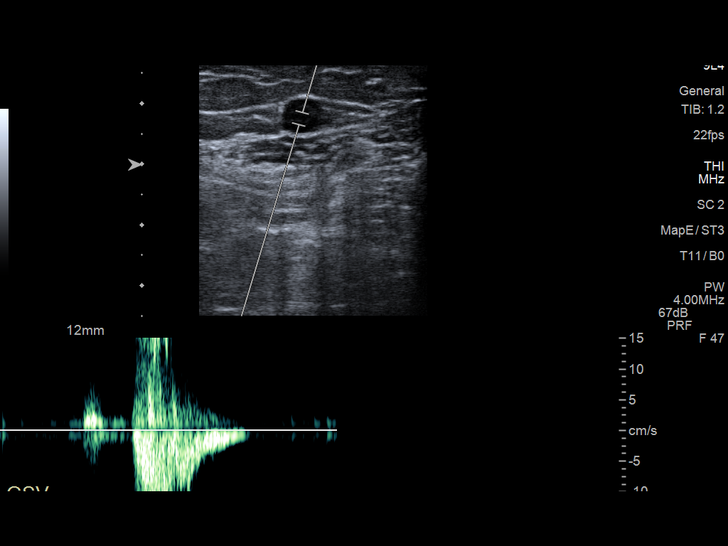
[im 32/35]
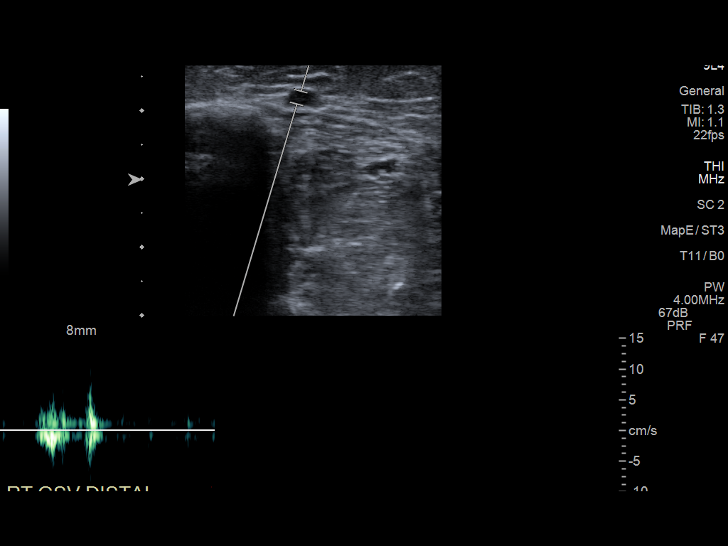
[im 35/35]
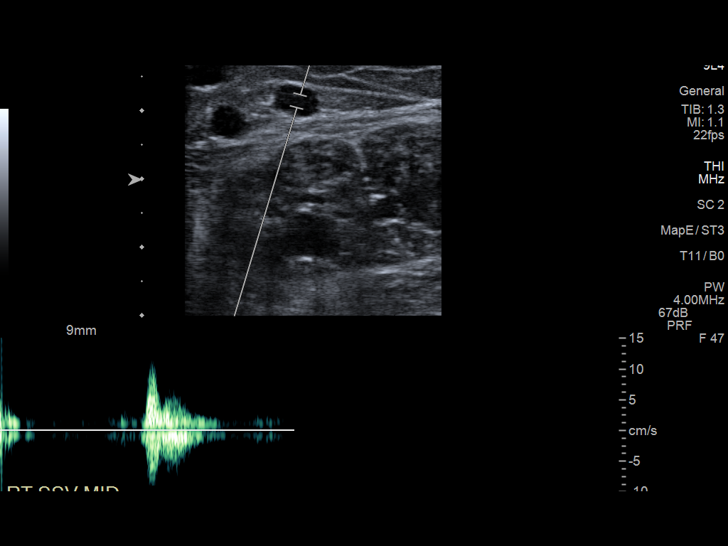

[13 of 24 positions shown; findings below may reference images not displayed]

FINDINGS: Contralateral Common Femoral Vein: Respiratory phasicity is normal
and symmetric with the symptomatic side. No evidence of thrombus.
Normal compressibility.

Common Femoral Vein: No evidence of thrombus. Normal
compressibility, respiratory phasicity and response to augmentation.

Saphenofemoral Junction: No evidence of thrombus. Normal
compressibility and flow on color Doppler imaging. Mildly dilated
and positive for venous insufficiency/reflux.

Profunda Femoral Vein: No evidence of thrombus. Normal
compressibility and flow on color Doppler imaging.

Femoral Vein: No evidence of thrombus. Normal compressibility,
respiratory phasicity and response to augmentation.

Popliteal Vein: No evidence of thrombus. Normal compressibility,
respiratory phasicity and response to augmentation.

Calf Veins: No evidence of thrombus. Normal compressibility and flow
on color Doppler imaging.

Superficial Great Saphenous Vein: Dilated from the saphenofemoral
junction to the ankle, up to 1 cm with diffuse venous
insufficiency/reflux. Symptomatic branching varicosities in the mid
and distal calf region also demonstrate venous insufficiency and
reflux with compression. No significant tortuosity or aneurysmal
segments. Anatomy is amenable to transcatheter laser therapy.
Varicose veins are also amenable to ultrasound foam sclerotherapy.

Small saphenous vein: Normal caliber. Negative for significant
reflux.

Venous Reflux: Positive for diffuse GSV venous insufficiency as
above.

Other Findings: Patent and compressible right mid and distal calf
symptomatic varicose veins.
IMPRESSION: Positive exam for significant diffuse right GSV venous insufficiency
resulting in the symptomatic right mid and distal calf varicose
veins.

Negative for DVT.

## 2021-03-07 NOTE — Consult Note (Signed)
Chief Complaint:  Enlarging symptomatic right calf varicosity  Referring Physician(s): Clark,Katherine K  History of Present Illness: Penny Hansen is a 35 y.o. female who presents for evaluation of slowly enlarging and symptomatic right calf varicose veins.  Review of her venous history performed.  She has had no prior treatments for varicose or spider veins.  No history of DVT or superficial thrombosis/thrombophlebitis.  She does have a family history of varicose veins which affected her mother.   She does wear over-the-counter knee-high support stockings.  Despite this conservative management her varicose veins have slowly worsened.  Throughout the day while at work she describes slow development of right calf region fatigue, fullness, warmth and pain.  This also is evident when she is outside taking a walk or while at the gym on the elliptical.  As result, the calf varicosities and associated symptoms are starting to affect her daily quality of life.  At this point, she would like to have some type of treatment for the varicose veins.  Past Medical History:  Diagnosis Date   Asthma    GERD (gastroesophageal reflux disease)    silent GERD which has caused cough in the past    Past Surgical History:  Procedure Laterality Date   TONSILECTOMY/ADENOIDECTOMY WITH MYRINGOTOMY  2012   WISDOM TOOTH EXTRACTION  2010    Allergies: Amoxicillin-pot clavulanate  Medications: Prior to Admission medications   Medication Sig Start Date End Date Taking? Authorizing Provider  albuterol (VENTOLIN HFA) 108 (90 Base) MCG/ACT inhaler Inhale 2 puffs into the lungs every 6 (six) hours as needed for wheezing or shortness of breath. 10/30/20   Doreene Nest, NP  Armodafinil 200 MG TABS One po q AM 02/13/21   Sater, Pearletha Furl, MD  Ascorbic Acid (VITAMIN C) 100 MG tablet Take 100 mg by mouth as directed.    [provider]  Cholecalciferol (VITAMIN D-3) 125 MCG (5000 UT) TABS   10/18/19   [provider]  Glatiramer Acetate 40 MG/ML SOSY Inject 1 Dose into the skin 3 (three) times a week. 02/13/21   Sater, Pearletha Furl, MD  guaiFENesin (MUCINEX) 600 MG 12 hr tablet Take by mouth 2 (two) times daily.    [provider]  hydrOXYzine (ATARAX/VISTARIL) 10 MG tablet Take 1-2 tablets (10-20 mg total) by mouth 2 (two) times daily as needed for anxiety. 01/30/21   Doreene Nest, NP  Multiple Vitamins-Minerals (MULTIVITAMIN GUMMIES WOMENS) CHEW Chew by mouth as directed.    [provider]  ondansetron (ZOFRAN ODT) 4 MG disintegrating tablet Take 1 tablet (4 mg total) by mouth every 8 (eight) hours as needed for nausea or vomiting. 01/30/21   Doreene Nest, NP     Family History  Problem Relation Age of Onset   Ulcerative colitis Mother        Living   Breast cancer Mother    Breast cancer Paternal Grandmother 45       breast cancer with mastectomy, chemo and radiation   Diabetes Paternal Grandmother    Cancer Paternal Grandmother        Breast   Hyperlipidemia Father        Living   Breast cancer Sister    Breast cancer Maternal Grandmother 43       breast cancer: lumpectomy   Cancer Maternal Grandmother        Breast   Heart disease Maternal Grandfather        MI; dissecting aneurysm  Heart attack Maternal Grandfather    Hypertension Maternal Grandfather    Prostate cancer Paternal Grandfather    Healthy Sister        x2    Social History   Socioeconomic History   Marital status: Significant Other    Spouse name: Not on file   Number of children: 0   Years of education: Bachelors   Highest education level: Not on file  Occupational History   Occupation: [employer]  Tobacco Use   Smoking status: Never   Smokeless tobacco: Never  Vaping Use   Vaping Use: Never used  Substance and Sexual Activity   Alcohol use: Yes    Alcohol/week: 0.0 standard drinks    Comment: rare   Drug use: No   Sexual activity: Yes     Birth control/protection: Pill  Other Topics Concern   Not on file  Social History Narrative   Works as Research officer, political party at [employer]   Married   No children   Moved to [place] in 2007 from [place]   Enjoys relaxing.    Right handed    Caffeine use: Coffee every day   Tea very rare   Social Determinants of Health   Financial Resource Strain: Not on file  Food Insecurity: Not on file  Transportation Needs: Not on file  Physical Activity: Not on file  Stress: Not on file  Social Connections: Not on file     Review of Systems: A 12 point ROS discussed and pertinent positives are indicated in the HPI above.  All other systems are negative.  Review of Systems  Vital Signs: BP 119/73 (BP Location: Left Arm)   Pulse 84   SpO2 99%   Physical Exam Constitutional:      General: She is not in acute distress.    Appearance: She is normal weight. She is not toxic-appearing.  Musculoskeletal:        General: No swelling. Normal range of motion.     Comments: Moderate sized medial mid and distal calf varicosities are evident when the patient is standing.  Varicose veins or compressible.  Mild overlying hyperpigmentation.  Slight overlying tenderness.  Skin intact.  No skin lesions or ulcerations.  No significant edema.  Normal peripheral pulses.     Imaging: US Venous Img Lower Unilateral Right (DVT)  Result Date: 03/07/2021 CLINICAL DATA:  Enlarging symptomatic right calf varicosities EXAM: RIGHT LOWER EXTREMITY VENOUS DOPPLER ULTRASOUND TECHNIQUE: Gray-scale sonography with graded compression, as well as color Doppler and duplex ultrasound were performed to evaluate the lower extremity deep venous systems from the level of the common femoral vein and including the common femoral, femoral, profunda femoral, popliteal and calf veins including the posterior tibial, peroneal and gastrocnemius veins when visible. The superficial great saphenous vein was also interrogated.  Spectral Doppler was utilized to evaluate flow at rest and with distal augmentation maneuvers in the common femoral, femoral and popliteal veins. COMPARISON:  None. FINDINGS: Contralateral Common Femoral Vein: Respiratory phasicity is normal and symmetric with the symptomatic side. No evidence of thrombus. Normal compressibility. Common Femoral Vein: No evidence of thrombus. Normal compressibility, respiratory phasicity and response to augmentation. Saphenofemoral Junction: No evidence of thrombus. Normal compressibility and flow on color Doppler imaging. Mildly dilated and positive for venous insufficiency/reflux. Profunda Femoral Vein: No evidence of thrombus. Normal compressibility and flow on color Doppler imaging. Femoral Vein: No evidence of thrombus. Normal compressibility, respiratory phasicity and response to augmentation. Popliteal Vein: No evidence of thrombus. Normal  compressibility, respiratory phasicity and response to augmentation. Calf Veins: No evidence of thrombus. Normal compressibility and flow on color Doppler imaging. Superficial Great Saphenous Vein: Dilated from the saphenofemoral junction to the ankle, up to 1 cm with diffuse venous insufficiency/reflux. Symptomatic branching varicosities in the mid and distal calf region also demonstrate venous insufficiency and reflux with compression. No significant tortuosity or aneurysmal segments. Anatomy is amenable to transcatheter laser therapy. Varicose veins are also amenable to ultrasound foam sclerotherapy. Small saphenous vein: Normal caliber. Negative for significant reflux. Venous Reflux: Positive for diffuse GSV venous insufficiency as above. Other Findings: Patent and compressible right mid and distal calf symptomatic varicose veins. IMPRESSION: Positive exam for significant diffuse right GSV venous insufficiency resulting in the symptomatic right mid and distal calf varicose veins. Negative for DVT. Electronically Signed   By: Jerilynn Mages.  Aidynn Krenn  M.D.   On: 03/07/2021 11:29   [place] RAD EVAL AND MGMT  Result Date: 03/07/2021 Please refer to "Notes" to see consult details.   Labs:  CBC: Recent Labs    04/19/20 1207  WBC 5.5  HGB 14.0  HCT 41.0  PLT 346    COAGS: No results for input(s): INR, APTT in the last 8760 hours.  BMP: No results for input(s): NA, K, CL, CO2, GLUCOSE, BUN, CALCIUM, CREATININE, GFRNONAA, GFRAA in the last 8760 hours.  Invalid input(s): CMP  LIVER FUNCTION TESTS: Recent Labs    04/12/20 0808 04/19/20 1207  BILITOT  --  0.5  AST  --  16  ALT  --  14  ALKPHOS  --  52  PROT  --  7.8  ALBUMIN 4.8 5.2*     Assessment and Plan:  Slowly enlarging symptomatic right calf varicose veins related to significant diffuse right GSV venous insufficiency/reflux by ultrasound.  GSV anatomy is amenable to transcatheter laser occlusion.  Varicosities are amenable to ultrasound foam sclerotherapy.  Associated symptoms including leg fatigue, fullness, pain, and warmth are progressive throughout the day, when walking, and while exercising.  The symptoms have progressed despite conservative management with Advil and knee-high compression stockings.  Plan: Recommend right GSV transcatheter laser occlusion and varicose vein ultrasound foam sclerotherapy.  This information will be submitted for insurance evaluation.  Thank you for this interesting consult.  I greatly enjoyed meeting Janera Brien and look forward to participating in their care.  A copy of this report was sent to the requesting provider on this date.  Electronically Signed: Greggory Keen 03/07/2021, 11:34 AM   I spent a total of  60 Minutes   in face to face in clinical consultation, greater than 50% of which was counseling/coordinating care for this patient with symptomatic enlarging right calf varicosity related to GSV venous insufficiency

## 2021-03-27 ENCOUNTER — Ambulatory Visit
Admission: RE | Admit: 2021-03-27 | Discharge: 2021-03-27 | Disposition: A | Discharge status: Home / Self Care | Payer: Managed Care, Other (non HMO) | Source: Ambulatory Visit | Attending: Primary Care | Admitting: Primary Care

## 2021-03-27 ENCOUNTER — Other Ambulatory Visit: Payer: Self-pay

## 2021-03-27 DIAGNOSIS — Z9189 Other specified personal risk factors, not elsewhere classified: Secondary | ICD-10-CM

## 2021-03-27 DIAGNOSIS — Z803 Family history of malignant neoplasm of breast: Secondary | ICD-10-CM

## 2021-03-27 IMAGING — MR MR BREAST BILAT WO/W CM
8 of 12 series · 32 of 48 positions shown · IV contrast (7 ml gadavist)
Comparison: MRI [DATE] and [DATE] and screening mammogram
on [DATE]

CLINICAL DATA: Follow-up after MR guided core biopsy of 2 areas in
the RIGHT breast. Pathology showed benign, concordant fibrocystic
changes with apocrine metaplasia in the LOWER OUTER QUADRANT
(cylinder clip). Pathology showed benign, concordant fibrocystic
changes including apocrine metaplasia in the LOWER INNER QUADRANT
(barbell clip). Patient's mother and sister were diagnosed with
breast cancer at age 36.

EXAM:
BILATERAL BREAST MRI WITH AND WITHOUT CONTRAST
TECHNIQUE: Multiplanar, multisequence MR images of both breasts were obtained
prior to and following the intravenous administration of 7 ml of
Gadavist

[Series 2: t2_tirm_tra ipat (a-p) · axial · 3.0mm · 0.70mm/px · 1 of 57 slices shown]
[im 1/57]
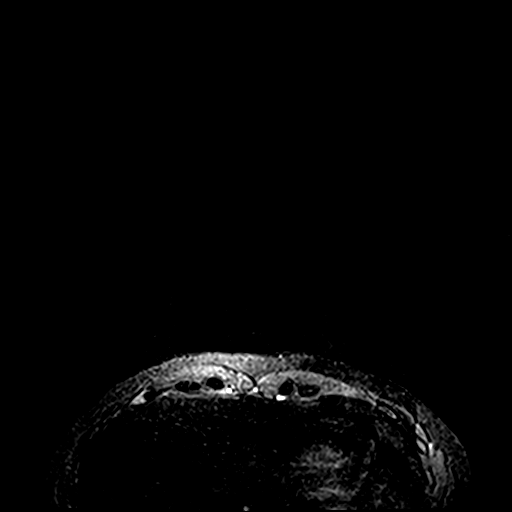

[Series 3: fl3d pre-cm no · axial · non-contrast · 1.2mm · 0.94mm/px · z∈[-79,+93]mm · 5 of 144 slices shown]
[im 1/144]
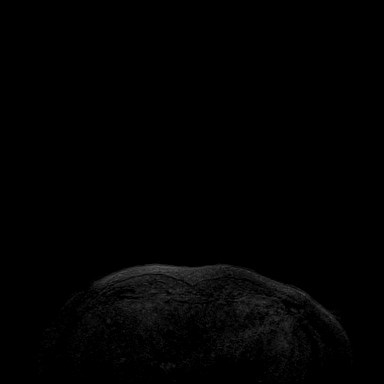
[im 36/144]
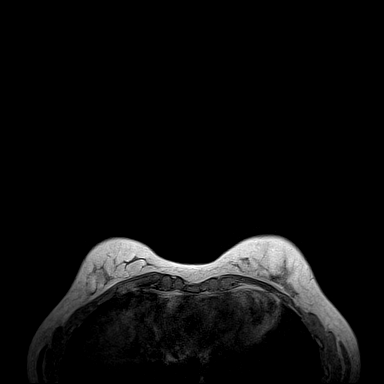
[im 72/144]
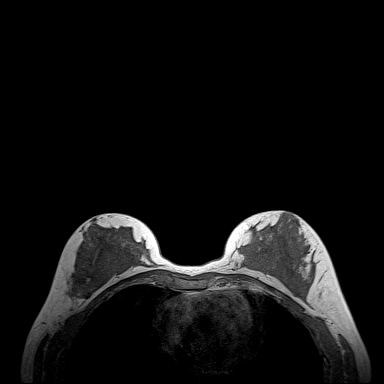
[im 108/144]
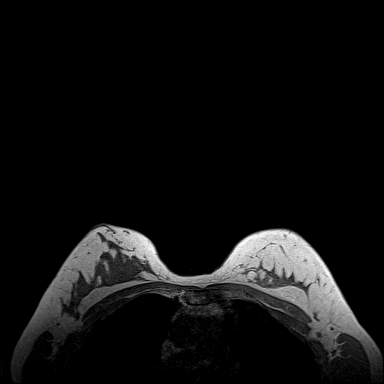
[im 144/144]
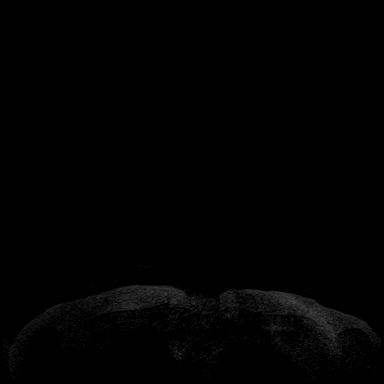

[Series 4: fl3d pre-cm · axial · non-contrast · 1.2mm · 0.94mm/px · z∈[-79,+93]mm · 5 of 144 slices shown]
[im 1/144]
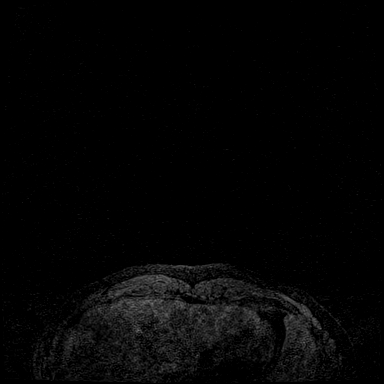
[im 36/144]
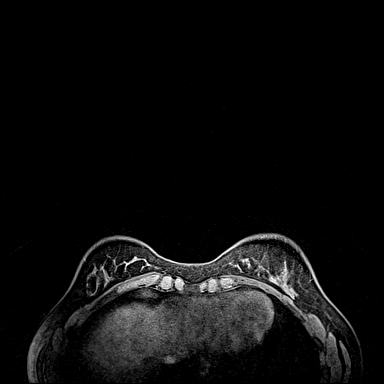
[im 72/144]
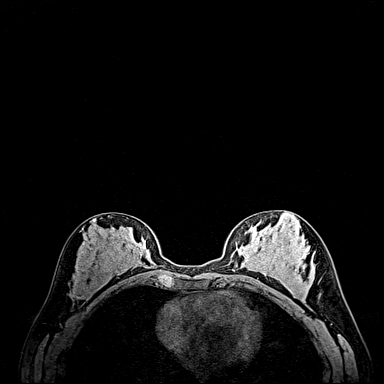
[im 108/144]
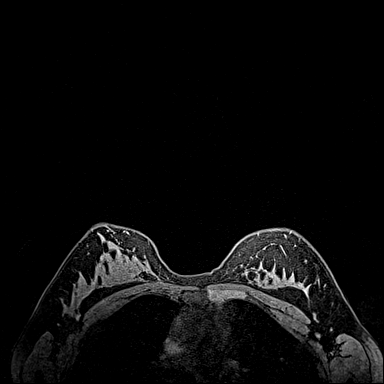
[im 144/144]
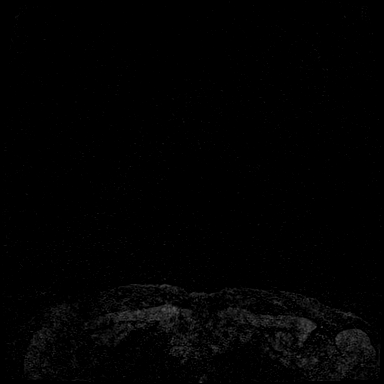

[Series 5: fl3d post immediate · axial · 1.2mm · 0.94mm/px · z∈[-79,+93]mm · 5 of 144 slices shown (1 of 3)]
[im 1/144]
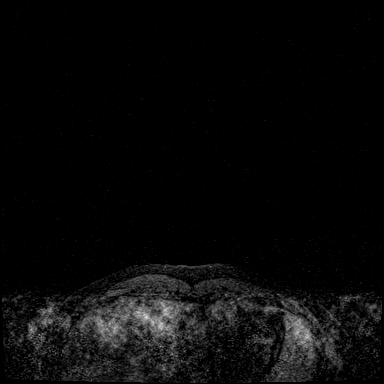
[im 36/144]
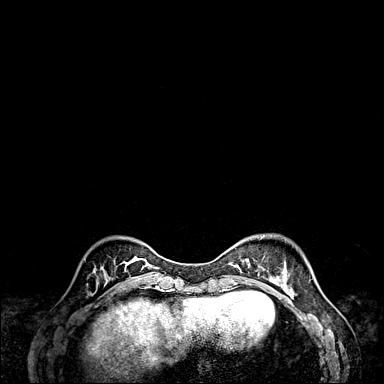
[im 72/144]
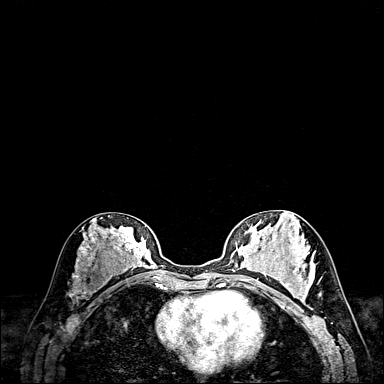
[im 108/144]
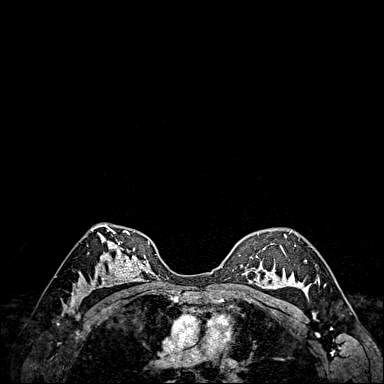
[im 144/144]
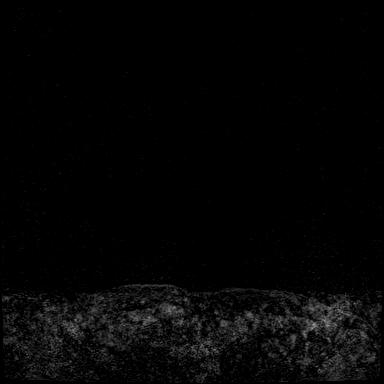

[Series 6: fl3d post immediate · axial · 1.2mm · 0.94mm/px · z∈[-79,+93]mm · 5 of 144 slices shown (2 of 3)]
[im 1/144]
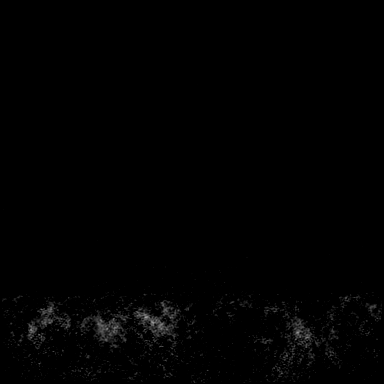
[im 36/144]
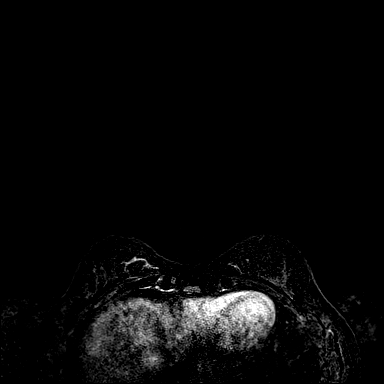
[im 72/144]
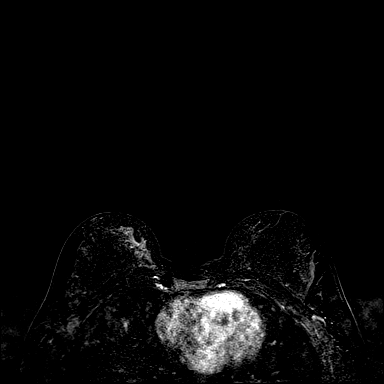
[im 108/144]
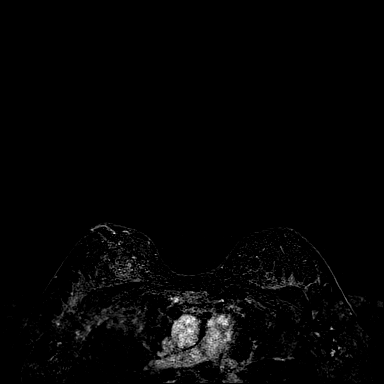
[im 144/144]
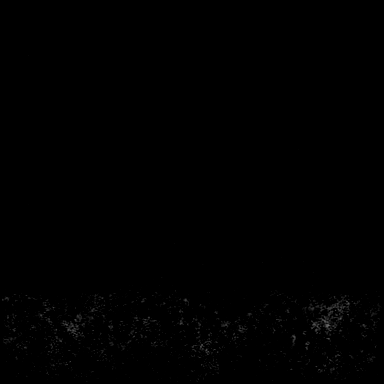

[Series 7: fl3d post immediate · axial · 172.8mm · 0.94mm/px · 1 of 1 slices shown (3 of 3)]
[im 1/1]
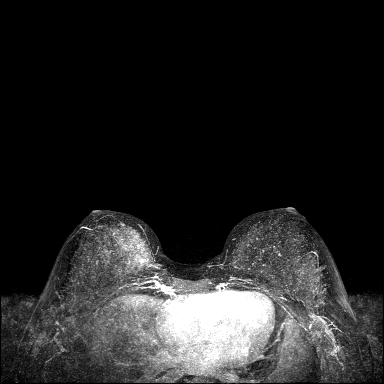

[Series 8: fl3d post 3min · axial · 1.2mm · 0.94mm/px · z∈[-79,+93]mm · 6 of 144 slices shown]
[im 1/144]
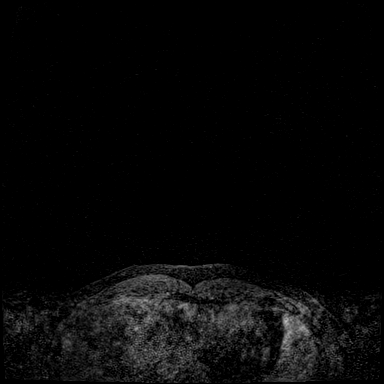
[im 29/144]
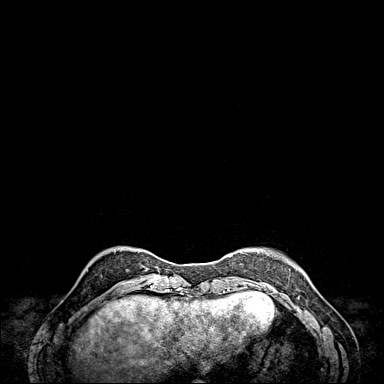
[im 58/144]
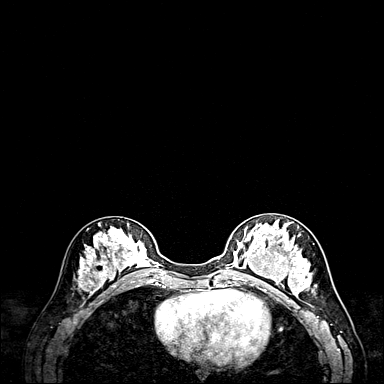
[im 86/144]
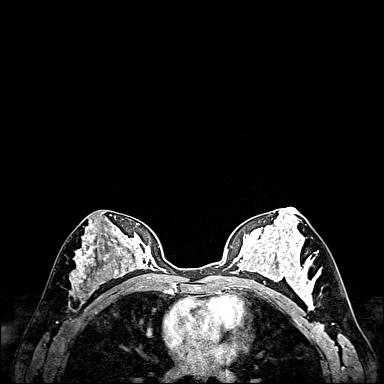
[im 115/144]
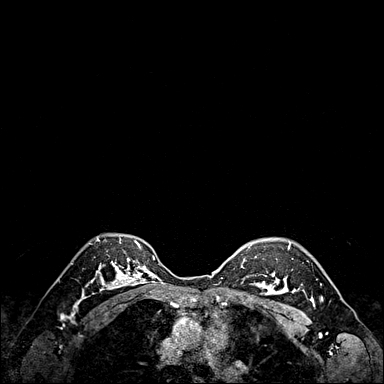
[im 144/144]
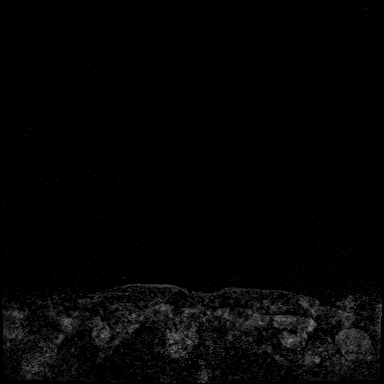

[Series 9: fl3d post 3min_sub · axial · 1.2mm · 0.94mm/px · z∈[-79,+23]mm · 4 of 144 slices shown]
[im 1/144]
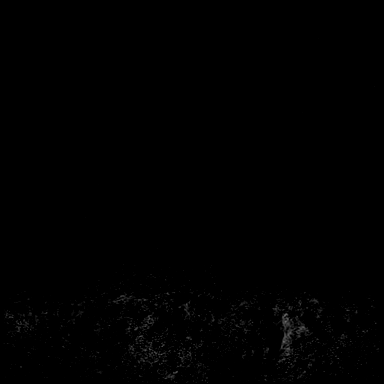
[im 29/144]
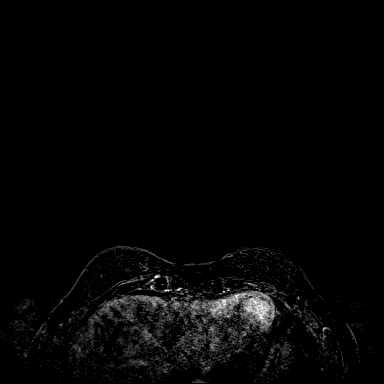
[im 58/144]
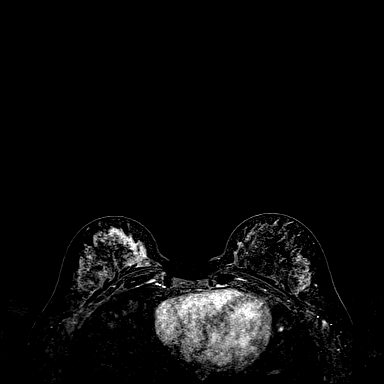
[im 86/144]
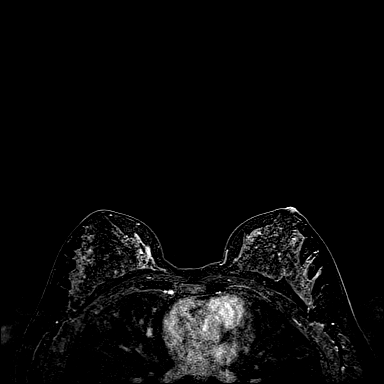

[32 of 48 positions shown; findings below may reference images not displayed]

Three-dimensional MR images were rendered by post-processing of the
original MR data on an independent workstation. The
three-dimensional MR images were interpreted, and findings are
reported in the following complete MRI report for this study. Three
dimensional images were evaluated at the independent interpreting
workstation using the DynaCAD thin client.
FINDINGS: Breast composition: d. Extreme fibroglandular tissue.

Background parenchymal enhancement: Moderate.

Right breast: Tissue marker clip is identified in the LOWER OUTER
QUADRANT of the RIGHT breast, immediately adjacent to small focus of
enhancement. Concordant benign fibrocystic changes and apocrine
metaplasia were identified on biopsy of this region.

Within the LOWER INNER QUADRANT of the LEFT breast, tissue marker
clip is identified within an area of non mass enhancement, biopsy
proven to show concordant fibrocystic changes with apocrine
metaplasia. No new or suspicious findings in the RIGHT breast.

Left breast: No mass or abnormal enhancement.

Lymph nodes: No abnormal appearing lymph nodes.

Ancillary findings:  None.
IMPRESSION: No MRI evidence for malignancy.

Expected changes after MR guided core biopsies of the LOWER OUTER
QUADRANT and LOWER INNER QUADRANT of the RIGHT breast.

RECOMMENDATION:
Recommend annual screening mammogram beginning at age 35.

Based on the recommendations of the American Cancer Society, annual
screening MRI is suggested in addition to annual mammography if the
patient has an estimated lifetime risk of developing breast cancer
which is greater than 20%.

BI-RADS CATEGORY  2: Benign.

## 2021-03-27 MED ORDER — GADOBUTROL 1 MMOL/ML IV SOLN
7.0000 mL | Freq: Once | INTRAVENOUS | Status: AC | PRN
  Administered 2021-03-27: 10:00:00 7 mL via INTRAVENOUS

## 2021-04-11 ENCOUNTER — Telehealth: Payer: Self-pay | Admitting: *Deleted

## 2021-04-11 NOTE — Telephone Encounter (Signed)
Received PA request on CMM. Key: [id number]. Tried submitting but received following response: "Drug is covered by current benefit plan. No further PA activity needed"

## 2021-04-22 NOTE — Telephone Encounter (Signed)
Received PA request via fax/[employer]. Completed and faxed back to [phone]. Received fax confirmation, waiting on determination.

## 2021-04-29 ENCOUNTER — Other Ambulatory Visit: Payer: Self-pay | Admitting: Physician Assistant

## 2021-04-30 ENCOUNTER — Other Ambulatory Visit: Payer: Self-pay | Admitting: Interventional Radiology

## 2021-04-30 DIAGNOSIS — I872 Venous insufficiency (chronic) (peripheral): Secondary | ICD-10-CM

## 2021-04-30 NOTE — Telephone Encounter (Signed)
[facility] called and said they have not received anything for this prescription and are looking for an update. [phone]

## 2021-04-30 NOTE — Telephone Encounter (Signed)
We have note received determination from [place] on PA for glatiramer yet. I called [employer] at [phone]. Was on hold for 25 min before speaking w/ Misty Stanley. She checked on status. They did not receive fax sent 04/22/21. I completed PA over the phone. New case# [id number]. They will fax determination. She marked this urgent.  I re-faxed PA request to them also at [phone]. Received fax confirmation. It was marked urgent again.   Last refill sent to [facility] pharmacy 02/13/21 #[id number], 11 refills.

## 2021-05-06 ENCOUNTER — Other Ambulatory Visit: Payer: Self-pay | Admitting: Interventional Radiology

## 2021-05-06 DIAGNOSIS — I872 Venous insufficiency (chronic) (peripheral): Secondary | ICD-10-CM

## 2021-05-07 ENCOUNTER — Telehealth: Payer: Self-pay

## 2021-05-07 NOTE — Telephone Encounter (Signed)
Received fax from [place] that PA approved 05/07/21-05/07/22. Request ID: [id number].

## 2021-05-07 NOTE — Telephone Encounter (Signed)
Valium called into patients pharmacy for her to take to her appointment on 05/16/21 per Dr. Annamaria Boots.  Pt aware of prescription instructions and instructed to have a driver the day of taking the medication as it may cause drowsiness. Pt verbalized understanding. Pt also advised not to take any other muscle relaxer's or PRN anxiety medications while taking this medication.

## 2021-05-16 ENCOUNTER — Other Ambulatory Visit: Payer: Self-pay

## 2021-05-16 ENCOUNTER — Ambulatory Visit
Admission: RE | Admit: 2021-05-16 | Discharge: 2021-05-16 | Disposition: A | Discharge status: Home / Self Care | Payer: Managed Care, Other (non HMO) | Source: Ambulatory Visit | Attending: Interventional Radiology | Admitting: Interventional Radiology

## 2021-05-16 DIAGNOSIS — I872 Venous insufficiency (chronic) (peripheral): Secondary | ICD-10-CM

## 2021-05-16 HISTORY — PX: IR EMBO VENOUS NOT HEMORR HEMANG  INC GUIDE ROADMAPPING: IMG5447

## 2021-05-16 IMAGING — US IR EMBO VENOUS NOT [PERSON_NAME]  INC GUIDE ROADMAPPING
1 series · 9 of 9 positions shown · non-contrast
Comparison: none

CLINICAL DATA: Symptomatic lower extremity varicose veins. Diffuse
right GSV venous insufficiency with enlarging symptomatic right calf
varicosities. Progressive right calf and lower extremity fatigue,
fullness, and pain

EXAM:
TRANSCATHETER LASER OCCLUSION RIGHT GREATER SAPHENOUS VEIN WITH
ULTRASOUND GUIDANCE:
TECHNIQUE: Survey ultrasound of the leg was performed and the course of the
greater saphenous vein was marked on the skin. Overlying skin
prepped with chloraprep, draped in usual sterile fashion. After
local anesthetic using 1% lidocaine, the greater saphenous vein was
accessed at the high ankle level under ultrasound with a 21-gauge
micropuncture needle. A 018 guidewire advanced easily. This was
exchanged using a transitional dilator for a 035 J wire. Over this,
the 6 French delivery sheath was advanced beyond the saphenofemoral
junction. The laser fiber was advanced and positioned 2 cm from the
saphenofemoral junction. Tumescent dilute 0.1% lidocaine 250mL used
along the planned treatment length. Ultrasound was used to confirm
appropriate tumescent anesthesia and to verify that all segments
were at least 1 cm from the skin surface. The laser fiber was
activated while being withdrawn over the treatment length,
delivering [V8] over [V8]. The catheter and sheath were
removed and hemostasis easily achieved at the site. Patient was
placed in graduated compression stockings and ambulated for 20
minutes without difficulty. Patient tolerated the procedure well,
with no immediate complication.

[Series 1: ir embo venous not (person_name) inc guide roadmap · 0.05mm/px · 7 acquisitions, 9 frames shown]
[im 1/7]
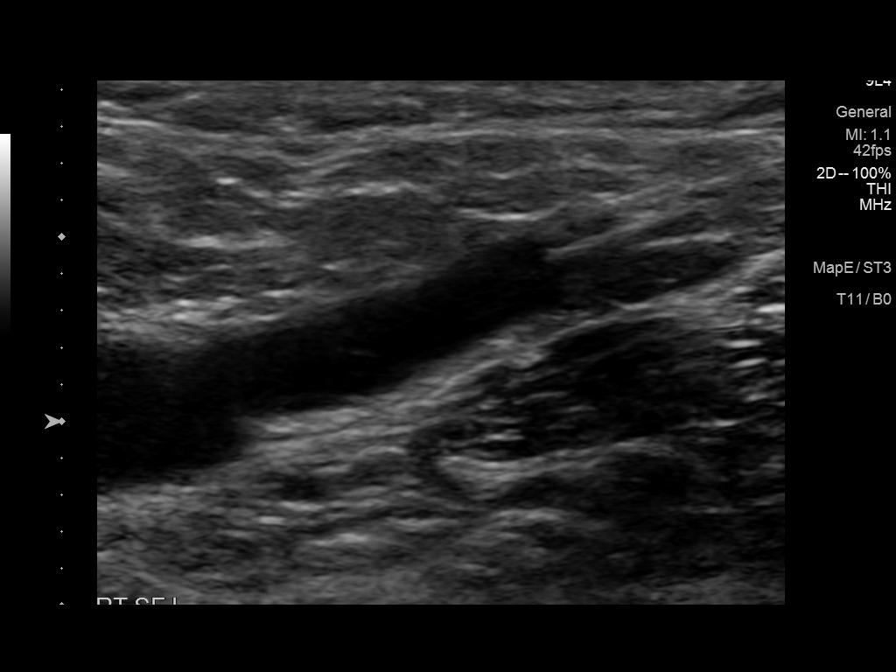
[im 2/7]
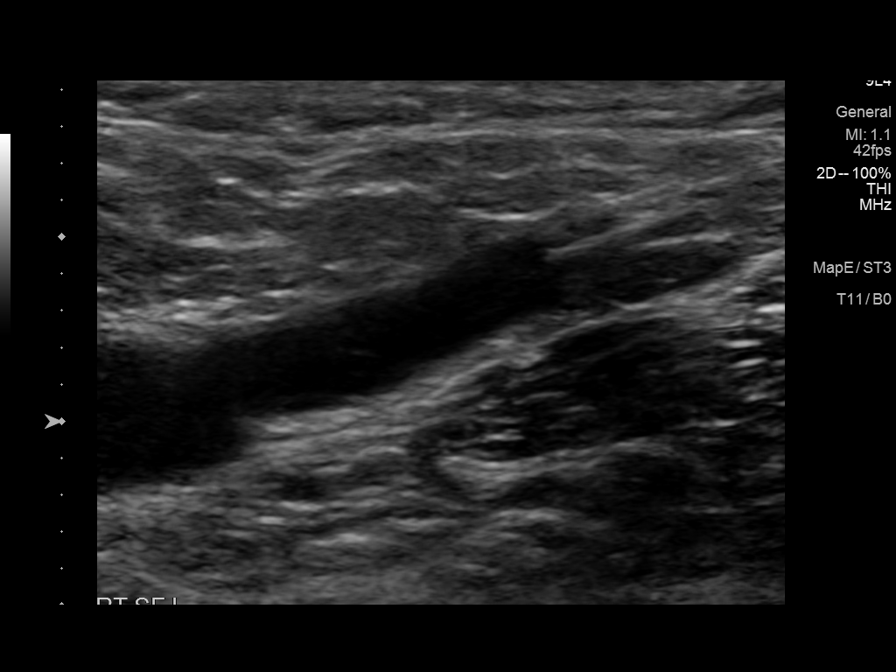
[im 3/7]
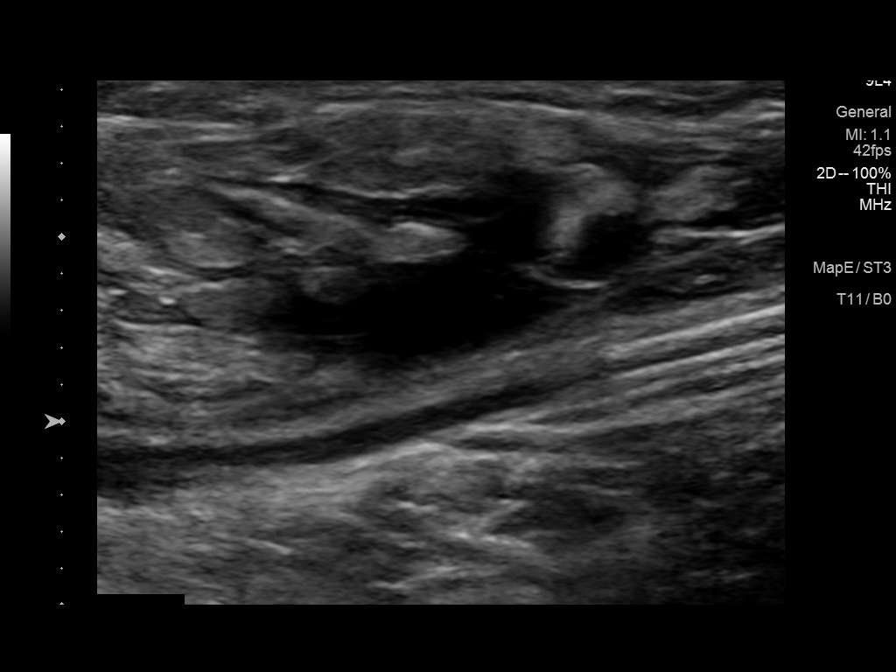
[im 4/7]
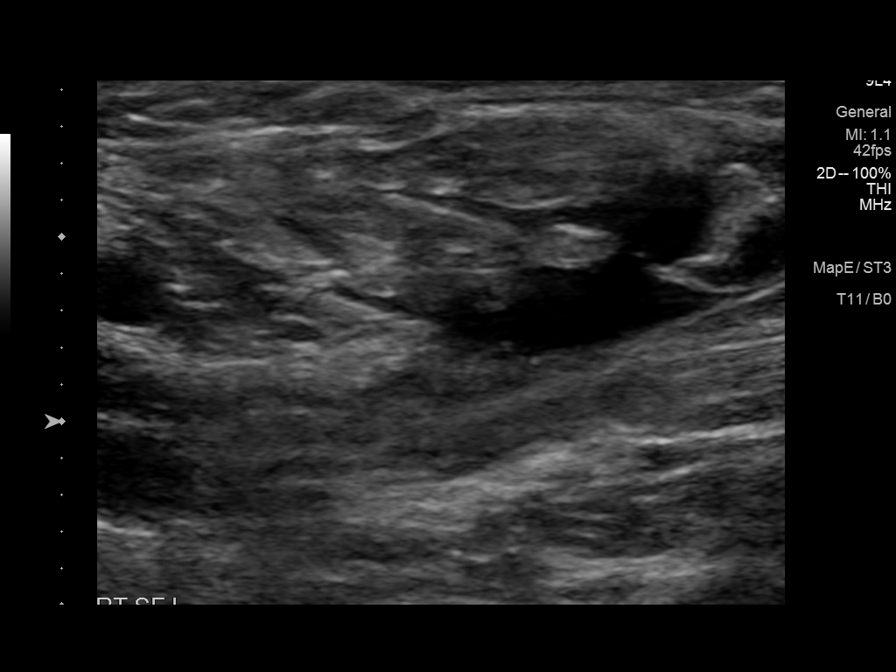
[im 5/7]
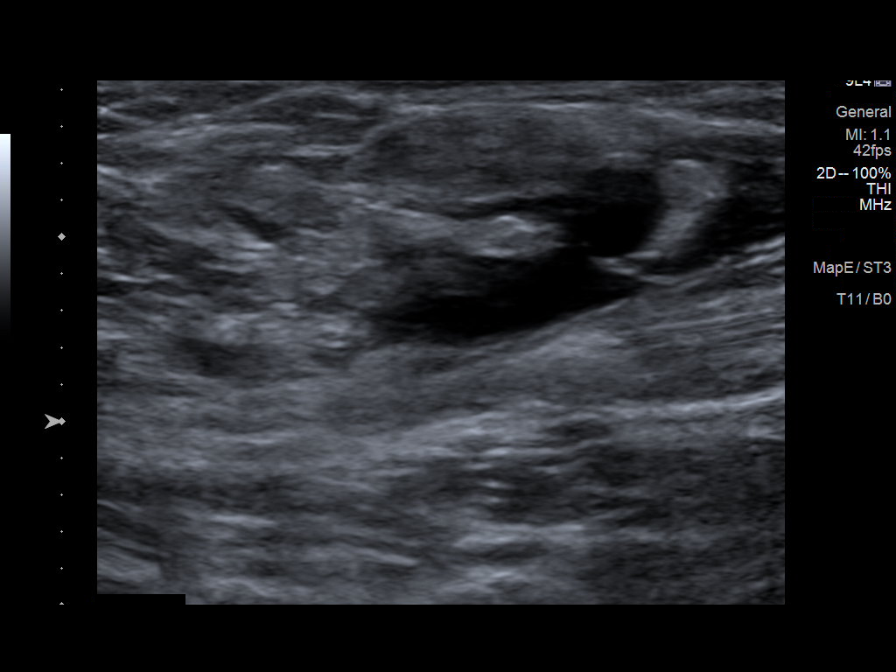
[im 5/7]
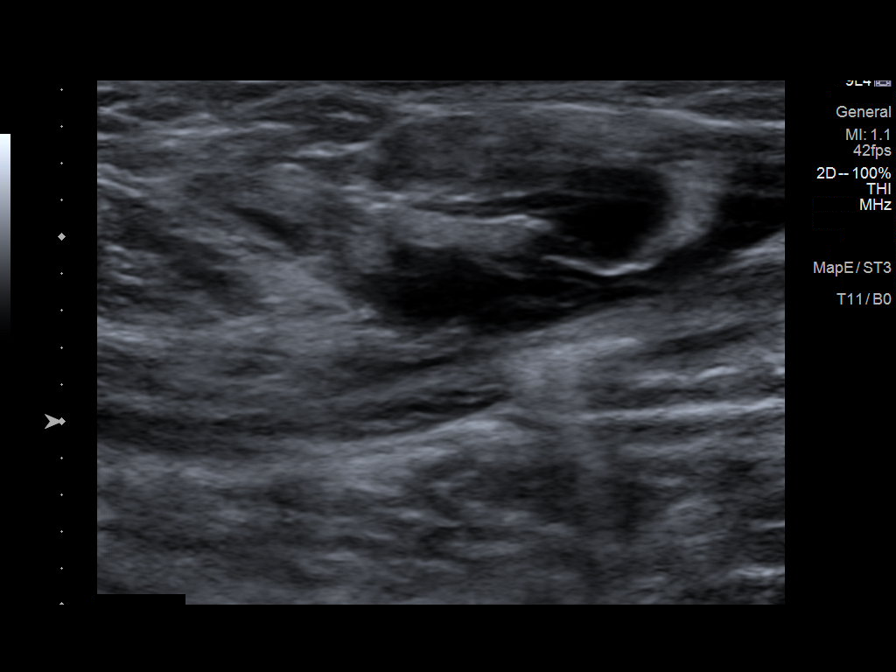
[im 6/7]
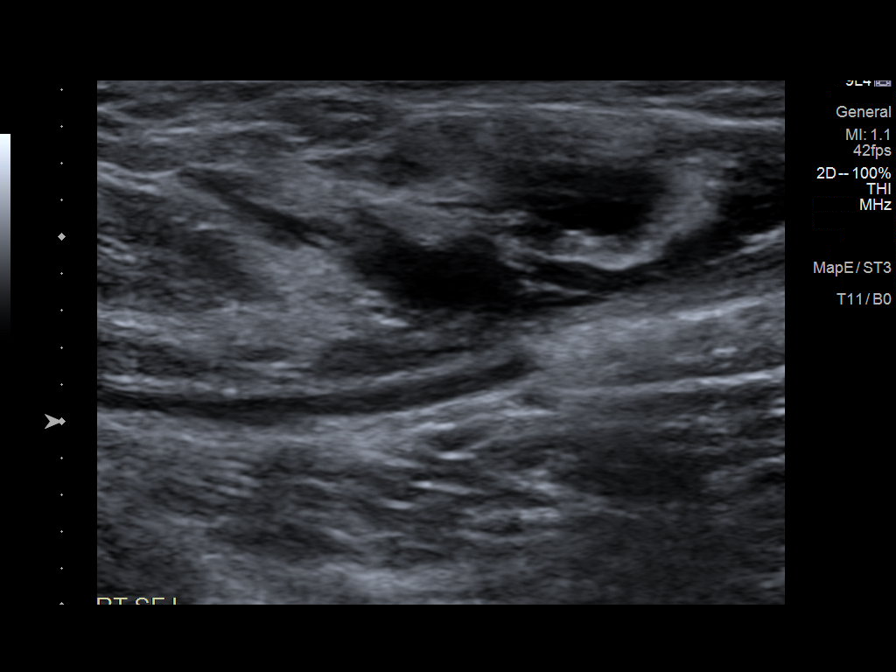
[im 6/7]
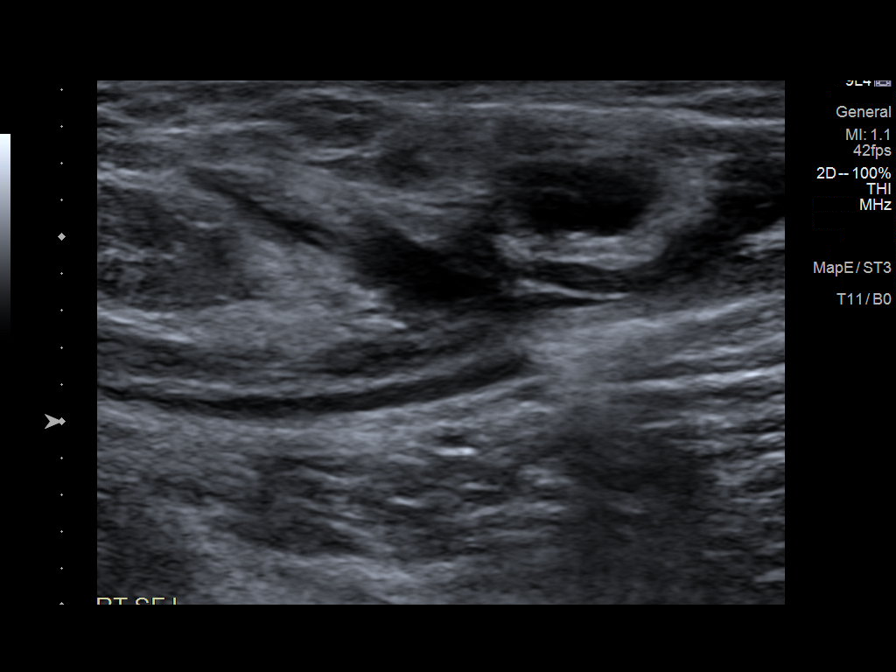
[im 7/7]
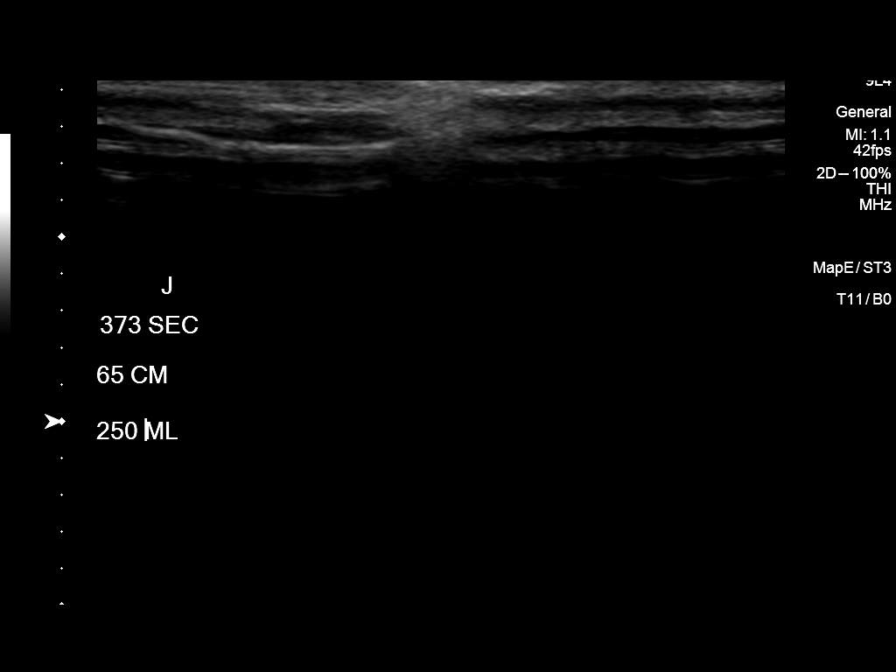

[9 of 9 positions shown; findings below may reference images not displayed]

IMPRESSION: 1. Technically successful transcatheter laser occlusion of right
greater saphenous vein. Patient will followup in clinic in one week.
problems.

## 2021-05-23 ENCOUNTER — Other Ambulatory Visit: Payer: Self-pay | Admitting: Interventional Radiology

## 2021-05-23 ENCOUNTER — Ambulatory Visit
Admission: RE | Admit: 2021-05-23 | Discharge: 2021-05-23 | Disposition: A | Discharge status: Home / Self Care | Payer: Managed Care, Other (non HMO) | Source: Ambulatory Visit | Attending: Interventional Radiology | Admitting: Interventional Radiology

## 2021-05-23 DIAGNOSIS — I872 Venous insufficiency (chronic) (peripheral): Secondary | ICD-10-CM

## 2021-05-23 IMAGING — US US EXTREM LOW VENOUS*R*
1 series · 13 of 24 positions shown · non-contrast
Comparison: [DATE]

CLINICAL DATA: 35-year-old with symptomatic right lower extremity
venous insufficiency. Transcatheter laser occlusion of the right
great saphenous vein with ultrasound guidance on [DATE].



[Series 1: us extrem low venous*right* · 0.05mm/px · 13 of 36 slices shown]
[im 1/36]
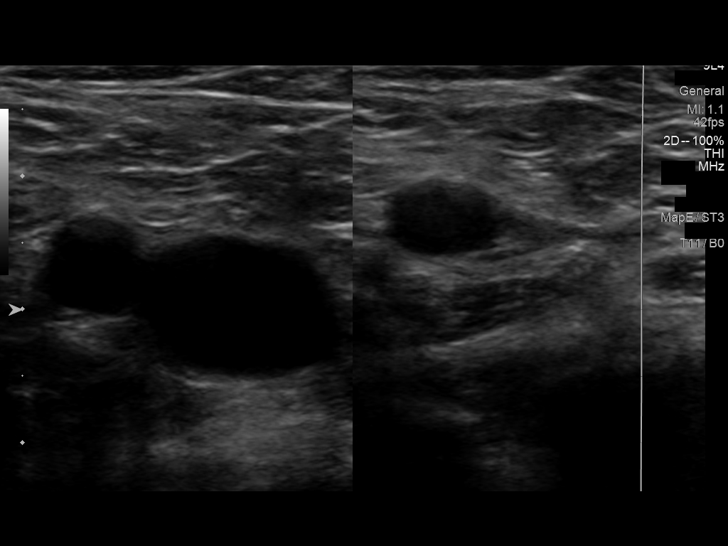
[im 4/36]
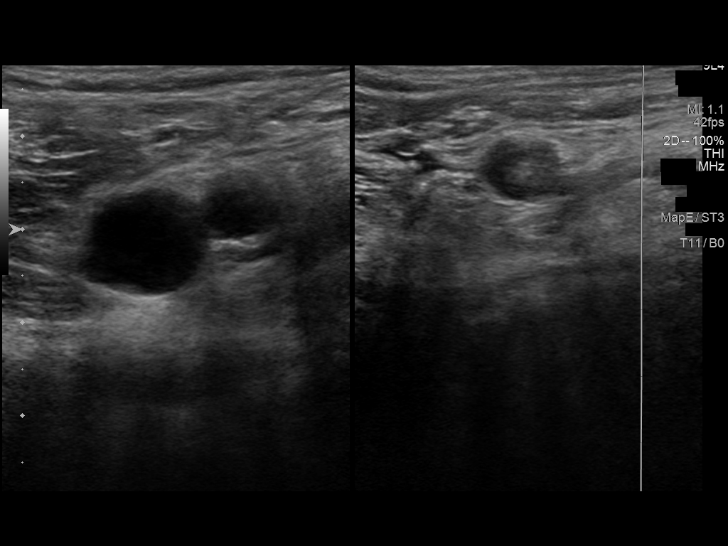
[im 7/36]
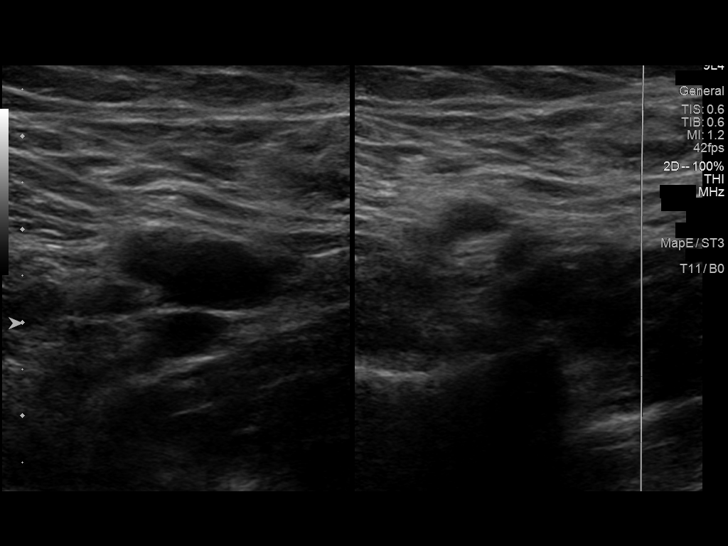
[im 10/36]
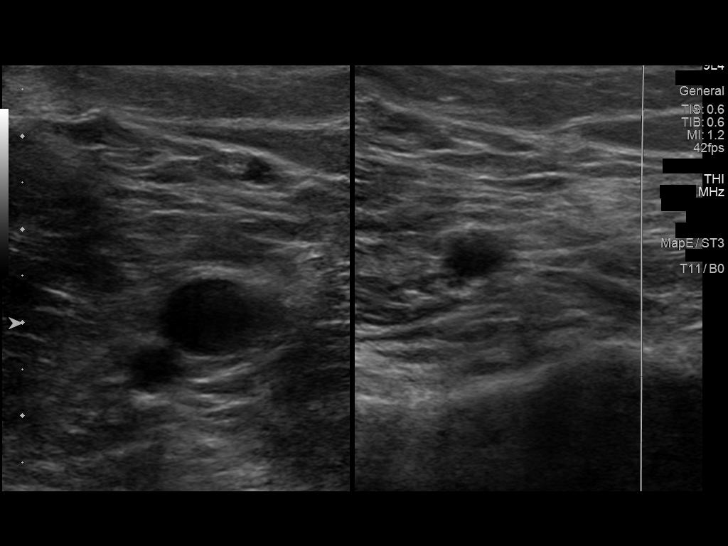
[im 13/36]
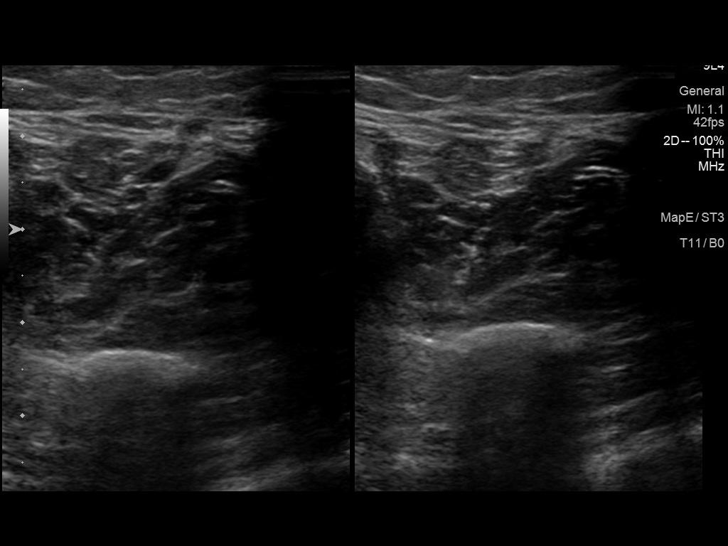
[im 16/36]
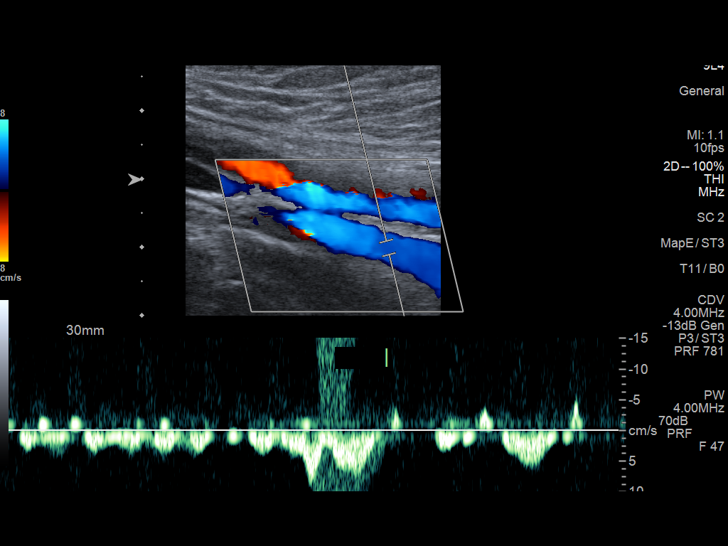
[im 19/36]
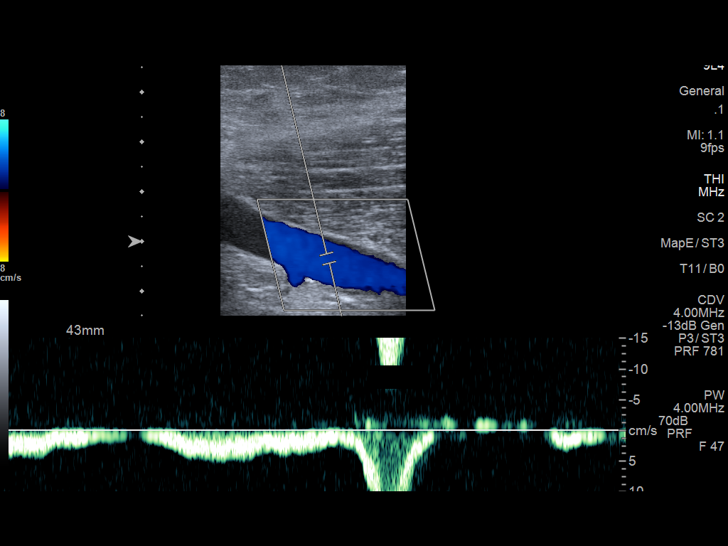
[im 20/36]
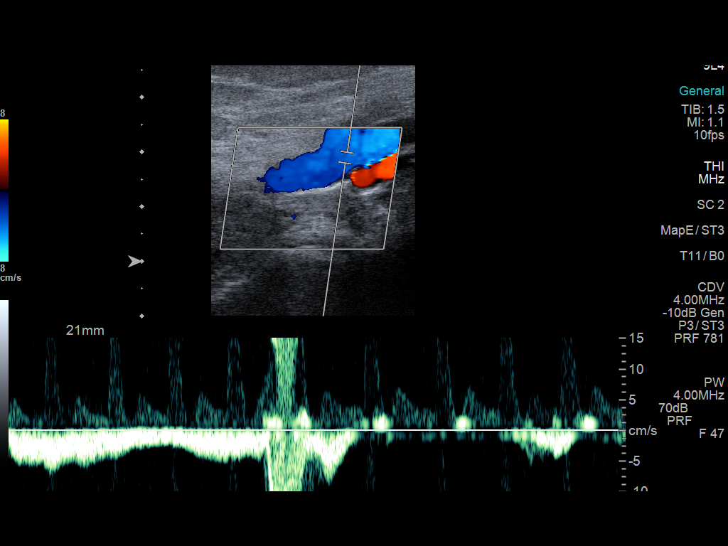
[im 23/36]
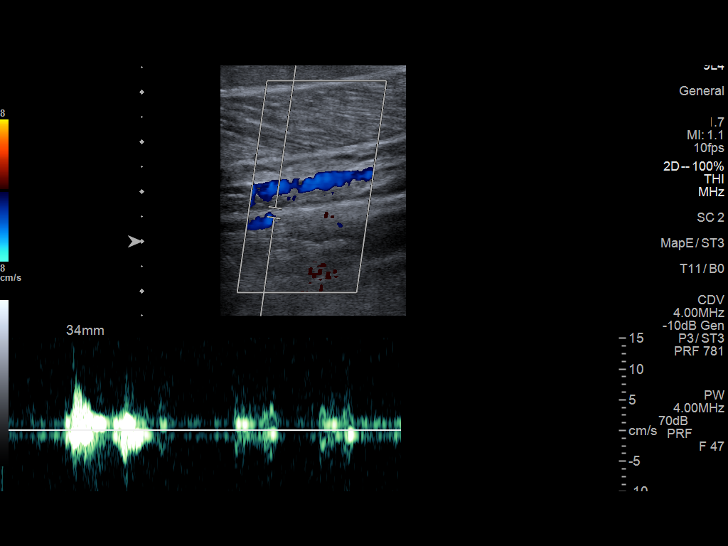
[im 26/36]
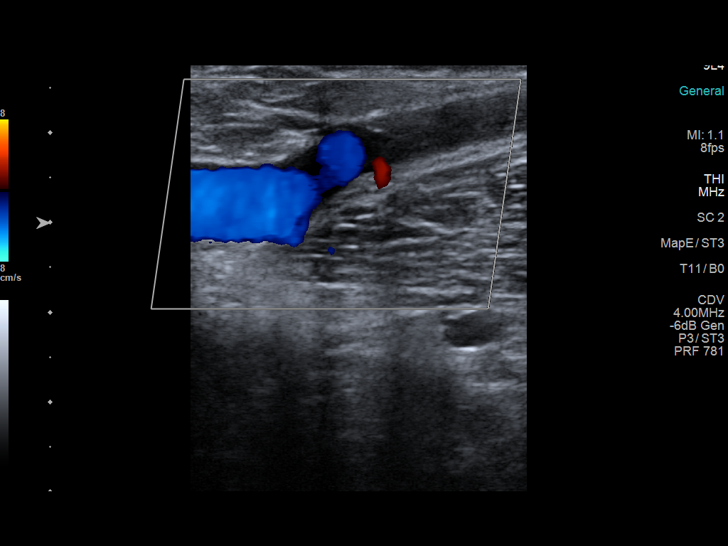
[im 29/36]
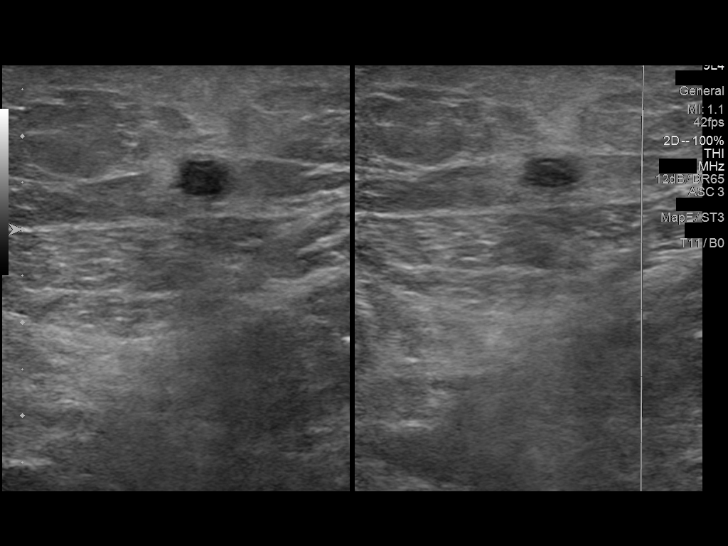
[im 32/36]
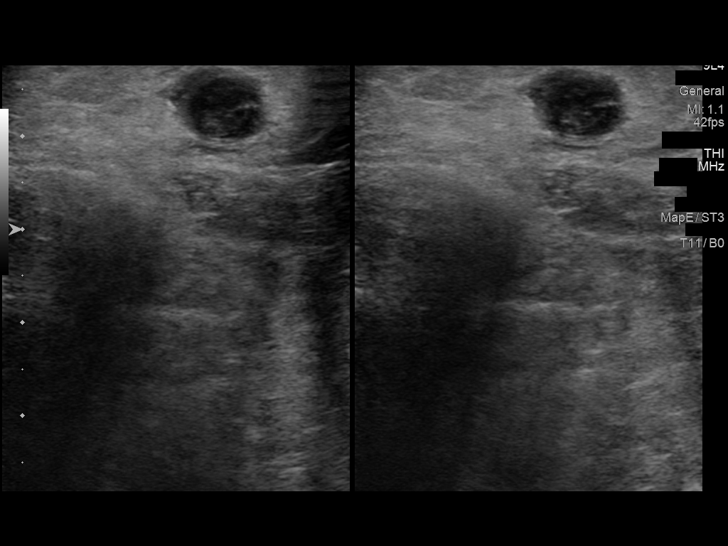
[im 36/36]
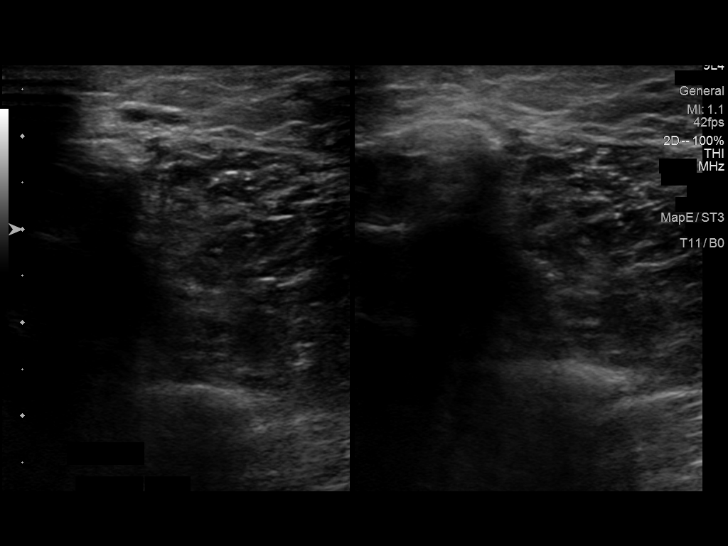

[13 of 24 positions shown; findings below may reference images not displayed]

FINDINGS: Contralateral Common Femoral Vein: Respiratory phasicity is normal
and symmetric with the symptomatic side. No evidence of thrombus.
Normal compressibility.

Common Femoral Vein: No evidence of thrombus. Normal
compressibility, respiratory phasicity and response to augmentation.

Saphenofemoral Junction: No evidence of thrombus. Normal
compressibility and flow on color Doppler imaging.

Profunda Femoral Vein: No evidence of thrombus. Normal
compressibility and flow on color Doppler imaging.

Femoral Vein: No evidence of thrombus. Normal compressibility,
respiratory phasicity and response to augmentation.

Popliteal Vein: No evidence of thrombus. Normal compressibility,
respiratory phasicity and response to augmentation.

Calf Veins: No evidence of thrombus. Normal compressibility and flow
on color Doppler imaging.

Superficial Great Saphenous Vein: Great saphenous vein is occluded
approximately 1 cm beyond the origin. The treated great saphenous
vein in the right thigh down to the mid calf is noncompressible and
occluded. Great saphenous vein distal to the puncture site in the
distal calf is patent.

Other Findings: There are compressible patent varicosities in the
mid calf. Patient has focal tenderness associated with a varicose
vein that contains thrombus and communicates with the occluded great
saphenous vein.
IMPRESSION: 1. Occlusion of the treated right great saphenous vein.
2. Varicosities in the right mid calf. Majority of these
varicosities are patent but there is thrombus associated with a
varicose vein at the connection with the great saphenous vein. This
varicosity with thrombus corresponds with patient's area of
tenderness.
3.  Negative for deep venous thrombosis in right lower extremity.

## 2021-05-23 NOTE — Progress Notes (Signed)
Chief Complaint: Patient was seen in consultation today for follow-up of right GSV laser occlusion  Referring Physician(s): Shick,Michael  History of Present Illness: Penny Hansen is a 36 y.o. female with symptomatic right lower extremity superficial venous insufficiency.  Patient underwent transcatheter laser occlusion of the right great saphenous vein on 05/16/2021.  She has been wearing the compression stocking all the time and slept in it the first 2 nights.  She had significant bruising in the right groin immediately following the procedure but says that the swelling has decreased and she has no significant pain in this area.   She recently started having point tenderness in the medial aspect of the right calf.  There is mild redness in this area.  She denies any other significant right leg pain.  No right leg swelling.  Past Medical History:  Diagnosis Date   Asthma    GERD (gastroesophageal reflux disease)    silent GERD which has caused cough in the past    Past Surgical History:  Procedure Laterality Date   IR EMBO VENOUS NOT HEMORR HEMANG  INC GUIDE ROADMAPPING  05/16/2021   TONSILECTOMY/ADENOIDECTOMY WITH MYRINGOTOMY  2012   WISDOM TOOTH EXTRACTION  2010    Allergies: Amoxicillin-pot clavulanate  Medications: Prior to Admission medications   Medication Sig Start Date End Date Taking? Authorizing Provider  albuterol (VENTOLIN HFA) 108 (90 Base) MCG/ACT inhaler Inhale 2 puffs into the lungs every 6 (six) hours as needed for wheezing or shortness of breath. 10/30/20   Pleas Koch, NP  Armodafinil 200 MG TABS One po q AM 02/13/21   Sater, Nanine Means, MD  Ascorbic Acid (VITAMIN C) 100 MG tablet Take 100 mg by mouth as directed.    [provider]  Cholecalciferol (VITAMIN D-3) 125 MCG (5000 UT) TABS  10/18/19   [provider]  Glatiramer Acetate 40 MG/ML SOSY Inject 1 Dose into the skin 3 (three) times a week. 02/13/21   Sater, Nanine Means, MD   guaiFENesin (MUCINEX) 600 MG 12 hr tablet Take by mouth 2 (two) times daily.    [provider]  hydrOXYzine (ATARAX/VISTARIL) 10 MG tablet Take 1-2 tablets (10-20 mg total) by mouth 2 (two) times daily as needed for anxiety. 01/30/21   Pleas Koch, NP  Multiple Vitamins-Minerals (MULTIVITAMIN GUMMIES WOMENS) CHEW Chew by mouth as directed.    [provider]  ondansetron (ZOFRAN ODT) 4 MG disintegrating tablet Take 1 tablet (4 mg total) by mouth every 8 (eight) hours as needed for nausea or vomiting. 01/30/21   Pleas Koch, NP     Family History  Problem Relation Age of Onset   Ulcerative colitis Mother        Living   Breast cancer Mother    Breast cancer Paternal Grandmother 22       breast cancer with mastectomy, chemo and radiation   Diabetes Paternal Grandmother    Cancer Paternal Grandmother        Breast   Hyperlipidemia Father        Living   Breast cancer Sister    Breast cancer Maternal Grandmother 8       breast cancer: lumpectomy   Cancer Maternal Grandmother        Breast   Heart disease Maternal Grandfather        MI; dissecting aneurysm   Heart attack Maternal Grandfather    Hypertension Maternal Grandfather    Prostate cancer Paternal Grandfather  Healthy Sister        x2    Social History   Socioeconomic History   Marital status: Significant Other    Spouse name: Not on file   Number of children: 0   Years of education: Bachelors   Highest education level: Not on file  Occupational History   Occupation: [employer]  Tobacco Use   Smoking status: Never   Smokeless tobacco: Never  Vaping Use   Vaping Use: Never used  Substance and Sexual Activity   Alcohol use: Yes    Alcohol/week: 0.0 standard drinks    Comment: rare   Drug use: No   Sexual activity: Yes    Birth control/protection: Pill  Other Topics Concern   Not on file  Social History Narrative   Works as Research officer, political party at [employer]    Married   No children   Moved to [place] in 2007 from [place]   Enjoys relaxing.    Right handed    Caffeine use: Coffee every day   Tea very rare   Social Determinants of Health   Financial Resource Strain: Not on file  Food Insecurity: Not on file  Transportation Needs: Not on file  Physical Activity: Not on file  Stress: Not on file  Social Connections: Not on file      Review of Systems  Cardiovascular:  Negative for leg swelling.  Musculoskeletal:        Point tenderness in the medial right calf.   Vital Signs: There were no vitals taken for this visit.  Physical Exam Constitutional:      Appearance: Normal appearance. She is not ill-appearing.  Pulmonary:     Effort: Pulmonary effort is normal.  Musculoskeletal:        General: Tenderness present. No swelling.     Comments: Puncture site in right calf is healing well. No significant swelling the right lower extremity. Mild redness in the right calf proximal to the puncture site.  This area of redness corresponds with thrombosis of a varicose vein based on ultrasound.  Large area of bruising in the right groin and medial proximal thigh.  Neurological:     Mental Status: She is alert.      Imaging: US Venous Img Lower Unilateral Right (DVT)  Result Date: 05/23/2021 CLINICAL DATA:  36 year old with symptomatic right lower extremity venous insufficiency. Transcatheter laser occlusion of the right great saphenous vein with ultrasound guidance on 05/16/2021. EXAM: RIGHT LOWER EXTREMITY VENOUS DOPPLER ULTRASOUND TECHNIQUE: Gray-scale sonography with graded compression, as well as color Doppler and duplex ultrasound were performed to evaluate the lower extremity deep venous systems from the level of the common femoral vein and including the common femoral, femoral, profunda femoral, popliteal and calf veins including the posterior tibial, peroneal and gastrocnemius veins when visible. The superficial great saphenous  vein was also interrogated. Spectral Doppler was utilized to evaluate flow at rest and with distal augmentation maneuvers in the common femoral, femoral and popliteal veins. COMPARISON:  03/07/2021 FINDINGS: Contralateral Common Femoral Vein: Respiratory phasicity is normal and symmetric with the symptomatic side. No evidence of thrombus. Normal compressibility. Common Femoral Vein: No evidence of thrombus. Normal compressibility, respiratory phasicity and response to augmentation. Saphenofemoral Junction: No evidence of thrombus. Normal compressibility and flow on color Doppler imaging. Profunda Femoral Vein: No evidence of thrombus. Normal compressibility and flow on color Doppler imaging. Femoral Vein: No evidence of thrombus. Normal compressibility, respiratory phasicity and response to augmentation. Popliteal Vein: No evidence of  thrombus. Normal compressibility, respiratory phasicity and response to augmentation. Calf Veins: No evidence of thrombus. Normal compressibility and flow on color Doppler imaging. Superficial Great Saphenous Vein: Great saphenous vein is occluded approximately 1 cm beyond the origin. The treated great saphenous vein in the right thigh down to the mid calf is noncompressible and occluded. Great saphenous vein distal to the puncture site in the distal calf is patent. Other Findings: There are compressible patent varicosities in the mid calf. Patient has focal tenderness associated with a varicose vein that contains thrombus and communicates with the occluded great saphenous vein. IMPRESSION: 1. Occlusion of the treated right great saphenous vein. 2. Varicosities in the right mid calf. Majority of these varicosities are patent but there is thrombus associated with a varicose vein at the connection with the great saphenous vein. This varicosity with thrombus corresponds with patient's area of tenderness. 3.  Negative for deep venous thrombosis in right lower extremity. Electronically  Signed   By: Markus Daft M.D.   On: 05/23/2021 14:35   [place] RAD EVAL AND MGMT  Result Date: 05/23/2021 Please refer to "Notes" to see consult details.  IR EMBO VENOUS NOT HEMORR HEMANG  INC GUIDE ROADMAPPING  Result Date: 05/16/2021 CLINICAL DATA:  Symptomatic lower extremity varicose veins. Diffuse right GSV venous insufficiency with enlarging symptomatic right calf varicosities. Progressive right calf and lower extremity fatigue, fullness, and pain EXAM: TRANSCATHETER LASER OCCLUSION RIGHT GREATER SAPHENOUS VEIN WITH ULTRASOUND GUIDANCE: TECHNIQUE: Survey ultrasound of the leg was performed and the course of the greater saphenous vein was marked on the skin. Overlying skin prepped with chloraprep, draped in usual sterile fashion. After local anesthetic using 1% lidocaine, the greater saphenous vein was accessed at the high ankle level under ultrasound with a 21-gauge micropuncture needle. A 018 guidewire advanced easily. This was exchanged using a transitional dilator for a 035 J wire. Over this, the 6 French delivery sheath was advanced beyond the saphenofemoral junction. The laser fiber was advanced and positioned 2 cm from the saphenofemoral junction. Tumescent dilute 0.1% lidocaine 261mL used along the planned treatment length. Ultrasound was used to confirm appropriate tumescent anesthesia and to verify that all segments were at least 1 cm from the skin surface. The laser fiber was activated while being withdrawn over the treatment length, delivering 2,240joules over 373seconds. The catheter and sheath were removed and hemostasis easily achieved at the site. Patient was placed in graduated compression stockings and ambulated for 20 minutes without difficulty. Patient tolerated the procedure well, with no immediate complication. IMPRESSION: 1. Technically successful transcatheter laser occlusion of right greater saphenous vein. Patient will followup in clinic in one week. Patient knows to telephone should  there be any interval questions or problems. Electronically Signed   By: Jerilynn Mages.  Shick M.D.   On: 05/16/2021 10:30    Labs:  CBC: No results for input(s): WBC, HGB, HCT, PLT in the last 8760 hours.  COAGS: No results for input(s): INR, APTT in the last 8760 hours.  BMP: No results for input(s): NA, K, CL, CO2, GLUCOSE, BUN, CALCIUM, CREATININE, GFRNONAA, GFRAA in the last 8760 hours.  Invalid input(s): CMP  LIVER FUNCTION TESTS: No results for input(s): BILITOT, AST, ALT, ALKPHOS, PROT, ALBUMIN in the last 8760 hours.  TUMOR MARKERS: No results for input(s): AFPTM, CEA, CA199, CHROMGRNA in the last 8760 hours.  Assessment and Plan:  36 year old with symptomatic right lower extremity superficial venous insufficiency.  Patient underwent transcatheter laser occlusion of the right great saphenous vein  on 05/16/2021.  Patient has done well following the procedure although she developed bruising in the right groin which appears to be resolving and this area is asymptomatic.  The treated right great saphenous vein is occluded based on today's ultrasound.  She has focal tenderness in the right calf proximal to the puncture site and this corresponds with thrombosis of a varicose vein that communicates with the occluded great saphenous vein.  Findings are suggestive for thrombophlebitis in this region.  Recommend nonsteroidal anti-inflammatory medications for the pain relief.  Explained that thrombosis of these varicose veins is an expected and ultimately desired outcome from the treatment.  I recommend the patient continue to wear the compression stocking during the day and she is scheduled to see Dr. Annamaria Boots in 3 to 4 weeks for follow-up evaluation of the treated great saphenous vein and the residual patent varicose veins in the right calf.  Patient may need ultrasound-guided foam sclerotherapy to these residual varicose veins at that time.   Electronically Signed: Burman Riis 05/23/2021, 3:13 PM   I  spent a total of    10 Minutes in face to face in clinical consultation, greater than 50% of which was counseling/coordinating care for right lower extremity venous insufficiency.  Patient ID: Penny Hansen, female   DOB: 1986/03/29, 36 y.o.   MRN: [id number]

## 2021-06-10 ENCOUNTER — Ambulatory Visit
Admission: RE | Admit: 2021-06-10 | Discharge: 2021-06-10 | Disposition: A | Discharge status: Home / Self Care | Payer: Managed Care, Other (non HMO) | Source: Ambulatory Visit | Attending: Interventional Radiology | Admitting: Interventional Radiology

## 2021-06-10 DIAGNOSIS — I872 Venous insufficiency (chronic) (peripheral): Secondary | ICD-10-CM

## 2021-06-10 IMAGING — US US EXTREM LOW VENOUS*R*
1 series · 13 of 24 positions shown · non-contrast
Comparison: None.

CLINICAL DATA: One month status post right GSV transcatheter laser
occlusion



[Series 1: us extrem low venous*right* · 0.05mm/px · 13 of 35 slices shown]
[im 1/35]
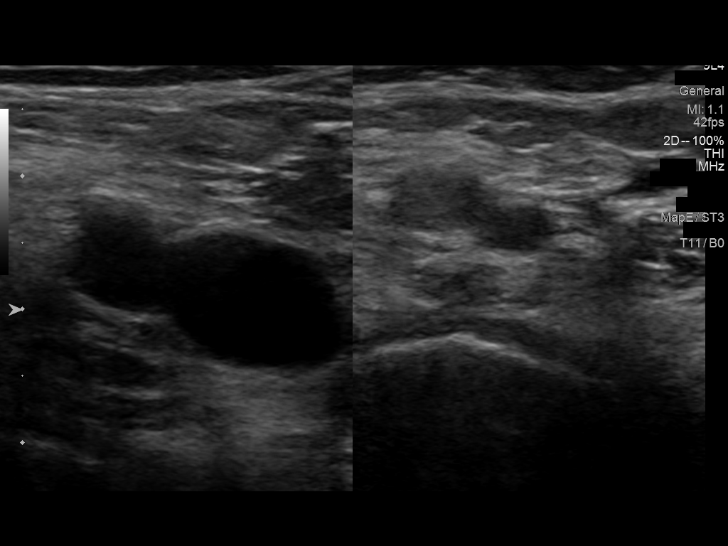
[im 3/35]
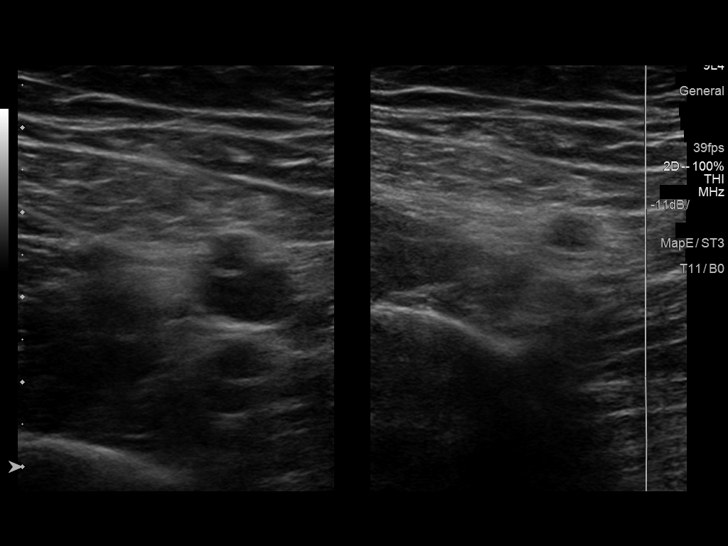
[im 6/35]
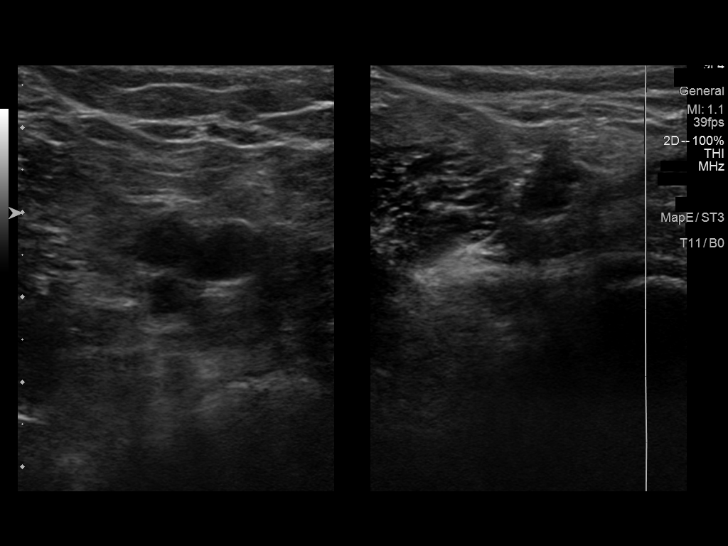
[im 9/35]
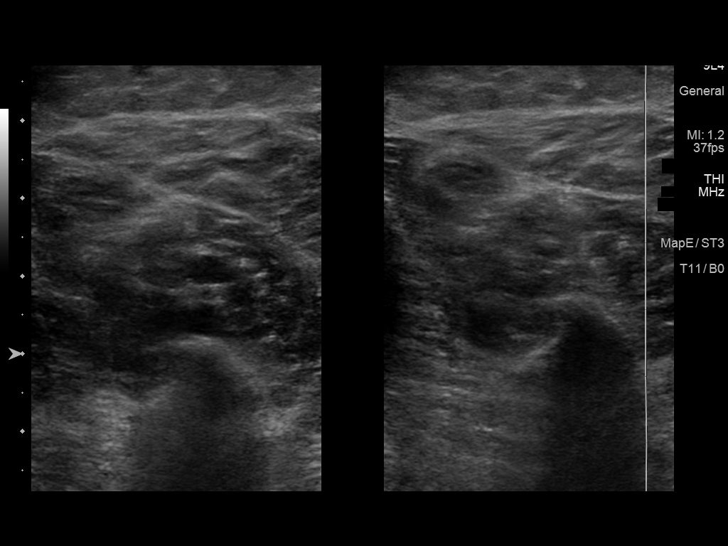
[im 12/35]
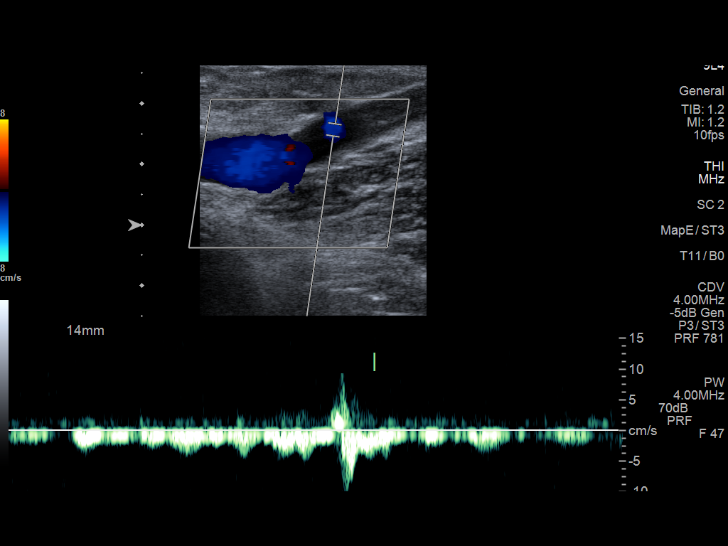
[im 15/35]
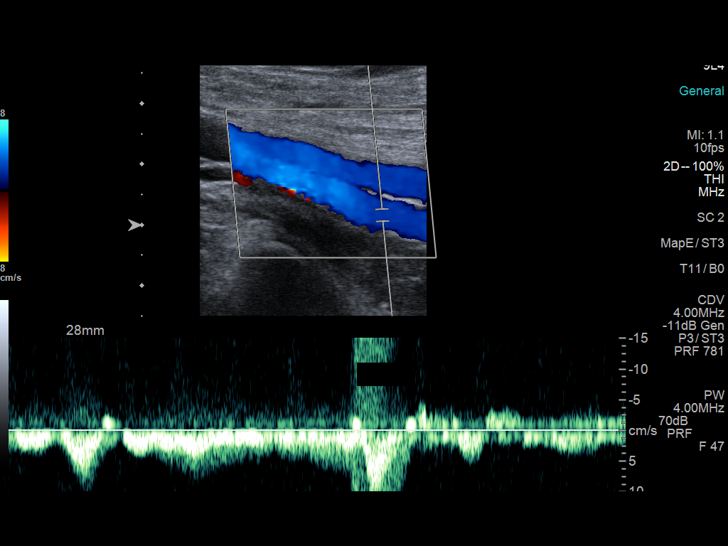
[im 18/35]
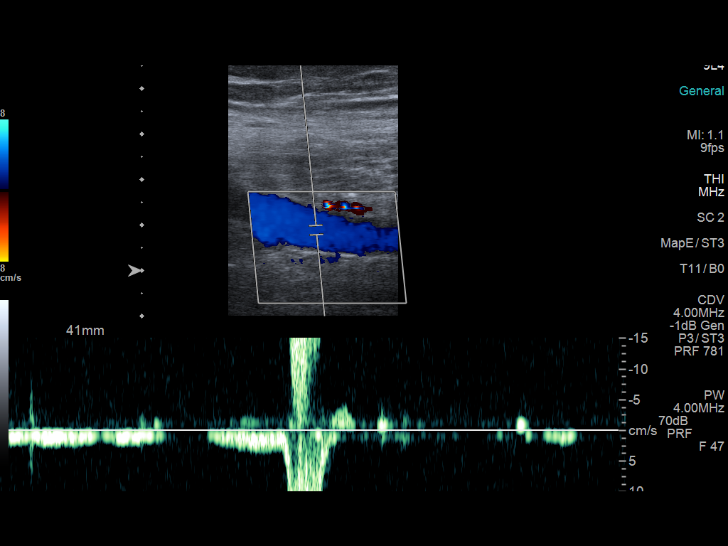
[im 20/35]
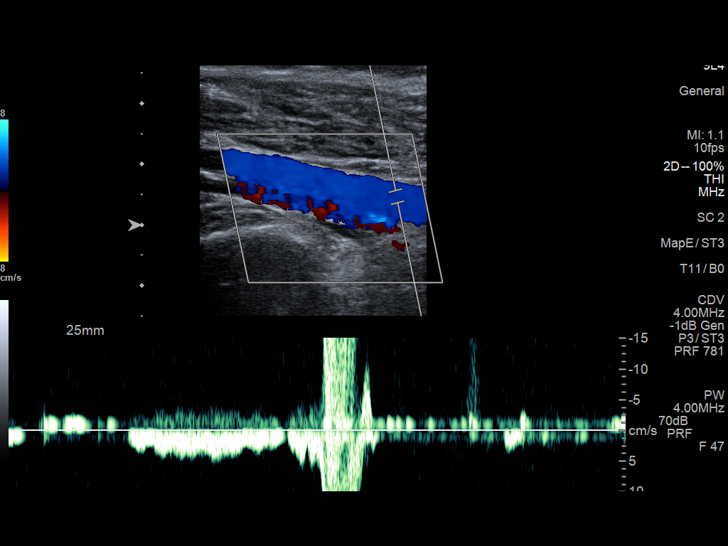
[im 23/35]
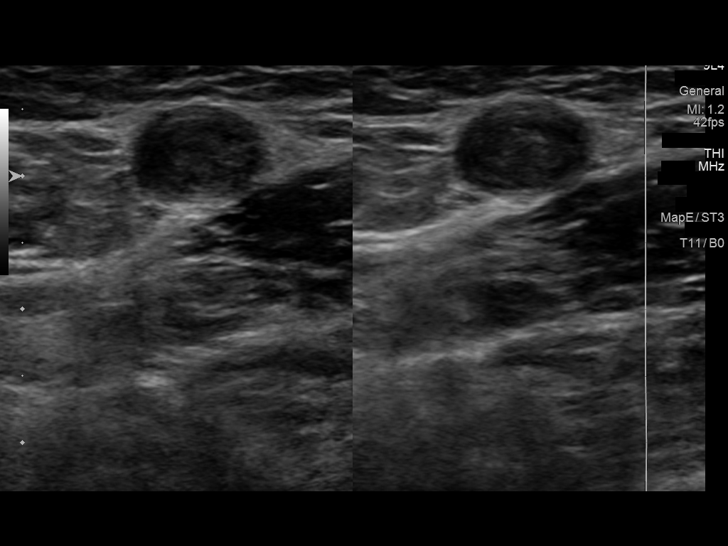
[im 26/35]
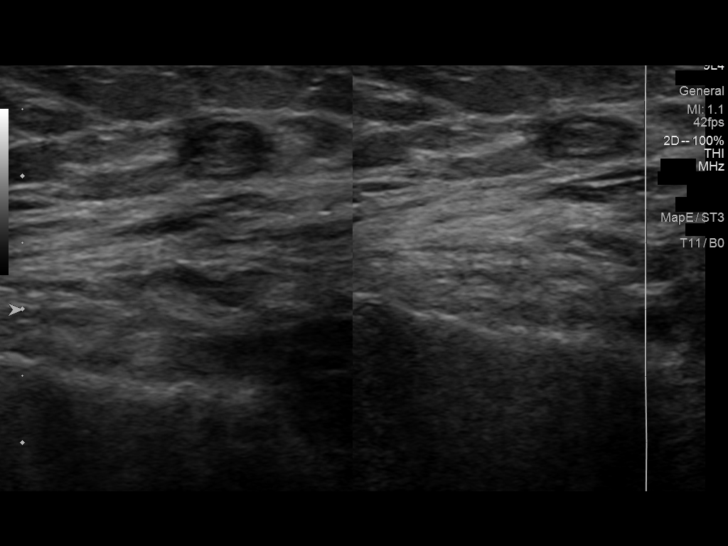
[im 29/35]
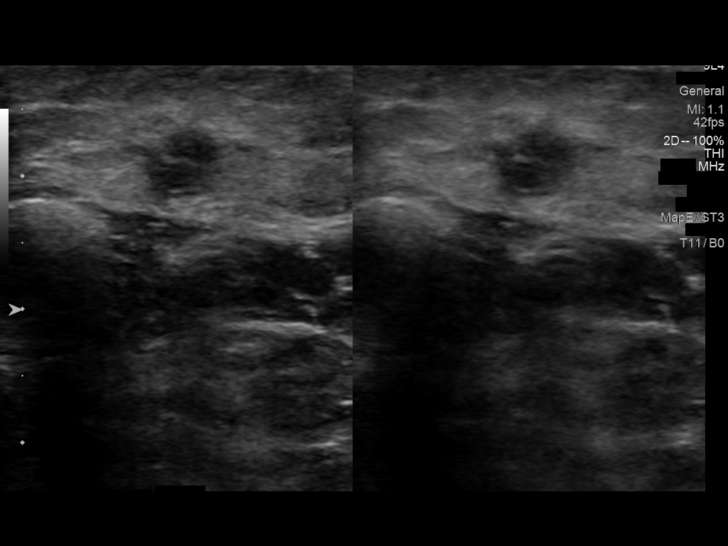
[im 32/35]
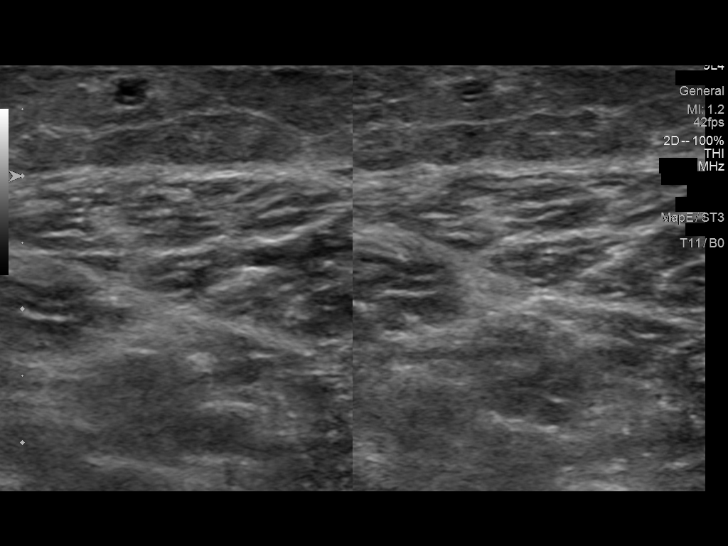
[im 35/35]
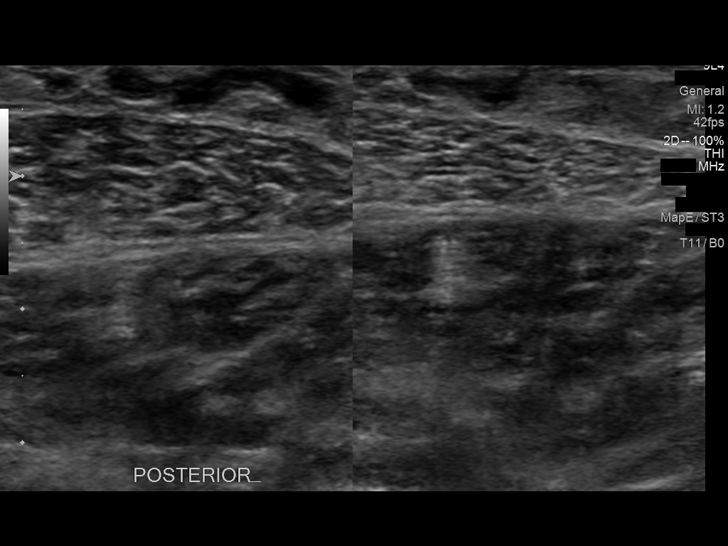

[13 of 24 positions shown; findings below may reference images not displayed]

FINDINGS: Contralateral Common Femoral Vein: Respiratory phasicity is normal
and symmetric with the symptomatic side. No evidence of thrombus.
Normal compressibility.

Common Femoral Vein: No evidence of thrombus. Normal
compressibility, respiratory phasicity and response to augmentation.

Saphenofemoral Junction: No evidence of thrombus. Normal
compressibility and flow on color Doppler imaging.

Profunda Femoral Vein: No evidence of thrombus. Normal
compressibility and flow on color Doppler imaging.

Femoral Vein: No evidence of thrombus. Normal compressibility,
respiratory phasicity and response to augmentation.

Popliteal Vein: No evidence of thrombus. Normal compressibility,
respiratory phasicity and response to augmentation.

Calf Veins: No evidence of thrombus. Normal compressibility and flow
on color Doppler imaging.

Superficial Great Saphenous Vein: Entire right GSV from the
saphenofemoral junction to the lower calf level remains occluded
following treatment. No early recanalization.

Venous Reflux:  None.

Other Findings: Right mid calf posterior superficial varicosities
are mostly thrombosed. Some areas are only partially thrombosed.
Varicose veins appear decompressed.
IMPRESSION: Negative for DVT.

Right GSV treated segment remains occluded

Right mid calf posterior superficial varicosities are mostly
thrombosed.

## 2021-06-10 NOTE — Progress Notes (Signed)
Patient ID: Penny Hansen, female   DOB: 1986/03/09, 36 y.o.   MRN: [id number]       Chief Complaint:  1 month status post right GSV transcatheter laser occlusion  Referring Physician(s): Doreene Nest  History of Present Illness: Penny Hansen is a 36 y.o. female who is now 1 month status post right GSV transcatheter laser occlusion for right GSV venous insufficiency and symptomatic right calf varicosities.  Over the last month she has been doing very well.  She has been compliant with thigh-high compression stockings.  Pain and bruising along the treated segment has resorbed/resolved.  No recent illness or fevers.  No signs of phlebitis or cellulitis.  No current varicose vein related leg pain or other symptoms.  She has been walking daily.  She is back to work.  Ultrasound today confirms occlusion of the right GSV treated segment.  Majority of the right posterior mid calf varicosities are thrombosed and decompressed.  Past Medical History:  Diagnosis Date   Asthma    GERD (gastroesophageal reflux disease)    silent GERD which has caused cough in the past    Past Surgical History:  Procedure Laterality Date   IR EMBO VENOUS NOT HEMORR HEMANG  INC GUIDE ROADMAPPING  05/16/2021   TONSILECTOMY/ADENOIDECTOMY WITH MYRINGOTOMY  2012   WISDOM TOOTH EXTRACTION  2010    Allergies: Amoxicillin-pot clavulanate  Medications: Prior to Admission medications   Medication Sig Start Date End Date Taking? Authorizing Provider  albuterol (VENTOLIN HFA) 108 (90 Base) MCG/ACT inhaler Inhale 2 puffs into the lungs every 6 (six) hours as needed for wheezing or shortness of breath. 10/30/20   Doreene Nest, NP  Armodafinil 200 MG TABS One po q AM 02/13/21   Sater, Pearletha Furl, MD  Ascorbic Acid (VITAMIN C) 100 MG tablet Take 100 mg by mouth as directed.    [provider]  Cholecalciferol (VITAMIN D-3) 125 MCG (5000 UT) TABS  10/18/19   [provider]  Glatiramer  Acetate 40 MG/ML SOSY Inject 1 Dose into the skin 3 (three) times a week. 02/13/21   Sater, Pearletha Furl, MD  guaiFENesin (MUCINEX) 600 MG 12 hr tablet Take by mouth 2 (two) times daily.    [provider]  hydrOXYzine (ATARAX/VISTARIL) 10 MG tablet Take 1-2 tablets (10-20 mg total) by mouth 2 (two) times daily as needed for anxiety. 01/30/21   Doreene Nest, NP  Multiple Vitamins-Minerals (MULTIVITAMIN GUMMIES WOMENS) CHEW Chew by mouth as directed.    [provider]  ondansetron (ZOFRAN ODT) 4 MG disintegrating tablet Take 1 tablet (4 mg total) by mouth every 8 (eight) hours as needed for nausea or vomiting. 01/30/21   Doreene Nest, NP     Family History  Problem Relation Age of Onset   Ulcerative colitis Mother        Living   Breast cancer Mother    Breast cancer Paternal Grandmother 51       breast cancer with mastectomy, chemo and radiation   Diabetes Paternal Grandmother    Cancer Paternal Grandmother        Breast   Hyperlipidemia Father        Living   Breast cancer Sister    Breast cancer Maternal Grandmother 2       breast cancer: lumpectomy   Cancer Maternal Grandmother        Breast   Heart disease Maternal Grandfather        MI; dissecting  aneurysm   Heart attack Maternal Grandfather    Hypertension Maternal Grandfather    Prostate cancer Paternal Grandfather    Healthy Sister        x2    Social History   Socioeconomic History   Marital status: Significant Other    Spouse name: Not on file   Number of children: 0   Years of education: Bachelors   Highest education level: Not on file  Occupational History   Occupation: [employer]  Tobacco Use   Smoking status: Never   Smokeless tobacco: Never  Vaping Use   Vaping Use: Never used  Substance and Sexual Activity   Alcohol use: Yes    Alcohol/week: 0.0 standard drinks    Comment: rare   Drug use: No   Sexual activity: Yes    Birth control/protection: Pill  Other  Topics Concern   Not on file  Social History Narrative   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    Right handed    Caffeine use: Coffee every day   Tea very rare   Social Determinants of Health   Financial Resource Strain: Not on file  Food Insecurity: Not on file  Transportation Needs: Not on file  Physical Activity: Not on file  Stress: Not on file  Social Connections: Not on file      Review of Systems: A 12 point ROS discussed and pertinent positives are indicated in the HPI above.  All other systems are negative.  Review of Systems  Vital Signs: There were no vitals taken for this visit.  Physical Exam Constitutional:      General: She is not in acute distress.    Appearance: She is normal weight. She is not toxic-appearing.  Eyes:     General: No scleral icterus.    Conjunctiva/sclera: Conjunctivae normal.  Musculoskeletal:        General: No swelling or deformity. Normal range of motion.     Right lower leg: No edema.     Comments: Proximal medial thigh bruising has resolved. Minimal hyperpigmentation in the medial mid to lower calf area related to the treatment.  Intact skin.  Negative for edema.  Skin:    General: Skin is warm and dry.  Neurological:     General: No focal deficit present.     Mental Status: She is alert. Mental status is at baseline.  Psychiatric:        Mood and Affect: Mood normal.        Thought Content: Thought content normal.          Imaging: US Venous Img Lower Unilateral Right (DVT)  Result Date: 06/10/2021 CLINICAL DATA:  One month status post right GSV transcatheter laser occlusion EXAM: RIGHT LOWER EXTREMITY VENOUS DOPPLER ULTRASOUND TECHNIQUE: Gray-scale sonography with graded compression, as well as color Doppler and duplex ultrasound were performed to evaluate the lower extremity deep venous systems from the level of the common  femoral vein and including the common femoral, femoral, profunda femoral, popliteal and calf veins including the posterior tibial, peroneal and gastrocnemius veins when visible. The superficial great saphenous vein was also interrogated. Spectral Doppler was utilized to evaluate flow at rest and with distal augmentation maneuvers in the common femoral, femoral and popliteal veins. COMPARISON:  None. FINDINGS: Contralateral Common Femoral Vein: Respiratory phasicity is normal and symmetric with the symptomatic side. No  evidence of thrombus. Normal compressibility. Common Femoral Vein: No evidence of thrombus. Normal compressibility, respiratory phasicity and response to augmentation. Saphenofemoral Junction: No evidence of thrombus. Normal compressibility and flow on color Doppler imaging. Profunda Femoral Vein: No evidence of thrombus. Normal compressibility and flow on color Doppler imaging. Femoral Vein: No evidence of thrombus. Normal compressibility, respiratory phasicity and response to augmentation. Popliteal Vein: No evidence of thrombus. Normal compressibility, respiratory phasicity and response to augmentation. Calf Veins: No evidence of thrombus. Normal compressibility and flow on color Doppler imaging. Superficial Great Saphenous Vein: Entire right GSV from the saphenofemoral junction to the lower calf level remains occluded following treatment. No early recanalization. Venous Reflux:  None. Other Findings: Right mid calf posterior superficial varicosities are mostly thrombosed. Some areas are only partially thrombosed. Varicose veins appear decompressed. IMPRESSION: Negative for DVT. Right GSV treated segment remains occluded Right mid calf posterior superficial varicosities are mostly thrombosed. Electronically Signed   By: Judie Petit.  Yuvonne Lanahan M.D.   On: 06/10/2021 13:57   US Venous Img Lower Unilateral Right (DVT)  Result Date: 05/23/2021 CLINICAL DATA:  36 year old with symptomatic right lower extremity  venous insufficiency. Transcatheter laser occlusion of the right great saphenous vein with ultrasound guidance on 05/16/2021. EXAM: RIGHT LOWER EXTREMITY VENOUS DOPPLER ULTRASOUND TECHNIQUE: Gray-scale sonography with graded compression, as well as color Doppler and duplex ultrasound were performed to evaluate the lower extremity deep venous systems from the level of the common femoral vein and including the common femoral, femoral, profunda femoral, popliteal and calf veins including the posterior tibial, peroneal and gastrocnemius veins when visible. The superficial great saphenous vein was also interrogated. Spectral Doppler was utilized to evaluate flow at rest and with distal augmentation maneuvers in the common femoral, femoral and popliteal veins. COMPARISON:  03/07/2021 FINDINGS: Contralateral Common Femoral Vein: Respiratory phasicity is normal and symmetric with the symptomatic side. No evidence of thrombus. Normal compressibility. Common Femoral Vein: No evidence of thrombus. Normal compressibility, respiratory phasicity and response to augmentation. Saphenofemoral Junction: No evidence of thrombus. Normal compressibility and flow on color Doppler imaging. Profunda Femoral Vein: No evidence of thrombus. Normal compressibility and flow on color Doppler imaging. Femoral Vein: No evidence of thrombus. Normal compressibility, respiratory phasicity and response to augmentation. Popliteal Vein: No evidence of thrombus. Normal compressibility, respiratory phasicity and response to augmentation. Calf Veins: No evidence of thrombus. Normal compressibility and flow on color Doppler imaging. Superficial Great Saphenous Vein: Great saphenous vein is occluded approximately 1 cm beyond the origin. The treated great saphenous vein in the right thigh down to the mid calf is noncompressible and occluded. Great saphenous vein distal to the puncture site in the distal calf is patent. Other Findings: There are compressible  patent varicosities in the mid calf. Patient has focal tenderness associated with a varicose vein that contains thrombus and communicates with the occluded great saphenous vein. IMPRESSION: 1. Occlusion of the treated right great saphenous vein. 2. Varicosities in the right mid calf. Majority of these varicosities are patent but there is thrombus associated with a varicose vein at the connection with the great saphenous vein. This varicosity with thrombus corresponds with patient's area of tenderness. 3.  Negative for deep venous thrombosis in right lower extremity. Electronically Signed   By: Richarda Overlie M.D.   On: 05/23/2021 14:35   [place] RAD EVAL AND MGMT  Result Date: 06/10/2021 Please refer to "Notes" to see consult details.  [place] RAD EVAL AND MGMT  Result Date: 05/23/2021 Please refer to "Notes" to  see consult details.  IR EMBO VENOUS NOT HEMORR HEMANG  INC GUIDE ROADMAPPING  Result Date: 05/16/2021 CLINICAL DATA:  Symptomatic lower extremity varicose veins. Diffuse right GSV venous insufficiency with enlarging symptomatic right calf varicosities. Progressive right calf and lower extremity fatigue, fullness, and pain EXAM: TRANSCATHETER LASER OCCLUSION RIGHT GREATER SAPHENOUS VEIN WITH ULTRASOUND GUIDANCE: TECHNIQUE: Survey ultrasound of the leg was performed and the course of the greater saphenous vein was marked on the skin. Overlying skin prepped with chloraprep, draped in usual sterile fashion. After local anesthetic using 1% lidocaine, the greater saphenous vein was accessed at the high ankle level under ultrasound with a 21-gauge micropuncture needle. A 018 guidewire advanced easily. This was exchanged using a transitional dilator for a 035 J wire. Over this, the 6 French delivery sheath was advanced beyond the saphenofemoral junction. The laser fiber was advanced and positioned 2 cm from the saphenofemoral junction. Tumescent dilute 0.1% lidocaine used along the planned treatment length.  Ultrasound was used to confirm appropriate tumescent anesthesia and to verify that all segments were at least 1 cm from the skin surface. The laser fiber was activated while being withdrawn over the treatment length, delivering 2,240joules over 373seconds. The catheter and sheath were removed and hemostasis easily achieved at the site. Patient was placed in graduated compression stockings and ambulated for 20 minutes without difficulty. Patient tolerated the procedure well, with no immediate complication. IMPRESSION: 1. Technically successful transcatheter laser occlusion of right greater saphenous vein. Patient will followup in clinic in one week. Patient knows to telephone should there be any interval questions or problems. Electronically Signed   By: Judie Petit.  Tiani Stanbery M.D.   On: 05/16/2021 10:30    Labs:  CBC: No results for input(s): WBC, HGB, HCT, PLT in the last 8760 hours.  COAGS: No results for input(s): INR, APTT in the last 8760 hours.  BMP: No results for input(s): NA, K, CL, CO2, GLUCOSE, BUN, CALCIUM, CREATININE, GFRNONAA, GFRAA in the last 8760 hours.  Invalid input(s): CMP  LIVER FUNCTION TESTS: No results for input(s): BILITOT, AST, ALT, ALKPHOS, PROT, ALBUMIN in the last 8760 hours.  Assessment and Plan:  1 month status post right GSV transcatheter laser occlusion.  She has been compliant with walking and daily compression stocking.  Currently no symptoms or associated leg pain.  Ultrasound confirms occlusion of the treated segment.  Posterior medial mid calf varicosities are mostly thrombosed.  Plan: Continue daily compression stockings as much as possible, walking, and advance exercise routine.  Outpatient follow-up with ultrasound in 6 months.  Electronically Signed: Berdine Dance 06/10/2021, 2:00 PM   I spent a total of    25 Minutes in face to face in clinical consultation, greater than 50% of which was counseling/coordinating care for This patient with venous deficiency and  varicose veins

## 2021-07-25 ENCOUNTER — Encounter: Payer: Self-pay | Admitting: Primary Care

## 2021-07-25 ENCOUNTER — Ambulatory Visit: Payer: Managed Care, Other (non HMO) | Admitting: Primary Care

## 2021-07-25 VITALS — BP 120/72 | HR 82 | Temp 98.2°F | Ht 67.0 in | Wt 140.0 lb

## 2021-07-25 DIAGNOSIS — Z9189 Other specified personal risk factors, not elsewhere classified: Secondary | ICD-10-CM | POA: Diagnosis not present

## 2021-07-25 DIAGNOSIS — R110 Nausea: Secondary | ICD-10-CM | POA: Diagnosis not present

## 2021-07-25 DIAGNOSIS — Z1231 Encounter for screening mammogram for malignant neoplasm of breast: Secondary | ICD-10-CM

## 2021-07-25 DIAGNOSIS — R195 Other fecal abnormalities: Secondary | ICD-10-CM | POA: Insufficient documentation

## 2021-07-25 DIAGNOSIS — F411 Generalized anxiety disorder: Secondary | ICD-10-CM | POA: Diagnosis not present

## 2021-07-25 MED ORDER — ONDANSETRON 4 MG PO TBDP: 4.0000 mg | ORAL_TABLET | Freq: Three times a day (TID) | ORAL | 0 refills | Status: DC | PRN

## 2021-07-25 NOTE — Assessment & Plan Note (Signed)
Since GI infection in January 2023. ? ?No alarm signs on exam or in HPI. ?Continue Culturelle probiotic for several more weeks. ? ?Consider labs, stool studies, and GI referral if symptoms persist. She will update in a few weeks. I offered labs today but she declines and will update. ?

## 2021-07-25 NOTE — Patient Instructions (Signed)
Continue the probiotic. ? ?Increase fiber intake. ? ?Please notify me if your symptoms persist. ? ?It was a pleasure to see you today! ? ?

## 2021-07-25 NOTE — Assessment & Plan Note (Signed)
Significant family history of breast cancer in mother, sister. ? ?Her lifetime risk is 37%, calculated per GYN. ?

## 2021-07-25 NOTE — Progress Notes (Signed)
? ?Subjective:  ? ? Patient ID: Penny Hansen, female    DOB: Mar 26, 1986, 36 y.o.   MRN: [id number] ? ?HPI ? ?Penny Hansen is a very pleasant 36 y.o. female with a history of relapsing remitting multiple sclerosis, anxiety, fatigue who presents today to discuss abdominal symptoms. She is also due for screening mammogram in June. She is also requesting a refill of her Zofran. ? ?In early January 2023 she developed a "stomach bug" with symptoms body aches, diarrhea, nausea. All symptoms have resolved since except for loose stools. She is experiencing loose stools which occur twice daily. Prior to her symptoms she had three formed bowel movements daily. ? ?She's taken metamucil which did help to bulk her stool but caused a gel like texture to her stool so she stopped. She began Culturelle two ago without significant improvement, but did have a formed bowel movement yesterday.  ? ?She denies unexplained weight loss, abdominal pain, bloody stools.  She does have a family history of ulcerative colitis in her mother.  ? ?BP Readings from Last 3 Encounters:  ?07/25/21 120/72  ?05/16/21 122/72  ?03/07/21 119/73  ? ? ? ? ?Review of Systems  ?Constitutional:  Negative for fever.  ?Gastrointestinal:  Negative for abdominal pain, blood in stool, constipation, diarrhea and nausea.  ?     Loose stools  ?Musculoskeletal:  Negative for myalgias.  ? ?   ? ? ?Past Medical History:  ?Diagnosis Date  ? Asthma   ? GERD (gastroesophageal reflux disease)   ? silent GERD which has caused cough in the past  ? ? ?Social History  ? ?Socioeconomic History  ? Marital status: Significant Other  ?  Spouse name: Not on file  ? Number of children: 0  ? Years of education: Bachelors  ? Highest education level: Not on file  ?Occupational History  ? Occupation: [employer]  ?Tobacco Use  ? Smoking status: Never  ? Smokeless tobacco: Never  ?Vaping Use  ? Vaping Use: Never used  ?Substance and Sexual Activity  ? Alcohol use: Yes  ?   Alcohol/week: 0.0 standard drinks  ?  Comment: rare  ? Drug use: No  ? Sexual activity: Yes  ?  Birth control/protection: Pill  ?Other Topics Concern  ? Not on file  ?Social History Narrative  ? 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 Married  ? No children  ? Moved to [place] in 2007 from [place]  ? Enjoys relaxing.   ? Right handed   ? Caffeine use: Coffee every day  ? Tea very rare  ? ?Social Determinants of Health  ? ?Financial Resource Strain: Not on file  ?Food Insecurity: Not on file  ?Transportation Needs: Not on file  ?Physical Activity: Not on file  ?Stress: Not on file  ?Social Connections: Not on file  ?Intimate Partner Violence: Not on file  ? ? ?Past Surgical History:  ?Procedure Laterality Date  ? IR EMBO VENOUS NOT HEMORR HEMANG  INC GUIDE ROADMAPPING  05/16/2021  ? TONSILECTOMY/ADENOIDECTOMY WITH MYRINGOTOMY  2012  ? WISDOM TOOTH EXTRACTION  2010  ? ? ?Family History  ?Problem Relation Age of Onset  ? Ulcerative colitis Mother   ?     Living  ? Breast cancer Mother   ? Breast cancer Paternal Grandmother 46  ?     breast cancer with mastectomy, chemo and radiation  ? Diabetes Paternal Grandmother   ? Cancer Paternal Grandmother   ?  Breast  ? Hyperlipidemia Father   ?     Living  ? Breast cancer Sister   ? Breast cancer Maternal Grandmother 66  ?     breast cancer: lumpectomy  ? Cancer Maternal Grandmother   ?     Breast  ? Heart disease Maternal Grandfather   ?     MI; dissecting aneurysm  ? Heart attack Maternal Grandfather   ? Hypertension Maternal Grandfather   ? Prostate cancer Paternal Grandfather   ? Healthy Sister   ?     x2  ? ? ?Allergies  ?Allergen Reactions  ? Amoxicillin-Pot Clavulanate Other (See Comments)  ?  Gi upset  ? ? ?Current Outpatient Medications on File Prior to Visit  ?Medication Sig Dispense Refill  ? albuterol (VENTOLIN HFA) 108 (90 Base) MCG/ACT inhaler Inhale 2 puffs into the lungs every 6 (six) hours as needed for wheezing or shortness of breath. 1 each 0   ? Armodafinil 200 MG TABS One po q AM 30 tablet 5  ? Ascorbic Acid (VITAMIN C) 100 MG tablet Take 100 mg by mouth as directed.    ? Cholecalciferol (VITAMIN D-3) 125 MCG (5000 UT) TABS     ? Glatiramer Acetate 40 MG/ML SOSY Inject 1 Dose into the skin 3 (three) times a week. 12 mL 11  ? hydrOXYzine (ATARAX/VISTARIL) 10 MG tablet Take 1-2 tablets (10-20 mg total) by mouth 2 (two) times daily as needed for anxiety. 30 tablet 0  ? Multiple Vitamins-Minerals (MULTIVITAMIN GUMMIES WOMENS) CHEW Chew by mouth as directed.    ? ondansetron (ZOFRAN ODT) 4 MG disintegrating tablet Take 1 tablet (4 mg total) by mouth every 8 (eight) hours as needed for nausea or vomiting. 15 tablet 0  ? ?No current facility-administered medications on file prior to visit.  ? ? ?BP 120/72 (BP Location: Left Arm, Patient Position: Sitting, Cuff Size: Normal)   Pulse 82   Temp 98.2 ?F (36.8 ?C) (Temporal)   Ht 5\' 7"  (1.702 m)   Wt 140 lb (63.5 kg)   BMI 21.93 kg/m?  ?Objective:  ? Physical Exam ?Constitutional:   ?   Appearance: She is not ill-appearing.  ?Cardiovascular:  ?   Rate and Rhythm: Normal rate.  ?Pulmonary:  ?   Effort: Pulmonary effort is normal.  ?Abdominal:  ?   General: Bowel sounds are normal.  ?   Palpations: Abdomen is soft.  ?   Tenderness: There is no abdominal tenderness.  ?Musculoskeletal:  ?   Cervical back: Neck supple.  ?Skin: ?   General: Skin is warm and dry.  ? ? ? ? ? ?   ?Assessment & Plan:  ? ? ? ? ?This visit occurred during the SARS-CoV-2 public health emergency.  Safety protocols were in place, including screening questions prior to the visit, additional usage of staff PPE, and extensive cleaning of exam room while observing appropriate contact time as indicated for disinfecting solutions.  ?

## 2021-07-25 NOTE — Assessment & Plan Note (Signed)
Refilled Zofran for which she uses PRN during anxiety attacks. ? ?

## 2021-08-15 ENCOUNTER — Ambulatory Visit: Payer: Managed Care, Other (non HMO) | Admitting: Neurology

## 2021-09-11 ENCOUNTER — Ambulatory Visit
Admission: RE | Admit: 2021-09-11 | Discharge: 2021-09-11 | Disposition: A | Discharge status: Home / Self Care | Payer: Managed Care, Other (non HMO) | Source: Ambulatory Visit | Attending: Primary Care | Admitting: Primary Care

## 2021-09-11 DIAGNOSIS — Z1231 Encounter for screening mammogram for malignant neoplasm of breast: Secondary | ICD-10-CM

## 2021-09-11 DIAGNOSIS — Z9189 Other specified personal risk factors, not elsewhere classified: Secondary | ICD-10-CM

## 2021-09-11 IMAGING — MG MM DIGITAL SCREENING BILAT W/ TOMO AND CAD
8 series · 9 of 24 positions shown · non-contrast
Comparison: Previous exam(s).

CLINICAL DATA: Screening. Strong family history of breast cancer.
Patient's mother was diagnosed 59; patient's sister was diagnosed at
age 36. Patient's maternal grandmother was diagnosed at 70; her
paternal grandmother was diagnosed at 55.

EXAM:
DIGITAL SCREENING BILATERAL MAMMOGRAM WITH TOMOSYNTHESIS AND CAD
TECHNIQUE: Bilateral screening digital craniocaudal and mediolateral oblique
mammograms were obtained. Bilateral screening digital breast
tomosynthesis was performed. The images were evaluated with
computer-aided detection.

[R CC synth-2D]
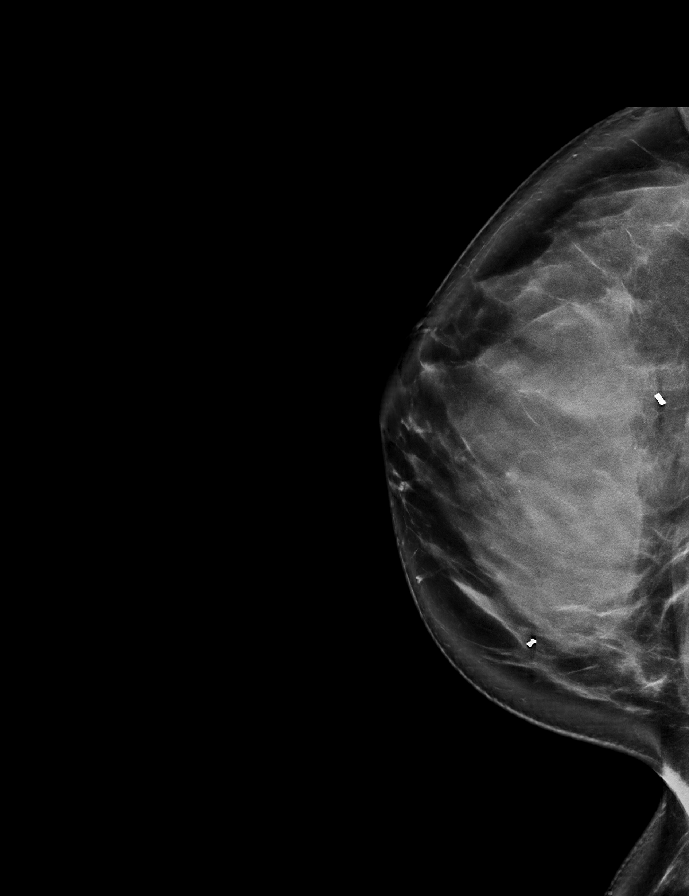

[L MLO synth-2D]
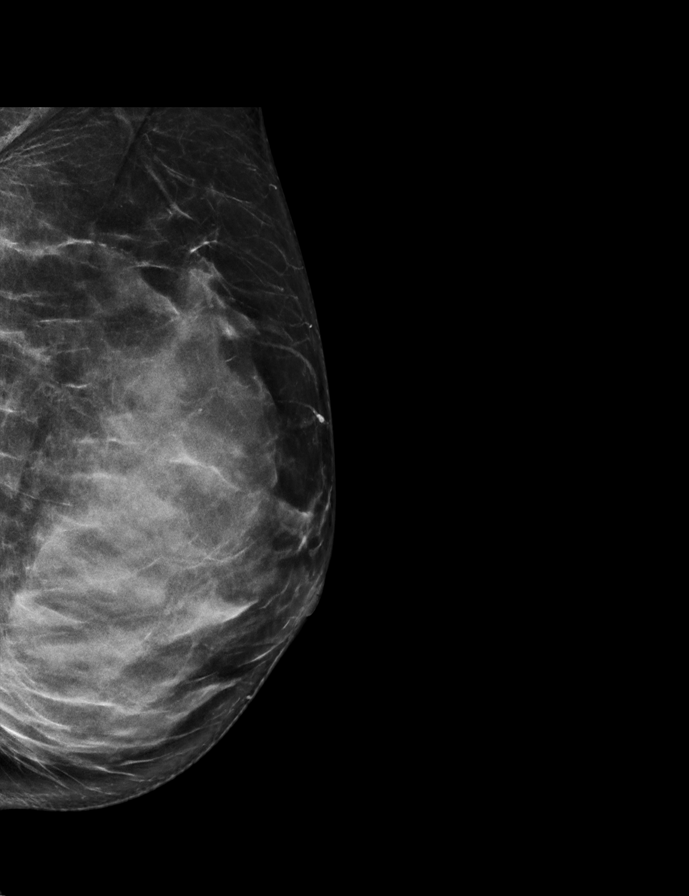

[L CC synth-2D]
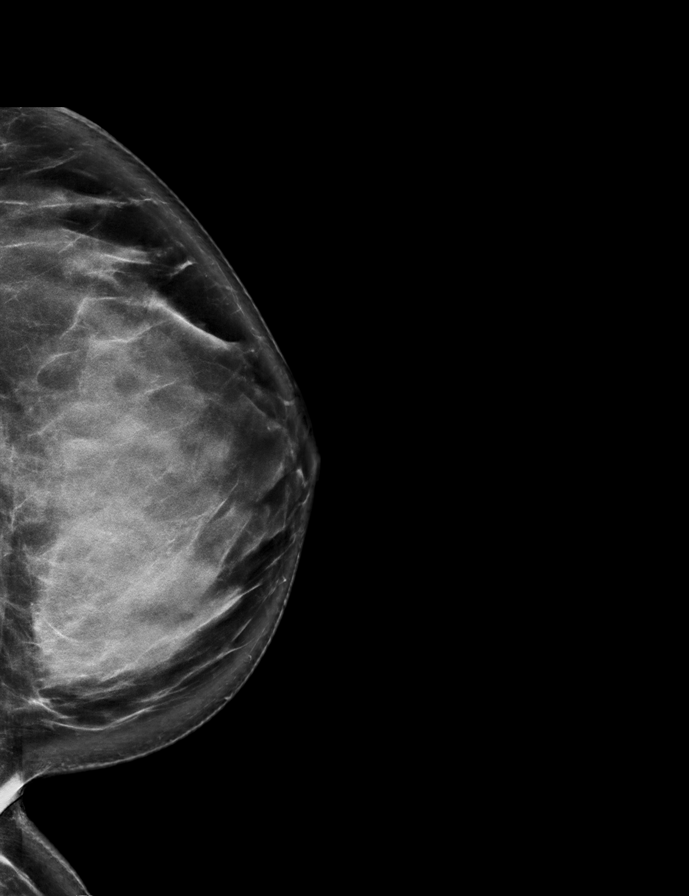

[R MLO synth-2D]
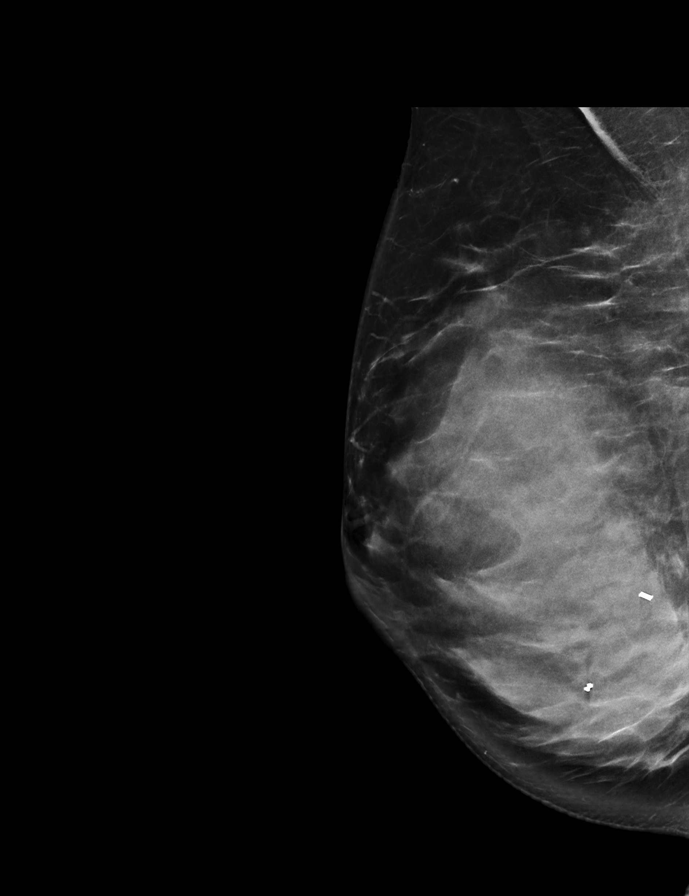

[R MLO tomo · 2 of 78 frames shown]
[frame 26/78]
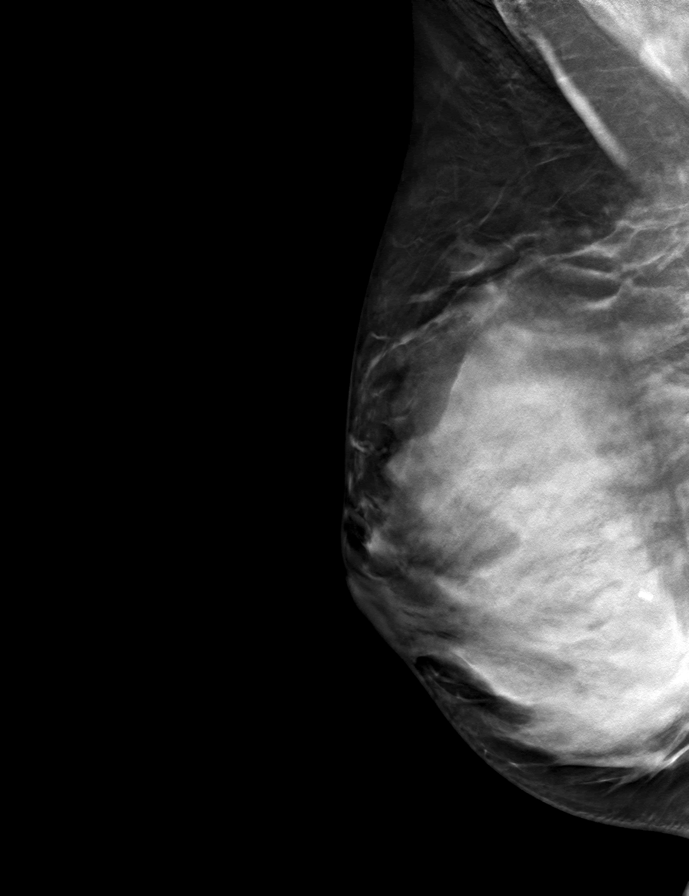
[frame 39/78]
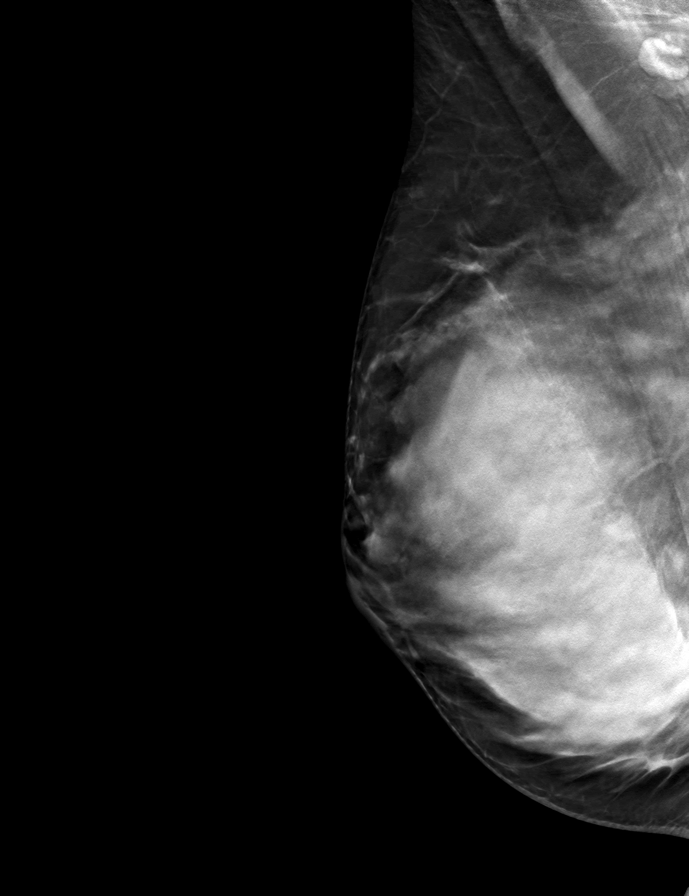

[L CC tomo · tomo slice 43/84.0]
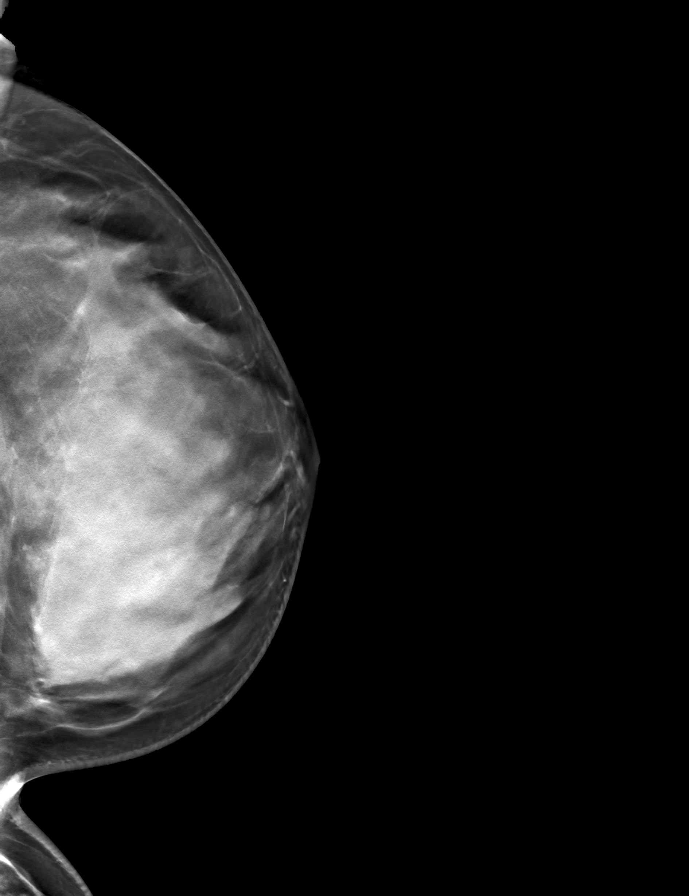

[R CC tomo · tomo slice 45/88.0]
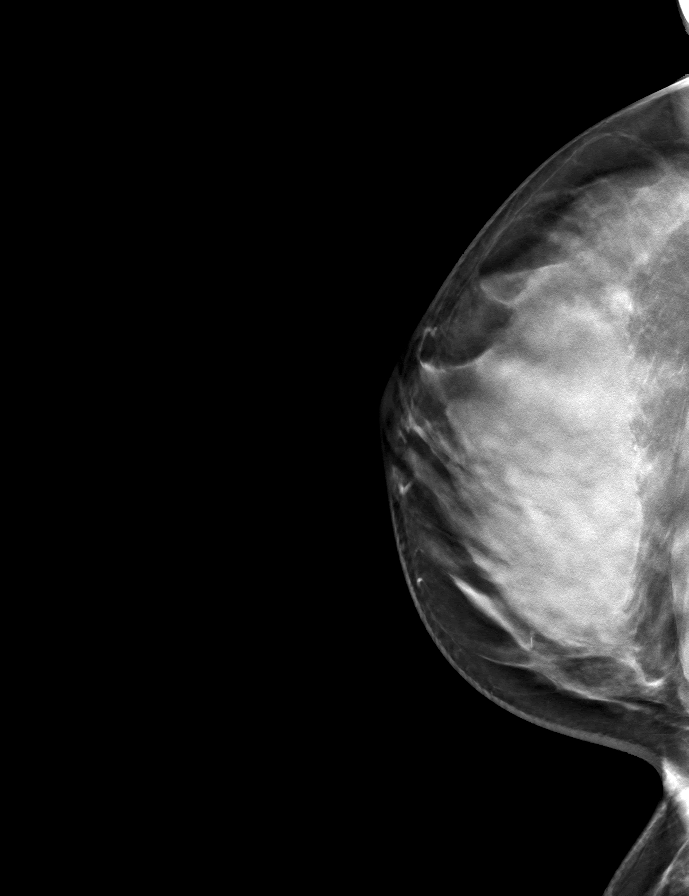

[L MLO tomo · tomo slice 37/72.0]
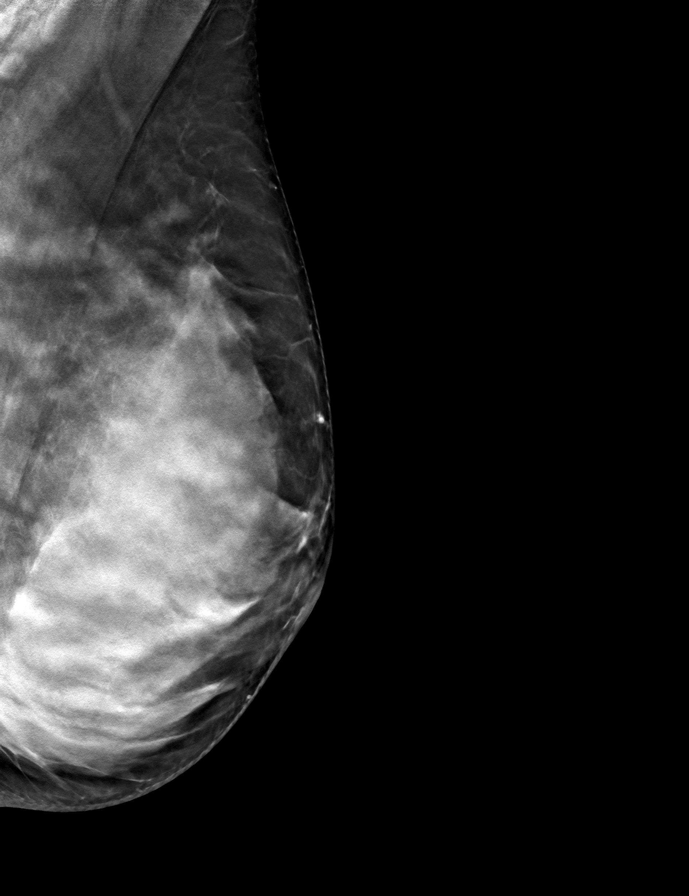

[9 of 24 positions shown; findings below may reference images not displayed]

ACR Breast Density Category d: The breast tissue is extremely dense,
which lowers the sensitivity of mammography
FINDINGS: There are no findings suspicious for malignancy.
IMPRESSION: No mammographic evidence of malignancy. A result letter of this
screening mammogram will be mailed directly to the patient.

RECOMMENDATION:
Given patient's strong family history, recommend annual screening
mammogram and MRI.

BI-RADS CATEGORY  1: Negative.

## 2021-10-02 ENCOUNTER — Encounter: Payer: Self-pay | Admitting: Neurology

## 2021-10-02 ENCOUNTER — Telehealth: Payer: Self-pay | Admitting: Neurology

## 2021-10-02 ENCOUNTER — Ambulatory Visit: Payer: Managed Care, Other (non HMO) | Admitting: Neurology

## 2021-10-02 VITALS — BP 121/73 | HR 70 | Ht 67.0 in | Wt 142.0 lb

## 2021-10-02 DIAGNOSIS — R5383 Other fatigue: Secondary | ICD-10-CM | POA: Diagnosis not present

## 2021-10-02 DIAGNOSIS — G35 Multiple sclerosis: Secondary | ICD-10-CM | POA: Diagnosis not present

## 2021-10-02 DIAGNOSIS — Z3009 Encounter for other general counseling and advice on contraception: Secondary | ICD-10-CM

## 2021-10-02 NOTE — Progress Notes (Signed)
[facility]  PATIENT: Liviana Mills DOB: Dec 08, 1985  REFERRING DOCTOR OR PCP: Vernona Rieger, NP SOURCE: Patient, notes from PCP, MRI images personally reviewed.  _________________________________   HISTORICAL  CHIEF COMPLAINT:  Chief Complaint  Patient presents with   Follow-up    Rm 1, alone. Here for 6 month MS f/u, on glatiramer and tolerating well. Has had frequent frontal tension HA, since March. Continues to have fatigue. Has stopped taking armodafinil since not preventing pregnancy.     HISTORY OF PRESENT ILLNESS:   Analiz Tvedt is a 36 y.o. 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    Update 02/13/2021:  She is on glatiramer.  She is tolerating it well.  She has not had any definite MS exacerbations.     She chose Copaxone as she is hoping to become pregnant this year (fiance has reversed vasectomy).      She is having no difficulty with gait, balance, strength or sensation.  Bladder function is fine.  Vision is fine.  She gets    She has fatigue. Armodafinil 100 mg helps and she takes prn.      She note this more after she goes to the gym or if she has a longer day at work.   She sleeps 6-9 hours of sleep depending on her work schedule.   She usually works 12 hour shifts a few days in a row each week.  Mood and cognition are doing well.  She takes Vit D.   MS history: In June 2020, she had an MRI of the brain as part of a "dummy run" for an MS study.  It was abnormal showing foci consistent with MS.  In retrospect, back in 2014, she had some tingling in the right flank that lasted about 3 days.   She was working long shifts so assumed it was a muscle strain.  She also has had some sensory symptoms in the right arm.  She had additional MRI of the spine 10/11/2018 that showed a normal spinal cord.  Repeat MRI of the brain was performed 05/05/2019 and showed no new lesions.  Repeat MRI of the brain 03/28/2020 showed 2 new foci, one of the periventricular splenium of the  corpus callosum and another in the right frontal lobe.  She then had a lumbar puncture 04/12/2020.  It was abnormal showing oligoclonal bands and elevated IgG index. She started glatiramer February 2022.  Imaging and other studies: MRI 09/13/2018 showed T2/FLAIR hyperintense foci in the hemispheres including the periventricular and juxtacortical white matter.  None of the foci look to be acute.  MRI brain 10/11/2018 was unchanged.  Normal enhancement pattern.  MRI of the cervical and thoracic spine 10/11/2018 showed a normal spinal cord.  MRI of the brain 05/05/2019 showed no new lesions  MRI of the brain 03/28/2020 showed 2 new foci, 1 in the periventricular splenium and another in the right frontal lobe.  These do not enhance.  Echocardiogram with bubbles 09/30/2018 showed some bubbles crossing late (not c/w ASD/PFO but could be pulmonary AVMs.   Follow up transcranial dopplers with agitated saline showed no bubbles.     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   REVIEW OF SYSTEMS: Constitutional: No fevers, chills, sweats, or change in appetite Eyes: No visual changes, double vision, eye pain Ear, nose and throat: No hearing loss, ear pain, nasal congestion, sore throat Cardiovascular: No chest pain, palpitations Respiratory:  No shortness of breath at rest or with exertion.   No wheezes GastrointestinaI: No nausea, vomiting, diarrhea, abdominal pain, fecal incontinence Genitourinary:  No dysuria, urinary retention or frequency.  No nocturia. Musculoskeletal:  No neck pain. SI pain on left Integumentary: No rash, pruritus, skin lesions Neurological: as above Psychiatric: No depression at this time.  No anxiety Endocrine: No palpitations, diaphoresis, change in appetite, change in weigh or increased thirst Hematologic/Lymphatic:  No anemia, purpura, petechiae. Allergic/Immunologic: No itchy/runny  eyes, nasal congestion, recent allergic reactions, rashes  ALLERGIES: Allergies  Allergen Reactions   Amoxicillin-Pot Clavulanate Other (See Comments)    Gi upset    HOME MEDICATIONS:  Current Outpatient Medications:    albuterol (VENTOLIN HFA) 108 (90 Base) MCG/ACT inhaler, Inhale 2 puffs into the lungs every 6 (six) hours as needed for wheezing or shortness of breath., Disp: 1 each, Rfl: 0   Armodafinil 200 MG TABS, One po q AM, Disp: 30 tablet, Rfl: 5   Ascorbic Acid (VITAMIN C) 100 MG tablet, Take 100 mg by mouth as directed., Disp: , Rfl:    Cholecalciferol (VITAMIN D-3) 125 MCG (5000 UT) TABS, , Disp: , Rfl:    Glatiramer Acetate 40 MG/ML SOSY, Inject 1 Dose into the skin 3 (three) times a week., Disp: 12 mL, Rfl: 11   hydrOXYzine (ATARAX/VISTARIL) 10 MG tablet, Take 1-2 tablets (10-20 mg total) by mouth 2 (two) times daily as needed for anxiety., Disp: 30 tablet, Rfl: 0   Multiple Vitamins-Minerals (MULTIVITAMIN GUMMIES WOMENS) CHEW, Chew by mouth as directed., Disp: , Rfl:    ondansetron (ZOFRAN ODT) 4 MG disintegrating tablet, Take 1 tablet (4 mg total) by mouth every 8 (eight) hours as needed for nausea or vomiting., Disp: 15 tablet, Rfl: 0  PAST MEDICAL HISTORY: Past Medical History:  Diagnosis Date   Asthma    GERD (gastroesophageal reflux disease)    silent GERD which has caused cough in the past    PAST SURGICAL HISTORY: Past Surgical History:  Procedure Laterality Date   IR EMBO VENOUS NOT HEMORR HEMANG  INC GUIDE ROADMAPPING  05/16/2021   TONSILECTOMY/ADENOIDECTOMY WITH MYRINGOTOMY  2012   WISDOM TOOTH EXTRACTION  2010    FAMILY HISTORY: Family History  Problem Relation Age of Onset   Ulcerative colitis Mother        Living   Breast cancer Mother 31   Hyperlipidemia Father        Living   Breast cancer Sister 51   Healthy Sister        x2   Breast cancer Maternal Grandmother 65       breast cancer: lumpectomy   Cancer Maternal Grandmother        Breast    Heart disease Maternal Grandfather        MI; dissecting aneurysm   Heart attack Maternal Grandfather    Hypertension Maternal Grandfather    Breast cancer Paternal Grandmother 73       breast cancer with mastectomy, chemo and radiation   Diabetes Paternal Grandmother    Cancer Paternal Grandmother        Breast   Prostate cancer Paternal Grandfather     SOCIAL HISTORY:  Social History   Socioeconomic History   Marital status: Significant Other    Spouse name: Not on file   Number of children: 0   Years of education: Bachelors   Highest education level: Not on file  Occupational History   Occupation: [employer]  Tobacco Use  Smoking status: Never   Smokeless tobacco: Never  Vaping Use   Vaping Use: Never used  Substance and Sexual Activity   Alcohol use: Yes    Alcohol/week: 0.0 standard drinks of alcohol    Comment: rare   Drug use: No   Sexual activity: Yes    Birth control/protection: Pill  Other Topics Concern   Not on file  Social History Narrative   Works as Regulatory affairs officer at [employer]   Married   No children   Moved to [place] in 2007 from [place]   Enjoys relaxing.    Right handed    Caffeine use: Coffee every day   Tea very rare   Social Determinants of Health   Financial Resource Strain: Not on file  Food Insecurity: Not on file  Transportation Needs: Not on file  Physical Activity: Not on file  Stress: Not on file  Social Connections: Not on file  Intimate Partner Violence: Not on file     PHYSICAL EXAM  Vitals:   10/02/21 0853  BP: 121/73  Pulse: 70  Weight: 142 lb (64.4 kg)  Height: 5\' 7"  (1.702 m)    Body mass index is 22.24 kg/m.   General: The patient is well-developed and well-nourished and in no acute distress.     Neurologic Exam  Mental status: The patient is alert and oriented at the time of the examination. The patient has apparent normal recent and remote memory, with an apparently normal  attention span and concentration ability.   Speech is normal.  Cranial nerves: Extraocular movements are full.There is good facial sensation to soft touch bilaterally.Facial strength is normal.  Trapezius and sternocleidomastoid strength is normal. No dysarthria is noted. No obvious hearing deficits are noted.  Motor:  Muscle bulk is normal.   Muscle tone is normal.  Strength is normal in the arms and legs.  Sensory: No Phalen's or Tinel's signs.  Sensory testing is intact to pinprick, soft touch and vibration sensation in all 4 extremities.  Coordination: Cerebellar testing reveals good finger-nose-finger and heel-to-shin bilaterally.  Gait and station: Station is normal.   Gait and tandem gait are normal.  Romberg is negative.  Reflexes: Deep tendon reflexes are symmetric and normal bilaterally.         ASSESSMENT AND PLAN  1. Relapsing remitting multiple sclerosis (HCC)   2. Other fatigue   3. Family planning counseling      1.  She will continue Copaxone.  We spent some time discussing family-planning issues.  She is hoping to get pregnant later this year.  She will continue Copaxone and stop sometime during the first trimester if she does become pregnant.  We will check an MRI of the brain to determine if there is any breakthrough activity.  If this is occurring, we will need to consider a more efficacious disease modifying therapy.  I would consider Kesimpta or Ocrevus, stopping after conception and reinstating after birth. 2.   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   Return to see me in 6 months or sooner for new or worsening neurologic symptoms.    Caedon Bond A. , MD, [facility] 10/02/2021, 9:44 AM Certified in Neurology, Clinical Neurophysiology, Sleep Medicine and Neuroimaging  [facility] [address] [place], [place] [id number]

## 2021-10-02 NOTE — Telephone Encounter (Signed)
[employer] sent to [facility] they obtain auth

## 2021-10-03 ENCOUNTER — Ambulatory Visit: Payer: Managed Care, Other (non HMO) | Admitting: Neurology

## 2021-10-23 ENCOUNTER — Ambulatory Visit
Admission: RE | Admit: 2021-10-23 | Discharge: 2021-10-23 | Disposition: A | Discharge status: Home / Self Care | Payer: Managed Care, Other (non HMO) | Source: Ambulatory Visit | Attending: Neurology | Admitting: Neurology

## 2021-10-23 DIAGNOSIS — G35 Multiple sclerosis: Secondary | ICD-10-CM

## 2021-10-23 MED ORDER — GADOBUTROL 1 MMOL/ML IV SOLN
7.0000 mL | Freq: Once | INTRAVENOUS | Status: AC | PRN
  Administered 2021-10-23: 7 mL via INTRAVENOUS

## 2021-12-06 ENCOUNTER — Other Ambulatory Visit: Payer: Self-pay | Admitting: Interventional Radiology

## 2021-12-06 DIAGNOSIS — I872 Venous insufficiency (chronic) (peripheral): Secondary | ICD-10-CM

## 2021-12-26 ENCOUNTER — Ambulatory Visit
Admission: RE | Admit: 2021-12-26 | Discharge: 2021-12-26 | Disposition: A | Discharge status: Home / Self Care | Payer: Managed Care, Other (non HMO) | Source: Ambulatory Visit | Attending: Interventional Radiology | Admitting: Interventional Radiology

## 2021-12-26 DIAGNOSIS — I872 Venous insufficiency (chronic) (peripheral): Secondary | ICD-10-CM

## 2021-12-26 NOTE — Progress Notes (Signed)
Patient ID: Penny Hansen, female   DOB: 07-Dec-1985, 36 y.o.   MRN: [id number]       Chief Complaint:  7 status post right GSV transcatheter laser occlusion  Referring Physician(s): Dr. Carlis Abbott  History of Present Illness: Penny Hansen is a 36 y.o. female who is now approximately 29-month status post right GSV transcatheter laser occlusion for right GSV venous insufficiency and symptomatic right calf varicosities.  She continues to do very well.  She remains compliant with intermittent thigh-high compression stockings.  No recurrent lower extremity symptoms.  Pain and bruising has resolved.  Residual hyperpigmentation at the entry site in the lower calf remains.  No new varicosities.  She continues to walk daily.  Overall she is pleased with the outcome.  Ultrasound today confirms retraction and occlusion of the right GSV treated segment.  No recanalization.  Right posterior mid calf varicosities are decompressed and partially thrombosed.  No enlarging subsurface varicosities by ultrasound.  Past Medical History:  Diagnosis Date   Asthma    GERD (gastroesophageal reflux disease)    silent GERD which has caused cough in the past    Past Surgical History:  Procedure Laterality Date   IR EMBO VENOUS NOT HEMORR HEMANG  INC GUIDE ROADMAPPING  05/16/2021   TONSILECTOMY/ADENOIDECTOMY WITH MYRINGOTOMY  2012   WISDOM TOOTH EXTRACTION  2010    Allergies: Amoxicillin-pot clavulanate  Medications: Prior to Admission medications   Medication Sig Start Date End Date Taking? Authorizing Provider  albuterol (VENTOLIN HFA) 108 (90 Base) MCG/ACT inhaler Inhale 2 puffs into the lungs every 6 (six) hours as needed for wheezing or shortness of breath. 10/30/20   Pleas Koch, NP  Armodafinil 200 MG TABS One po q AM 02/13/21   Sater, Nanine Means, MD  Ascorbic Acid (VITAMIN C) 100 MG tablet Take 100 mg by mouth as directed.    [provider]  Cholecalciferol (VITAMIN D-3) 125 MCG  (5000 UT) TABS  10/18/19   [provider]  Glatiramer Acetate 40 MG/ML SOSY Inject 1 Dose into the skin 3 (three) times a week. 02/13/21   Sater, Nanine Means, MD  hydrOXYzine (ATARAX/VISTARIL) 10 MG tablet Take 1-2 tablets (10-20 mg total) by mouth 2 (two) times daily as needed for anxiety. 01/30/21   Pleas Koch, NP  Multiple Vitamins-Minerals (MULTIVITAMIN GUMMIES WOMENS) CHEW Chew by mouth as directed.    [provider]  ondansetron (ZOFRAN ODT) 4 MG disintegrating tablet Take 1 tablet (4 mg total) by mouth every 8 (eight) hours as needed for nausea or vomiting. 07/25/21   Pleas Koch, NP     Family History  Problem Relation Age of Onset   Ulcerative colitis Mother        Living   Breast cancer Mother 27   Hyperlipidemia Father        Living   Breast cancer Sister 43   Healthy Sister        x2   Breast cancer Maternal Grandmother 23       breast cancer: lumpectomy   Cancer Maternal Grandmother        Breast   Heart disease Maternal Grandfather        MI; dissecting aneurysm   Heart attack Maternal Grandfather    Hypertension Maternal Grandfather    Breast cancer Paternal Grandmother 9       breast cancer with mastectomy, chemo and radiation   Diabetes Paternal Grandmother    Cancer Paternal Grandmother  Breast   Prostate cancer Paternal Grandfather     Social History   Socioeconomic History   Marital status: Significant Other    Spouse name: Not on file   Number of children: 0   Years of education: Bachelors   Highest education level: Not on file  Occupational History   Occupation: [employer]  Tobacco Use   Smoking status: Never   Smokeless tobacco: Never  Vaping Use   Vaping Use: Never used  Substance and Sexual Activity   Alcohol use: Yes    Alcohol/week: 0.0 standard drinks of alcohol    Comment: rare   Drug use: No   Sexual activity: Yes    Birth control/protection: Pill  Other Topics Concern   Not on file   Social History Narrative   Works as Research officer, political party at [employer]   Married   No children   Moved to [place] in 2007 from [place]   Enjoys relaxing.    Right handed    Caffeine use: Coffee every day   Tea very rare   Social Determinants of Health   Financial Resource Strain: Not on file  Food Insecurity: Not on file  Transportation Needs: Not on file  Physical Activity: Not on file  Stress: Not on file  Social Connections: Not on file     Review of Systems: A 12 point ROS discussed and pertinent positives are indicated in the HPI above.  All other systems are negative.  Review of Systems  Vital Signs: There were no vitals taken for this visit.    Physical Exam Constitutional:      General: She is not in acute distress.    Appearance: She is normal weight. She is not toxic-appearing.  Eyes:     General: No scleral icterus.    Conjunctiva/sclera: Conjunctivae normal.  Skin:    General: Skin is warm and dry.     Coloration: Skin is not jaundiced.     Findings: No lesion or rash.     Comments: No recurrent lower extremity varicosities on exam.  Residual small area of hyperpigmentation in the medial high ankle at the entry site.  Neurological:     Mental Status: She is alert.         Imaging: US Venous Img Lower Unilateral Right (DVT)  Result Date: 12/26/2021 CLINICAL DATA:  Six month status post right GSV transcatheter laser occlusion EXAM: RIGHT LOWER EXTREMITY VENOUS DOPPLER ULTRASOUND TECHNIQUE: Gray-scale sonography with graded compression, as well as color Doppler and duplex ultrasound were performed to evaluate the lower extremity deep venous systems from the level of the common femoral vein and including the common femoral, femoral, profunda femoral, popliteal and calf veins including the posterior tibial, peroneal and gastrocnemius veins when visible. The superficial great saphenous vein was also interrogated. Spectral Doppler was utilized to  evaluate flow at rest and with distal augmentation maneuvers in the common femoral, femoral and popliteal veins. COMPARISON:  None Available. FINDINGS: Contralateral Common Femoral Vein: Respiratory phasicity is normal and symmetric with the symptomatic side. No evidence of thrombus. Normal compressibility. Common Femoral Vein: No evidence of thrombus. Normal compressibility, respiratory phasicity and response to augmentation. Saphenofemoral Junction: No evidence of thrombus. Normal compressibility and flow on color Doppler imaging. Profunda Femoral Vein: No evidence of thrombus. Normal compressibility and flow on color Doppler imaging. Femoral Vein: No evidence of thrombus. Normal compressibility, respiratory phasicity and response to augmentation. Popliteal Vein: No evidence of thrombus. Normal compressibility, respiratory phasicity and response to  augmentation. Calf Veins: No evidence of thrombus. Normal compressibility and flow on color Doppler imaging. Superficial Great Saphenous Vein: Treated GSV segment remains occluded throughout the thigh, knee and calf regions. No recanalization. Venous Reflux:  No recurrent reflux. Other Findings: Small posterior mid calf sub surface nondistended varicosities are partially thrombosed. These also are stable. IMPRESSION: Negative for DVT.  Right GSV treated segment remains occluded. Small posterior mid calf sub surface varicosities remain partially thrombosed. Electronically Signed   By: Jerilynn Mages.  Tyreona Panjwani M.D.   On: 12/26/2021 14:14   [place] RAD EVAL AND MGMT  Result Date: 12/26/2021 Please refer to "Notes" to see consult details.   Labs:  CBC: No results for input(s): "WBC", "HGB", "HCT", "PLT" in the last 8760 hours.  COAGS: No results for input(s): "INR", "APTT" in the last 8760 hours.  BMP: No results for input(s): "NA", "K", "CL", "CO2", "GLUCOSE", "BUN", "CALCIUM", "CREATININE", "GFRNONAA", "GFRAA" in the last 8760 hours.  Invalid input(s): "CMP"  LIVER  FUNCTION TESTS: No results for input(s): "BILITOT", "AST", "ALT", "ALKPHOS", "PROT", "ALBUMIN" in the last 8760 hours.  Assessment and Plan:  15-month status post right GSV transcatheter laser occlusion.  She has been compliant with walking and compression stockings.  No recurrent symptoms or leg pain.  Ultrasound confirms retraction and occlusion of the treated GSV segment.  Posterior medial mid calf varicosities remain partially thrombosed and decompressed.  Plan: Continue intermittent daily compression stocking is much as possible, walking, and routine exercise.  At this point, outpatient follow-up will be as needed for any recurrent symptoms.  Electronically Signed: Greggory Keen 12/26/2021, 3:30 PM   I spent a total of    40 Minutes in face to face in clinical consultation, greater than 50% of which was counseling/coordinating care for this patient with a right GSV venous insufficiency and varicose vein

## 2022-01-02 ENCOUNTER — Other Ambulatory Visit: Payer: Self-pay | Admitting: Primary Care

## 2022-01-02 DIAGNOSIS — F411 Generalized anxiety disorder: Secondary | ICD-10-CM

## 2022-01-02 DIAGNOSIS — R110 Nausea: Secondary | ICD-10-CM

## 2022-01-02 MED ORDER — HYDROXYZINE HCL 10 MG PO TABS: 10.0000 mg | ORAL_TABLET | Freq: Two times a day (BID) | ORAL | 0 refills | Status: AC | PRN

## 2022-01-02 MED ORDER — ONDANSETRON 4 MG PO TBDP: 4.0000 mg | ORAL_TABLET | Freq: Three times a day (TID) | ORAL | 0 refills | Status: DC | PRN

## 2022-01-13 ENCOUNTER — Other Ambulatory Visit: Payer: Self-pay | Admitting: Neurology

## 2022-01-24 ENCOUNTER — Encounter: Payer: Self-pay | Admitting: Neurology

## 2022-01-27 NOTE — Telephone Encounter (Signed)
Called [facility] at [phone]. Spoke w/ [place] [employer]ers. She states she sees where pt called in earlier and they did a re-work and approved for her to get glatiramer acetate. This is ready to be shipped. Pt can call back to schedule shipment. Nothing further needed.

## 2022-03-19 DIAGNOSIS — Z803 Family history of malignant neoplasm of breast: Secondary | ICD-10-CM

## 2022-03-19 DIAGNOSIS — Z9189 Other specified personal risk factors, not elsewhere classified: Secondary | ICD-10-CM

## 2022-03-25 ENCOUNTER — Encounter: Payer: Self-pay | Admitting: Neurology

## 2022-03-28 ENCOUNTER — Ambulatory Visit
Admission: RE | Admit: 2022-03-28 | Discharge: 2022-03-28 | Disposition: A | Discharge status: Home / Self Care | Payer: Managed Care, Other (non HMO) | Source: Ambulatory Visit | Attending: Primary Care

## 2022-03-28 ENCOUNTER — Other Ambulatory Visit: Payer: Self-pay | Admitting: Primary Care

## 2022-03-28 DIAGNOSIS — Z9189 Other specified personal risk factors, not elsewhere classified: Secondary | ICD-10-CM

## 2022-03-28 DIAGNOSIS — Z803 Family history of malignant neoplasm of breast: Secondary | ICD-10-CM

## 2022-03-28 DIAGNOSIS — R9389 Abnormal findings on diagnostic imaging of other specified body structures: Secondary | ICD-10-CM

## 2022-03-28 MED ORDER — GADOPICLENOL 0.5 MMOL/ML IV SOLN
6.0000 mL | Freq: Once | INTRAVENOUS | Status: AC | PRN
  Administered 2022-03-28: 6 mL via INTRAVENOUS

## 2022-04-17 ENCOUNTER — Ambulatory Visit: Payer: Managed Care, Other (non HMO) | Admitting: Neurology

## 2022-04-17 ENCOUNTER — Encounter: Payer: Self-pay | Admitting: Neurology

## 2022-04-17 VITALS — BP 125/68 | HR 81 | Ht 67.0 in | Wt 144.0 lb

## 2022-04-17 DIAGNOSIS — R5383 Other fatigue: Secondary | ICD-10-CM | POA: Diagnosis not present

## 2022-04-17 DIAGNOSIS — E559 Vitamin D deficiency, unspecified: Secondary | ICD-10-CM | POA: Diagnosis not present

## 2022-04-17 DIAGNOSIS — G35 Multiple sclerosis: Secondary | ICD-10-CM

## 2022-04-17 NOTE — Progress Notes (Signed)
[facility]  PATIENT: Penny Hansen DOB: [birth year]  REFERRING DOCTOR OR PCP: Alma Friendly, NP SOURCE: Patient, notes from PCP, MRI images personally reviewed.  _________________________________   HISTORICAL  CHIEF COMPLAINT:  Chief Complaint  Patient presents with   Follow-up    Patient in room 10, for MS reports doing well.     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    Update 04/17/2022:  She is on glatiramer.  She is tolerating it well.  She has not had any definite MS exacerbations.     She chose Copaxone as she is hoping to become pregnant this year (fiance has reversed vasectomy).    She is trying to get pregnant  She is neurologically stable and is active.   She is having no difficulty with gait, balance, strength or sensation.  Bladder function is fine.  Vision is fine.  She gets   She has fatigue. Armodafinil 100 mg helps and she takes prn.      She note this more after she goes to the gym or if she has a longer day at work.   She sleeps 6-9 hours of sleep depending on her work schedule.   She usually works 12 hour shifts a few days in a row each week.  Mood and cognition are doing well.  She notes some triceps twitching after working out.      She takes Vit D supplements for deficiency.    MS history: In June 2020, she had an MRI of the brain as part of a "dummy run" for an MS study.  It was abnormal showing foci consistent with MS.  In retrospect, back in 2014, she had some tingling in the right flank that lasted about 3 days.   She was working long shifts so assumed it was a muscle strain.  She also has had some sensory symptoms in the right arm.  She had additional MRI of the spine 10/11/2018 that showed a normal spinal cord.  Repeat MRI of the brain was performed 05/05/2019 and showed no new lesions.  Repeat MRI of the brain 03/28/2020 showed 2 new foci, one of the periventricular splenium of the corpus  callosum and another in the right frontal lobe.  She then had a lumbar puncture 04/12/2020.  It was abnormal showing oligoclonal bands and elevated IgG index. She started glatiramer February 2022.  Hansen and other studies: MRI 09/13/2018 showed T2/FLAIR hyperintense foci in the hemispheres including the periventricular and juxtacortical white matter.  None of the foci look to be acute.  MRI brain 10/11/2018 was unchanged.  Normal enhancement pattern.  MRI of the cervical and thoracic spine 10/11/2018 showed a normal spinal cord.  MRI of the brain 05/05/2019 showed no new lesions  MRI of the brain 03/28/2020 showed 2 new foci, 1 in the periventricular splenium and another in the right frontal lobe.  These do not enhance.  Echocardiogram with bubbles 09/30/2018 showed some bubbles crossing late (not c/w ASD/PFO but could be pulmonary AVMs.   Follow up transcranial dopplers with agitated saline showed no bubbles.     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   REVIEW OF SYSTEMS: Constitutional: No fevers, chills, sweats, or change in appetite Eyes: No visual changes, double vision, eye pain Ear, nose and throat: No hearing loss, ear pain, nasal congestion, sore throat Cardiovascular:  No chest pain, palpitations Respiratory:  No shortness of breath at rest or with exertion.   No wheezes GastrointestinaI: No nausea, vomiting, diarrhea, abdominal pain, fecal incontinence Genitourinary:  No dysuria, urinary retention or frequency.  No nocturia. Musculoskeletal:  No neck pain. SI pain on left Integumentary: No rash, pruritus, skin lesions Neurological: as above Psychiatric: No depression at this time.  No anxiety Endocrine: No palpitations, diaphoresis, change in appetite, change in weigh or increased thirst Hematologic/Lymphatic:  No anemia, purpura, petechiae. Allergic/Immunologic: No itchy/runny eyes, nasal  congestion, recent allergic reactions, rashes  ALLERGIES: Allergies  Allergen Reactions   Amoxicillin-Pot Clavulanate Other (See Comments)    Gi upset    HOME MEDICATIONS:  Current Outpatient Medications:    albuterol (VENTOLIN HFA) 108 (90 Base) MCG/ACT inhaler, Inhale 2 puffs into the lungs every 6 (six) hours as needed for wheezing or shortness of breath., Disp: 1 each, Rfl: 0   Ascorbic Acid (VITAMIN C) 100 MG tablet, Take 100 mg by mouth as directed., Disp: , Rfl:    Cholecalciferol (VITAMIN D-3) 125 MCG (5000 UT) TABS, , Disp: , Rfl:    Glatiramer Acetate 40 MG/ML SOSY, INJECT 40 MG UNDER THE SKIN THREE TIMES A WEEK, Disp: 12 mL, Rfl: 11   hydrOXYzine (ATARAX) 10 MG tablet, Take 1-2 tablets (10-20 mg total) by mouth 2 (two) times daily as needed for anxiety., Disp: 30 tablet, Rfl: 0   Multiple Vitamins-Minerals (MULTIVITAMIN GUMMIES WOMENS) CHEW, Chew by mouth as directed., Disp: , Rfl:    ondansetron (ZOFRAN ODT) 4 MG disintegrating tablet, Take 1 tablet (4 mg total) by mouth every 8 (eight) hours as needed for nausea or vomiting., Disp: 15 tablet, Rfl: 0   Armodafinil 200 MG TABS, One po q AM (Patient not taking: Reported on 04/17/2022), Disp: 30 tablet, Rfl: 5  PAST MEDICAL HISTORY: Past Medical History:  Diagnosis Date   Asthma    GERD (gastroesophageal reflux disease)    silent GERD which has caused cough in the past    PAST SURGICAL HISTORY: Past Surgical History:  Procedure Laterality Date   IR EMBO VENOUS NOT HEMORR HEMANG  INC GUIDE ROADMAPPING  05/16/2021   TONSILECTOMY/ADENOIDECTOMY WITH MYRINGOTOMY  2012   WISDOM TOOTH EXTRACTION  2010    FAMILY HISTORY: Family History  Problem Relation Age of Onset   Ulcerative colitis Mother        Living   Breast cancer Mother 27   Hyperlipidemia Father        Living   Breast cancer Sister 2   Healthy Sister        x2   Breast cancer Maternal Grandmother 75       breast cancer: lumpectomy   Cancer Maternal  Grandmother        Breast   Heart disease Maternal Grandfather        MI; dissecting aneurysm   Heart attack Maternal Grandfather    Hypertension Maternal Grandfather    Breast cancer Paternal Grandmother 43       breast cancer with mastectomy, chemo and radiation   Diabetes Paternal Grandmother    Cancer Paternal Grandmother        Breast   Prostate cancer Paternal Grandfather     SOCIAL HISTORY:  Social History   Socioeconomic History   Marital status: Significant Other    Spouse name: Not on file   Number of children: 0   Years of education: Bachelors   Highest education level: Not on file  Occupational  History   Occupation: Penny Hansen  Tobacco Use   Smoking status: Never   Smokeless tobacco: Never  Vaping Use   Vaping Use: Never used  Substance and Sexual Activity   Alcohol use: Yes    Alcohol/week: 0.0 standard drinks of alcohol    Comment: rare   Drug use: No   Sexual activity: Yes    Birth control/protection: Pill  Other Topics Concern   Not on file  Social History Narrative   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    Right handed    Caffeine use: Coffee every day   Tea very rare   Social Determinants of Health   Financial Resource Strain: Not on file  Food Insecurity: Not on file  Transportation Needs: Not on file  Physical Activity: Not on file  Stress: Not on file  Social Connections: Not on file  Intimate Partner Violence: Not on file     PHYSICAL EXAM  Vitals:   04/17/22 0952  BP: 125/68  Pulse: 81  Weight: 144 lb (65.3 kg)  Height: 5\' 7"  (1.702 m)     Body mass index is 22.55 kg/m.   General: The patient is well-developed and well-nourished and in no acute distress.     Neurologic Exam  Mental status: The patient is alert and oriented at the time of the examination. The patient has apparent normal recent and remote memory,  with an apparently normal attention span and concentration ability.   Speech is normal.  Cranial nerves: Extraocular movements are full.There is good facial sensation to soft touch bilaterally.Facial strength is normal.  Trapezius and sternocleidomastoid strength is normal. No dysarthria is noted. No obvious hearing deficits are noted.  Motor:  Muscle bulk is normal.   Muscle tone is normal.  Strength is normal in the arms and legs.  Sensory: No Phalen's or Tinel's signs.  Sensory testing is intact to pinprick, soft touch and vibration sensation in all 4 extremities.  Coordination: Cerebellar testing reveals good finger-nose-finger and heel-to-shin bilaterally.  Gait and station: Station is normal.   Gait and tandem gait are normal.  Romberg negativee.  Reflexes: Deep tendon reflexes are symmetric and normal bilaterally.         ASSESSMENT AND PLAN  1. Relapsing remitting multiple sclerosis (HCC)   2. Other fatigue   3. Vitamin D deficiency       1.   Continue glatiramer.  If she becomes pregnant she will stop sometime during the first trimester and resume shortly after delivery.  She would likely be interested in a different DMT at that time. 2.   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  I will also check TSH and vitamin D for her fatigue and deficiency 3.   Return to see me in 6 months or sooner for new or worsening neurologic symptoms.    Trana Ressler A. , MD, [facility] 04/17/2022, 10:26 AM Certified in Neurology, Clinical Neurophysiology, Sleep Medicine and Neuroimaging  [facility] [address] [place], [place] [phone]

## 2022-04-18 LAB — CBC WITH DIFFERENTIAL/PLATELET
Basophils Absolute: 0.1 10*3/uL (ref 0.0–0.2)
Basos: 1 %
EOS (ABSOLUTE): 0.1 10*3/uL (ref 0.0–0.4)
Eos: 2 %
Hematocrit: 42.6 % (ref 34.0–46.6)
Hemoglobin: 14.1 g/dL (ref 11.1–15.9)
Immature Grans (Abs): 0 10*3/uL (ref 0.0–0.1)
Immature Granulocytes: 0 %
Lymphocytes Absolute: 1.7 10*3/uL (ref 0.7–3.1)
Lymphs: 32 %
MCH: 30.5 pg (ref 26.6–33.0)
MCHC: 33.1 g/dL (ref 31.5–35.7)
MCV: 92 fL (ref 79–97)
Monocytes Absolute: 0.4 10*3/uL (ref 0.1–0.9)
Monocytes: 8 %
Neutrophils Absolute: 3.1 10*3/uL (ref 1.4–7.0)
Neutrophils: 57 %
Platelets: 342 10*3/uL (ref 150–450)
RBC: 4.62 x10E6/uL (ref 3.77–5.28)
RDW: 12.1 % (ref 11.7–15.4)
WBC: 5.4 10*3/uL (ref 3.4–10.8)

## 2022-04-18 LAB — THYROID PANEL WITH TSH
Free Thyroxine Index: 2.3 (ref 1.2–4.9)
T3 Uptake Ratio: 32 % (ref 24–39)
T4, Total: 7.1 ug/dL (ref 4.5–12.0)
TSH: 0.993 u[IU]/mL (ref 0.450–4.500)

## 2022-04-18 LAB — VITAMIN D 25 HYDROXY (VIT D DEFICIENCY, FRACTURES): Vit D, 25-Hydroxy: 60.3 ng/mL (ref 30.0–100.0)

## 2022-04-23 ENCOUNTER — Ambulatory Visit
Admission: RE | Admit: 2022-04-23 | Discharge: 2022-04-23 | Disposition: A | Discharge status: Home / Self Care | Payer: Managed Care, Other (non HMO) | Source: Ambulatory Visit | Attending: Primary Care | Admitting: Primary Care

## 2022-04-23 DIAGNOSIS — Z9189 Other specified personal risk factors, not elsewhere classified: Secondary | ICD-10-CM

## 2022-04-23 DIAGNOSIS — R9389 Abnormal findings on diagnostic imaging of other specified body structures: Secondary | ICD-10-CM

## 2022-04-23 MED ORDER — GADOPICLENOL 0.5 MMOL/ML IV SOLN
6.0000 mL | Freq: Once | INTRAVENOUS | Status: AC | PRN
  Administered 2022-04-23: 6 mL via INTRAVENOUS

## 2022-06-05 ENCOUNTER — Other Ambulatory Visit: Payer: Self-pay | Admitting: Primary Care

## 2022-06-05 DIAGNOSIS — J4520 Mild intermittent asthma, uncomplicated: Secondary | ICD-10-CM

## 2022-06-05 MED ORDER — ALBUTEROL SULFATE HFA 108 (90 BASE) MCG/ACT IN AERS: 2.0000 | INHALATION_SPRAY | Freq: Four times a day (QID) | RESPIRATORY_TRACT | 0 refills | Status: AC | PRN

## 2022-06-05 NOTE — Telephone Encounter (Signed)
Patient is due for CPE/follow up in April, this will be required prior to any further refills.  Please schedule, thank you!

## 2022-06-05 NOTE — Telephone Encounter (Signed)
Spoke to pt, scheduled cpe for 07/16/22

## 2022-06-05 NOTE — Telephone Encounter (Signed)
From: Burnice Logan Batterman To: Office of Pleas Koch, NP Sent: 06/04/2022 7:42 PM EST Subject: Medication Renewal Request  Refills have been requested for the following medications:   albuterol (VENTOLIN HFA) 108 (90 Base) MCG/ACT inhaler [Heith Haigler K Derek Huneycutt]  Preferred pharmacy: [id number] [place], [place] - [address] method: PArlyss Gandy

## 2022-06-27 ENCOUNTER — Other Ambulatory Visit: Payer: Self-pay | Admitting: Primary Care

## 2022-06-27 DIAGNOSIS — J4520 Mild intermittent asthma, uncomplicated: Secondary | ICD-10-CM

## 2022-07-16 ENCOUNTER — Ambulatory Visit (INDEPENDENT_AMBULATORY_CARE_PROVIDER_SITE_OTHER): Payer: Managed Care, Other (non HMO) | Admitting: Primary Care

## 2022-07-16 ENCOUNTER — Encounter: Payer: Self-pay | Admitting: Primary Care

## 2022-07-16 VITALS — BP 98/60 | HR 76 | Temp 98.1°F | Ht 67.0 in | Wt 146.0 lb

## 2022-07-16 DIAGNOSIS — G35 Multiple sclerosis: Secondary | ICD-10-CM | POA: Diagnosis not present

## 2022-07-16 DIAGNOSIS — Z0000 Encounter for general adult medical examination without abnormal findings: Secondary | ICD-10-CM | POA: Diagnosis not present

## 2022-07-16 DIAGNOSIS — K219 Gastro-esophageal reflux disease without esophagitis: Secondary | ICD-10-CM | POA: Diagnosis not present

## 2022-07-16 DIAGNOSIS — F411 Generalized anxiety disorder: Secondary | ICD-10-CM

## 2022-07-16 DIAGNOSIS — J4520 Mild intermittent asthma, uncomplicated: Secondary | ICD-10-CM

## 2022-07-16 DIAGNOSIS — Z9189 Other specified personal risk factors, not elsewhere classified: Secondary | ICD-10-CM

## 2022-07-16 LAB — COMPREHENSIVE METABOLIC PANEL
ALT: 14 U/L (ref 0–35)
AST: 16 U/L (ref 0–37)
Albumin: 4.7 g/dL (ref 3.5–5.2)
Alkaline Phosphatase: 40 U/L (ref 39–117)
BUN: 11 mg/dL (ref 6–23)
CO2: 27 mEq/L (ref 19–32)
Calcium: 9.8 mg/dL (ref 8.4–10.5)
Chloride: 104 mEq/L (ref 96–112)
Creatinine, Ser: 0.74 mg/dL (ref 0.40–1.20)
GFR: 105.33 mL/min (ref >60.00)
Glucose, Bld: 91 mg/dL (ref 70–99)
Potassium: 4.7 mEq/L (ref 3.5–5.1)
Sodium: 139 mEq/L (ref 135–145)
Total Bilirubin: 0.4 mg/dL (ref 0.2–1.2)
Total Protein: 7.4 g/dL (ref 6.0–8.3)

## 2022-07-16 LAB — LIPID PANEL
Cholesterol: 118 mg/dL (ref 0–200)
HDL: 58.6 mg/dL (ref >39.00)
LDL Cholesterol: 53 mg/dL (ref 0–99)
NonHDL: 58.95
Total CHOL/HDL Ratio: 2
Triglycerides: 28 mg/dL (ref 0.0–149.0)
VLDL: 5.6 mg/dL (ref 0.0–40.0)

## 2022-07-16 NOTE — Patient Instructions (Signed)
Stop by the lab prior to leaving today. I will notify you of your results once received.   It was a pleasure to see you today!  

## 2022-07-16 NOTE — Assessment & Plan Note (Signed)
Controlled.   Continue albuterol inhaler PRN. 

## 2022-07-16 NOTE — Assessment & Plan Note (Signed)
Controlled.  Continue to monitor.  

## 2022-07-16 NOTE — Assessment & Plan Note (Signed)
Stable.  Following with neurology. Reviewed office notes from January 2024.  Continue Glatiramer Acetate 40 mg three times weekly.

## 2022-07-16 NOTE — Progress Notes (Signed)
Subjective:    Patient ID: Penny Hansen, female    DOB: 1985/06/16, 37 y.o.   MRN: [id number]  HPI  Penny Hansen is a very pleasant 37 y.o. female who presents today for complete physical and follow up of chronic conditions.  Immunizations: -Tetanus: Completed > 10 years ago. Declines today.   Diet: Fair diet.  Exercise: No regular exercise.  Eye exam: Completes annually  Dental exam: Completes semi-annually    Pap Smear: UTD, completes per GYN.  Mammogram: MR Breasts completed in December 2023 and January 2024.   BP Readings from Last 3 Encounters:  07/16/22 98/60  04/17/22 125/68  10/02/21 121/73       Review of Systems  Constitutional:  Negative for unexpected weight change.  HENT:  Negative for rhinorrhea.   Respiratory:  Negative for cough and shortness of breath.   Cardiovascular:  Negative for chest pain.  Gastrointestinal:  Negative for constipation and diarrhea.  Genitourinary:  Negative for difficulty urinating.  Musculoskeletal:  Negative for arthralgias and myalgias.  Skin:  Negative for rash.  Allergic/Immunologic: Negative for environmental allergies.  Neurological:  Negative for dizziness and headaches.  Psychiatric/Behavioral:  The patient is not nervous/anxious.          Past Medical History:  Diagnosis Date   Asthma    GERD (gastroesophageal reflux disease)    silent GERD which has caused cough in the past   Numbness 09/20/2018    Social History   Socioeconomic History   Marital status: Significant Other    Spouse name: Not on file   Number of children: 0   Years of education: Bachelors   Highest education level: Not on file  Occupational History   Occupation: [employer]  Tobacco Use   Smoking status: Never   Smokeless tobacco: Never  Vaping Use   Vaping Use: Never used  Substance and Sexual Activity   Alcohol use: Yes    Alcohol/week: 0.0 standard drinks of alcohol    Comment: rare   Drug use: No    Sexual activity: Yes    Birth control/protection: Pill  Other Topics Concern   Not on file  Social History Narrative   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    Right handed    Caffeine use: Coffee every day   Tea very rare   Social Determinants of Health   Financial Resource Strain: Not on file  Food Insecurity: Not on file  Transportation Needs: Not on file  Physical Activity: Not on file  Stress: Not on file  Social Connections: Not on file  Intimate Partner Violence: Not on file    Past Surgical History:  Procedure Laterality Date   IR EMBO VENOUS NOT HEMORR HEMANG  INC GUIDE ROADMAPPING  05/16/2021   TONSILECTOMY/ADENOIDECTOMY WITH MYRINGOTOMY  2012   WISDOM TOOTH EXTRACTION  2010    Family History  Problem Relation Age of Onset   Ulcerative colitis Mother        Living   Breast cancer Mother [id number]   Hyperlipidemia Father        Living   Breast cancer Sister [phone]   Healthy Sister        x2   Breast cancer Maternal Grandmother [phone]       breast cancer: lumpectomy   Cancer Maternal Grandmother        Breast  Heart disease Maternal Grandfather        MI; dissecting aneurysm   Heart attack Maternal Grandfather    Hypertension Maternal Grandfather    Breast cancer Paternal Grandmother 16       breast cancer with mastectomy, chemo and radiation   Diabetes Paternal Grandmother    Cancer Paternal Grandmother        Breast   Prostate cancer Paternal Grandfather     Allergies  Allergen Reactions   Amoxicillin-Pot Clavulanate Other (See Comments)    Gi upset    Current Outpatient Medications on File Prior to Visit  Medication Sig Dispense Refill   albuterol (VENTOLIN HFA) 108 (90 Base) MCG/ACT inhaler Inhale 2 puffs into the lungs every 6 (six) hours as needed for wheezing or shortness of breath. 1 each 0   Ascorbic Acid (VITAMIN C) 100 MG tablet Take 100 mg by mouth as  directed.     Cholecalciferol (VITAMIN D-3) 125 MCG (5000 UT) TABS      Glatiramer Acetate 40 MG/ML SOSY INJECT 40 MG UNDER THE SKIN THREE TIMES A WEEK 12 mL 11   hydrOXYzine (ATARAX) 10 MG tablet Take 1-2 tablets (10-20 mg total) by mouth 2 (two) times daily as needed for anxiety. 30 tablet 0   Multiple Vitamins-Minerals (MULTIVITAMIN GUMMIES WOMENS) CHEW Chew by mouth as directed.     ondansetron (ZOFRAN ODT) 4 MG disintegrating tablet Take 1 tablet (4 mg total) by mouth every 8 (eight) hours as needed for nausea or vomiting. 15 tablet 0   No current facility-administered medications on file prior to visit.    BP 98/60   Pulse 76   Temp 98.1 F (36.7 C) (Temporal)   Ht 5\' 7"  (1.702 m)   Wt 146 lb (66.2 kg)   LMP 07/04/2022 (Exact Date)   SpO2 99%   BMI 22.87 kg/m  Objective:   Physical Exam HENT:     Right Ear: Tympanic membrane and ear canal normal.     Left Ear: Tympanic membrane and ear canal normal.     Nose: Nose normal.  Eyes:     Conjunctiva/sclera: Conjunctivae normal.     Pupils: Pupils are equal, round, and reactive to light.  Neck:     Thyroid: No thyromegaly.  Cardiovascular:     Rate and Rhythm: Normal rate and regular rhythm.     Heart sounds: No murmur heard. Pulmonary:     Effort: Pulmonary effort is normal.     Breath sounds: Normal breath sounds. No rales.  Abdominal:     General: Bowel sounds are normal.     Palpations: Abdomen is soft.     Tenderness: There is no abdominal tenderness.  Musculoskeletal:        General: Normal range of motion.     Cervical back: Neck supple.  Lymphadenopathy:     Cervical: No cervical adenopathy.  Skin:    General: Skin is warm and dry.     Findings: No rash.  Neurological:     Mental Status: She is alert and oriented to person, place, and time.     Cranial Nerves: No cranial nerve deficit.     Deep Tendon Reflexes: Reflexes are normal and symmetric.  Psychiatric:        Mood and Affect: Mood normal.            Assessment & Plan:  Preventative health care Assessment & Plan: Tetanus due, we will hold off for now as she undergoes IUI and IVF.  Pap smear UTD. Mammogram due in July 2024  Discussed the importance of a healthy diet and regular exercise in order for weight loss, and to reduce the risk of further co-morbidity.  Exam stable. Labs pending.  Follow up in 1 year for repeat physical.   Orders: -     Lipid panel -     Comprehensive metabolic panel  Relapsing remitting multiple sclerosis Assessment & Plan: Stable.  Following with neurology. Reviewed office notes from January 2024.  Continue Glatiramer Acetate 40 mg three times weekly.    Mild intermittent asthma without complication Assessment & Plan: Controlled.  Continue albuterol inhaler PRN.   Gastroesophageal reflux disease, unspecified whether esophagitis present Assessment & Plan: Controlled.  Continue to monitor.    GAD (generalized anxiety disorder) Assessment & Plan: Overall controlled.  Continue hydroxyzine 10 mg PRN.   At high risk for breast cancer Assessment & Plan: Reviewed MRI from December 2023 and January 2024. Repeat mammogram in July 2024.         Doreene Nest, NP

## 2022-07-16 NOTE — Assessment & Plan Note (Signed)
Reviewed MRI from December 2023 and January 2024. Repeat mammogram in July 2024.

## 2022-07-16 NOTE — Assessment & Plan Note (Signed)
Tetanus due, we will hold off for now as she undergoes IUI and IVF. Pap smear UTD. Mammogram due in July 2024  Discussed the importance of a healthy diet and regular exercise in order for weight loss, and to reduce the risk of further co-morbidity.  Exam stable. Labs pending.  Follow up in 1 year for repeat physical.

## 2022-07-16 NOTE — Assessment & Plan Note (Signed)
Overall controlled.  Continue hydroxyzine 10 mg PRN. 

## 2022-08-25 ENCOUNTER — Other Ambulatory Visit: Payer: Self-pay | Admitting: *Deleted

## 2022-08-25 ENCOUNTER — Other Ambulatory Visit: Payer: Self-pay | Admitting: Primary Care

## 2022-08-25 ENCOUNTER — Encounter: Payer: Self-pay | Admitting: Neurology

## 2022-08-25 DIAGNOSIS — Z1231 Encounter for screening mammogram for malignant neoplasm of breast: Secondary | ICD-10-CM

## 2022-08-25 DIAGNOSIS — G35 Multiple sclerosis: Secondary | ICD-10-CM

## 2022-08-25 NOTE — Telephone Encounter (Signed)
Sent to GI they obtain [place]

## 2022-09-14 ENCOUNTER — Other Ambulatory Visit: Payer: Managed Care, Other (non HMO)

## 2022-09-18 ENCOUNTER — Ambulatory Visit
Admission: RE | Admit: 2022-09-18 | Discharge: 2022-09-18 | Disposition: A | Discharge status: Home / Self Care | Payer: Managed Care, Other (non HMO) | Source: Ambulatory Visit | Attending: Primary Care | Admitting: Primary Care

## 2022-09-18 DIAGNOSIS — Z1231 Encounter for screening mammogram for malignant neoplasm of breast: Secondary | ICD-10-CM

## 2022-10-08 ENCOUNTER — Ambulatory Visit: Payer: Managed Care, Other (non HMO) | Admitting: Neurology

## 2022-10-13 ENCOUNTER — Encounter: Payer: Self-pay | Admitting: Neurology

## 2022-10-14 ENCOUNTER — Telehealth: Payer: Self-pay

## 2022-10-14 ENCOUNTER — Telehealth: Payer: Self-pay | Admitting: *Deleted

## 2022-10-14 ENCOUNTER — Other Ambulatory Visit (HOSPITAL_COMMUNITY): Payer: Self-pay

## 2022-10-14 NOTE — Telephone Encounter (Signed)
A new telephone call encounter has been made for this PA Request-please see telephone note dated ...10/14/2022

## 2022-10-14 NOTE — Telephone Encounter (Signed)
Pharmacy Patient Advocate Encounter   Received notification from [employer] that prior authorization for Glatiramer Acetate 40MG /ML syringes is required/requested.   PA submitted to [facility] via CoverMyMeds Key or (Medicaid) confirmation # [id number] Status is pending

## 2022-10-14 NOTE — Telephone Encounter (Signed)
  I recently started my new insurance plan with [employer].  I remember paperwork being sent off when I first started my glatiramer acetate, and the copay assistance and specialty pharmacy ([facility]) reached out to me to initiate things.  Will I need to go through that same process again?  Thank you, Penny Hansen   Pt has new insurance and copies to media to view.  [employer].  Needs new PA for her glatiramer acetate.

## 2022-10-16 ENCOUNTER — Ambulatory Visit: Payer: Managed Care, Other (non HMO) | Admitting: Neurology

## 2022-10-17 ENCOUNTER — Other Ambulatory Visit (HOSPITAL_COMMUNITY): Payer: Self-pay

## 2022-10-17 NOTE — Telephone Encounter (Signed)
Pharmacy Patient Advocate Encounter  Received notification from [facility] that Prior Authorization for Glatiramer Acetate 40MG /ML syringes has been APPROVED from 10/15/2022 to 10/14/2023.Marland Kitchen  PA #/Case ID/Reference #: PA Case ID #: [id number]  Copay is $0 per 30DS per [facility].

## 2022-10-28 ENCOUNTER — Other Ambulatory Visit: Payer: Self-pay

## 2022-10-28 ENCOUNTER — Other Ambulatory Visit (HOSPITAL_COMMUNITY): Payer: Self-pay

## 2022-10-29 ENCOUNTER — Ambulatory Visit: Payer: Managed Care, Other (non HMO) | Attending: Primary Care | Admitting: Pharmacist

## 2022-10-29 ENCOUNTER — Other Ambulatory Visit: Payer: Self-pay

## 2022-10-29 ENCOUNTER — Other Ambulatory Visit (HOSPITAL_COMMUNITY): Payer: Self-pay

## 2022-10-29 DIAGNOSIS — Z79899 Other long term (current) drug therapy: Secondary | ICD-10-CM

## 2022-10-29 MED ORDER — GLATIRAMER ACETATE 40 MG/ML ~~LOC~~ SOSY
PREFILLED_SYRINGE | SUBCUTANEOUS | 5 refills | Status: DC
  Filled 2022-10-29: qty 12, fill #0

## 2022-10-29 MED ORDER — GLATIRAMER ACETATE 40 MG/ML ~~LOC~~ SOSY
PREFILLED_SYRINGE | SUBCUTANEOUS | 5 refills | Status: DC
  Filled 2022-10-29: qty 12, 28d supply, fill #0
  Filled 2022-10-29: qty 12, fill #0
  Filled 2022-10-29 (×2): qty 12, 28d supply, fill #0
  Filled 2022-11-27: qty 12, 28d supply, fill #1
  Filled 2022-12-18: qty 12, 28d supply, fill #2
  Filled 2023-01-23: qty 12, 28d supply, fill #3
  Filled 2023-02-23: qty 12, 28d supply, fill #4
  Filled 2023-03-17: qty 12, 28d supply, fill #5

## 2022-10-29 NOTE — Progress Notes (Signed)
   S: Patient presents today for review of their specialty medication.   Patient is currently taking Copaxone (glatiramer) for multiple sclerosis. Patient is managed by Dr. Epimenio Foot for this.   Dosing: SubQ 40 mg 3 times per week administered at least 48 hours apart.   Adherence: confirmed  Efficacy: confirmed  Monitoring: Injection site reactions: endorses a small localized area of irritation/erythema occasionally. Nothing major at this time.  S/sx of hypersensitivity: none S/sx of infection: none Flu-like symptoms: none  Other current adverse effects: none   O:     Lab Results  Component Value Date   WBC 5.4 04/17/2022   HGB 14.1 04/17/2022   HCT 42.6 04/17/2022   MCV 92 04/17/2022   PLT 342 04/17/2022      Chemistry      Component Value Date/Time   NA 139 07/16/2022 1030   K 4.7 07/16/2022 1030   CL 104 07/16/2022 1030   CO2 27 07/16/2022 1030   BUN 11 07/16/2022 1030   CREATININE 0.74 07/16/2022 1030   CREATININE 0.66 10/23/2014 1920      Component Value Date/Time   CALCIUM 9.8 07/16/2022 1030   ALKPHOS 40 07/16/2022 1030   AST 16 07/16/2022 1030   ALT 14 07/16/2022 1030   BILITOT 0.4 07/16/2022 1030   BILITOT 0.5 04/19/2020 1207       A/P: 1. Medication review: patient currently on Copaxone for multiple sclerosis and is tolerating it well. Reviewed the medication with the patient, including the following: Copaxone is a medication used in the treatment of MS. Allow the syringe to stand at room temp for 20 minutes prior to injection. Rotate injection sites to lessen risk of lipoatrophy. Possible adverse effects include pain, chest pain, rash, GI upset, hypersensitivity reaction, increased risk of infection, injection site reaction, and flu-like symptoms. No recommendations for any changes.   Butch Penny, PharmD, [place], CPP Clinical Pharmacist [facility] [phone]

## 2022-10-30 ENCOUNTER — Encounter: Payer: Self-pay | Admitting: Primary Care

## 2022-10-30 ENCOUNTER — Telehealth (INDEPENDENT_AMBULATORY_CARE_PROVIDER_SITE_OTHER): Payer: Commercial Managed Care - PPO | Admitting: Primary Care

## 2022-10-30 DIAGNOSIS — U071 COVID-19: Secondary | ICD-10-CM

## 2022-10-30 DIAGNOSIS — R051 Acute cough: Secondary | ICD-10-CM

## 2022-10-30 MED ORDER — NIRMATRELVIR/RITONAVIR (PAXLOVID)TABLET: 3.0000 | ORAL_TABLET | Freq: Two times a day (BID) | ORAL | 0 refills | Status: AC

## 2022-10-30 NOTE — Patient Instructions (Signed)
Complete the Covid-19 test and send me a picture of the result through MyChart.  If positive, start Paxlovid medication as discussed. Take 3 capsules by mouth twice daily for 5 days.  Avoid public places until your symptoms have significantly improved.   It was a pleasure to see you today!

## 2022-10-30 NOTE — Progress Notes (Signed)
Patient ID: Penny Hansen, female    DOB: 07/08/1985, 37 y.o.   MRN: [id number]  Virtual visit completed through [employer], a video enabled telemedicine application. Due to national recommendations of social distancing due to COVID-19, a virtual visit is felt to be most appropriate for this patient at this time. Reviewed limitations, risks, security and privacy concerns of performing a virtual visit and the availability of in person appointments. I also reviewed that there may be a patient responsible charge related to this service. The patient agreed to proceed.   Patient location: home Provider location: [place] at [facility], office Persons participating in this virtual visit: patient, provider   If any vitals were documented, they were collected by patient at home unless specified below.    There were no vitals taken for this visit.   CC: URI symptoms  Subjective:   HPI: Penny Hansen is a 37 y.o. female with a history of relapsing remitting MS, asthma, GERD presenting on 10/30/2022 for Sinus Problem (MIL tested pos yesterday for Covid /Symptoms started 2 days ago /Sinus pressure, fatigue, headache, sore throat, cough)  Symptom onset two days ago with cough and post nasal drip. She then developed sinus pressure, headaches, fatigue, and cough.  She's been taking Advil, Delsym, Mucinex with little improvement.   She was exposed to her mother in law who tested positive for Covid-19 infection yesterday. She will be starting Paxlovid.   She has not tested for Covid-19 infection yet. She is currently on vacation in [place] with her family. She plans to complete a Covid-19 test today.    She denies fevers, shortness of breath.       Relevant past medical, surgical, family and social history reviewed and updated as indicated. Interim medical history since our last visit reviewed. Allergies and medications reviewed and updated. Outpatient Medications Prior to Visit   Medication Sig Dispense Refill   albuterol (VENTOLIN HFA) 108 (90 Base) MCG/ACT inhaler Inhale 2 puffs into the lungs every 6 (six) hours as needed for wheezing or shortness of breath. 1 each 0   Ascorbic Acid (VITAMIN C) 100 MG tablet Take 100 mg by mouth as directed.     Cholecalciferol (VITAMIN D-3) 125 MCG (5000 UT) TABS      Glatiramer Acetate 40 MG/ML SOSY INJECT 40 MG UNDER THE SKIN THREE TIMES A WEEK 12 mL 5   hydrOXYzine (ATARAX) 10 MG tablet Take 1-2 tablets (10-20 mg total) by mouth 2 (two) times daily as needed for anxiety. 30 tablet 0   Multiple Vitamins-Minerals (MULTIVITAMIN GUMMIES WOMENS) CHEW Chew by mouth as directed.     ondansetron (ZOFRAN ODT) 4 MG disintegrating tablet Take 1 tablet (4 mg total) by mouth every 8 (eight) hours as needed for nausea or vomiting. 15 tablet 0   No facility-administered medications prior to visit.     Per HPI unless specifically indicated in ROS section below Review of Systems  Constitutional:  Positive for fatigue. Negative for fever.  HENT:  Positive for congestion, postnasal drip and sinus pressure.   Respiratory:  Positive for cough. Negative for shortness of breath.   Neurological:  Positive for headaches.   Objective:  There were no vitals taken for this visit.  Wt Readings from Last 3 Encounters:  07/16/22 146 lb (66.2 kg)  04/17/22 144 lb (65.3 kg)  10/02/21 142 lb (64.4 kg)       Physical exam: General: Alert and oriented x 3, no distress, does not appear sickly  Pulmonary: Speaks in complete sentences without increased work of breathing, no cough during visit.  Psychiatric: Normal mood, thought content, and behavior.     Results for orders placed or performed in visit on 07/16/22  Lipid panel  Result Value Ref Range   Cholesterol 118 0 - 200 mg/dL   Triglycerides 40.9 0.0 - 149.0 mg/dL   HDL 81.19 >14.78 mg/dL   VLDL 5.6 0.0 - 29.5 mg/dL   LDL Cholesterol 53 0 - 99 mg/dL   Total CHOL/HDL Ratio 2    NonHDL  58.95   Comprehensive metabolic panel  Result Value Ref Range   Sodium 139 135 - 145 mEq/L   Potassium 4.7 3.5 - 5.1 mEq/L   Chloride 104 96 - 112 mEq/L   CO2 27 19 - 32 mEq/L   Glucose, Bld 91 70 - 99 mg/dL   BUN 11 6 - 23 mg/dL   Creatinine, Ser 6.21 0.40 - 1.20 mg/dL   Total Bilirubin 0.4 0.2 - 1.2 mg/dL   Alkaline Phosphatase 40 39 - 117 U/L   AST 16 0 - 37 U/L   ALT 14 0 - 35 U/L   Total Protein 7.4 6.0 - 8.3 g/dL   Albumin 4.7 3.5 - 5.2 g/dL   GFR 308.65 >78.46 mL/min   Calcium 9.8 8.4 - 10.5 mg/dL   Assessment & Plan:   Problem List Items Addressed This Visit       Other   Acute cough - Primary    Suspicious for Covid-19 infection given exposure to her mother in law.  She will take a Covid-19 test and send results via MyChart. If positive, we will prescribe Paxlovid. She agrees.  She is stable for outpatient treatment.          No orders of the defined types were placed in this encounter.  No orders of the defined types were placed in this encounter.   I discussed the assessment and treatment plan with the patient. The patient was provided an opportunity to ask questions and all were answered. The patient agreed with the plan and demonstrated an understanding of the instructions. The patient was advised to call back or seek an in-person evaluation if the symptoms worsen or if the condition fails to improve as anticipated.  Follow up plan:  Complete the Covid-19 test and send me a picture of the result through MyChart.  If positive, start Paxlovid medication as discussed. Take 3 capsules by mouth twice daily for 5 days.  Avoid public places until your symptoms have significantly improved.   It was a pleasure to see you today!   Doreene Nest, NP

## 2022-10-30 NOTE — Assessment & Plan Note (Signed)
Suspicious for Covid-19 infection given exposure to her mother in law.  She will take a Covid-19 test and send results via MyChart. If positive, we will prescribe Paxlovid. She agrees.  She is stable for outpatient treatment.

## 2022-10-31 ENCOUNTER — Other Ambulatory Visit: Payer: Self-pay

## 2022-11-05 ENCOUNTER — Other Ambulatory Visit (HOSPITAL_COMMUNITY): Payer: Self-pay

## 2022-11-05 ENCOUNTER — Other Ambulatory Visit: Payer: Self-pay

## 2022-11-13 ENCOUNTER — Other Ambulatory Visit (HOSPITAL_COMMUNITY): Payer: Self-pay

## 2022-11-17 ENCOUNTER — Encounter: Payer: Self-pay | Admitting: Neurology

## 2022-11-17 ENCOUNTER — Telehealth: Payer: Self-pay | Admitting: Neurology

## 2022-11-17 ENCOUNTER — Ambulatory Visit: Payer: Commercial Managed Care - PPO | Admitting: Neurology

## 2022-11-17 VITALS — BP 119/76 | HR 86 | Ht 67.0 in | Wt 146.0 lb

## 2022-11-17 DIAGNOSIS — E559 Vitamin D deficiency, unspecified: Secondary | ICD-10-CM | POA: Diagnosis not present

## 2022-11-17 DIAGNOSIS — R5383 Other fatigue: Secondary | ICD-10-CM

## 2022-11-17 DIAGNOSIS — R200 Anesthesia of skin: Secondary | ICD-10-CM | POA: Diagnosis not present

## 2022-11-17 DIAGNOSIS — G35 Multiple sclerosis: Secondary | ICD-10-CM | POA: Diagnosis not present

## 2022-11-17 NOTE — Telephone Encounter (Signed)
Penny Hansen [facility] sent to GI [phone]

## 2022-11-17 NOTE — Progress Notes (Signed)
[facility]  PATIENT: Penny Hansen DOB: 01/02/1986  REFERRING DOCTOR OR PCP: Vernona Rieger, NP SOURCE: Patient, notes from PCP, MRI images personally reviewed.  _________________________________   HISTORICAL  CHIEF COMPLAINT:  Chief Complaint  Patient presents with   Room 10    Pt is here Alone. Pt states that she has been doing okay since her last appointment. Pt states that she doesn't have any complaints to discuss today.     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    Update 11/17/2022:  She is on Glatopa.  She is tolerating it well.  She has not had any definite MS exacerbations.     She chose Copaxone as she is hoping to become pregnant this year (fiance has reversed vasectomy).    She is trying to get pregnant   She will be doing IVF later this year.     She is neurologically stable and is active.   She is having no difficulty with gait, balance, strength or sensation.  Bladder function is fine.  Vision is fine.   She has fatigue. Armodafinil 100 mg helps and she takes prn.      She note this more after she goes to the gym or if she has a longer day at work.   She sleeps 6-9 hours of sleep depending on her work schedule.   She usually works 12 hour shifts a few days in a row each week.  Mood and cognition are doing well.  She notes some triceps twitching after working out.    She sometimes has mils numbness in legs after walking 45 minutes.      She takes Vit D supplements for deficiency.    MS history: In June 2020, she had an MRI of the brain as part of a "dummy run" for an MS study.  It was abnormal showing foci consistent with MS.  In retrospect, back in 2014, she had some tingling in the right flank that lasted about 3 days.   She was working long shifts so assumed it was a muscle strain.  She also has had some sensory symptoms in the right arm.  She had additional MRI of the spine 10/11/2018 that showed a  normal spinal cord.  Repeat MRI of the brain was performed 05/05/2019 and showed no new lesions.  Repeat MRI of the brain 03/28/2020 showed 2 new foci, one of the periventricular splenium of the corpus callosum and another in the right frontal lobe.  She then had a lumbar puncture 04/12/2020.  It was abnormal showing oligoclonal bands and elevated IgG index. She started glatiramer February 2022.  Imaging and other studies: MRI 09/13/2018 showed T2/FLAIR hyperintense foci in the hemispheres including the periventricular and juxtacortical white matter.  None of the foci look to be acute.  MRI brain 10/11/2018 was unchanged.  Normal enhancement pattern.  MRI of the cervical and thoracic spine 10/11/2018 showed a normal spinal cord.  MRI of the brain 05/05/2019 showed no new lesions  MRI of the brain 03/28/2020 showed 2 new foci, 1 in the periventricular splenium and another in the right frontal lobe.  These do not enhance.  Echocardiogram with bubbles 09/30/2018 showed some bubbles crossing late (not c/w ASD/PFO but could be pulmonary AVMs.   Follow up transcranial dopplers with agitated saline showed no bubbles.     ANA/ANCA  09/20/18 were normal or negative  Lumbar puncture 04/12/2020 showed elevated IgG  index and greater than 5 oligoclonal bands.   REVIEW OF SYSTEMS: Constitutional: No fevers, chills, sweats, or change in appetite Eyes: No visual changes, double vision, eye pain Ear, nose and throat: No hearing loss, ear pain, nasal congestion, sore throat Cardiovascular: No chest pain, palpitations Respiratory:  No shortness of breath at rest or with exertion.   No wheezes GastrointestinaI: No nausea, vomiting, diarrhea, abdominal pain, fecal incontinence Genitourinary:  No dysuria, urinary retention or frequency.  No nocturia. Musculoskeletal:  No neck pain. SI pain on left Integumentary: No rash, pruritus, skin lesions Neurological: as above Psychiatric: No depression at this time.  No  anxiety Endocrine: No palpitations, diaphoresis, change in appetite, change in weigh or increased thirst Hematologic/Lymphatic:  No anemia, purpura, petechiae. Allergic/Immunologic: No itchy/runny eyes, nasal congestion, recent allergic reactions, rashes  ALLERGIES: Allergies  Allergen Reactions   Amoxicillin-Pot Clavulanate Other (See Comments)    Gi upset    HOME MEDICATIONS:  Current Outpatient Medications:    albuterol (VENTOLIN HFA) 108 (90 Base) MCG/ACT inhaler, Inhale 2 puffs into the lungs every 6 (six) hours as needed for wheezing or shortness of breath., Disp: 1 each, Rfl: 0   Ascorbic Acid (VITAMIN C) 100 MG tablet, Take 100 mg by mouth as directed., Disp: , Rfl:    Cholecalciferol (VITAMIN D-3) 125 MCG (5000 UT) TABS, , Disp: , Rfl:    Glatiramer Acetate 40 MG/ML SOSY, INJECT 40 MG UNDER THE SKIN THREE TIMES A WEEK, Disp: 12 mL, Rfl: 5   hydrOXYzine (ATARAX) 10 MG tablet, Take 1-2 tablets (10-20 mg total) by mouth 2 (two) times daily as needed for anxiety., Disp: 30 tablet, Rfl: 0   Multiple Vitamins-Minerals (MULTIVITAMIN GUMMIES WOMENS) CHEW, Chew by mouth as directed., Disp: , Rfl:    ondansetron (ZOFRAN ODT) 4 MG disintegrating tablet, Take 1 tablet (4 mg total) by mouth every 8 (eight) hours as needed for nausea or vomiting., Disp: 15 tablet, Rfl: 0  PAST MEDICAL HISTORY: Past Medical History:  Diagnosis Date   Asthma    GERD (gastroesophageal reflux disease)    silent GERD which has caused cough in the past   Multiple sclerosis (HCC)    Numbness 09/20/2018    PAST SURGICAL HISTORY: Past Surgical History:  Procedure Laterality Date   IR EMBO VENOUS NOT HEMORR HEMANG  INC GUIDE ROADMAPPING  05/16/2021   TONSILECTOMY/ADENOIDECTOMY WITH MYRINGOTOMY  2012   WISDOM TOOTH EXTRACTION  2010    FAMILY HISTORY: Family History  Problem Relation Age of Onset   Ulcerative colitis Mother        Living   Breast cancer Mother 46   Hyperlipidemia Father        Living    Breast cancer Sister 59   Healthy Sister        x2   Breast cancer Maternal Grandmother 49       breast cancer: lumpectomy   Cancer Maternal Grandmother        Breast   Heart disease Maternal Grandfather        MI; dissecting aneurysm   Heart attack Maternal Grandfather    Hypertension Maternal Grandfather    Breast cancer Paternal Grandmother 63       breast cancer with mastectomy, chemo and radiation   Diabetes Paternal Grandmother    Cancer Paternal Grandmother        Breast   Prostate cancer Paternal Grandfather     SOCIAL HISTORY:  Social History   Socioeconomic History   Marital status:  Significant Other    Spouse name: Not on file   Number of children: 0   Years of education: Bachelors   Highest education level: Not on file  Occupational History   Occupation: [employer]  Tobacco Use   Smoking status: Never   Smokeless tobacco: Never  Vaping Use   Vaping status: Never Used  Substance and Sexual Activity   Alcohol use: Yes    Alcohol/week: 0.0 standard drinks of alcohol    Comment: rare   Drug use: No   Sexual activity: Yes    Birth control/protection: Pill  Other Topics Concern   Not on file  Social History Narrative   Works as Regulatory affairs officer at [employer]   Married   No children   Moved to [place] in 2007 from [place]   Enjoys relaxing.    Right handed    Caffeine use: Coffee every day   Tea very rare   Social Determinants of Health   Financial Resource Strain: Low Risk  (06/26/2022)   Received from [facility], [facility]   Overall Financial Resource Strain (CARDIA)    Difficulty of Paying Living Expenses: Not hard at all  Food Insecurity: No Food Insecurity (06/26/2022)   Received from [facility], [facility]   Hunger Vital Sign    Worried About Running Out of Food in the Last Year: Never true    Ran Out of Food in the Last Year: Never true  Transportation Needs: No Transportation Needs (06/26/2022)   Received from  [facility], [facility]   PRAPARE - Transportation    Lack of Transportation (Medical): No    Lack of Transportation (Non-Medical): No  Physical Activity: Not on file  Stress: Not on file  Social Connections: Unknown (04/24/2022)   Received from [facility], [facility]   Social Network    Social Network: Not on file  Intimate Partner Violence: Unknown (04/24/2022)   Received from [facility], [facility]   HITS    Physically Hurt: Not on file    Insult or Talk Down To: Not on file    Threaten Physical Harm: Not on file    Scream or Curse: Not on file     PHYSICAL EXAM  Vitals:   11/17/22 1412  BP: 119/76  Pulse: 86  Weight: 146 lb (66.2 kg)  Height: 5\' 7"  (1.702 m)     Body mass index is 22.87 kg/m.   General: The patient is well-developed and well-nourished and in no acute distress.     Neurologic Exam  Mental status: The patient is alert and oriented at the time of the examination. The patient has apparent normal recent and remote memory, with an apparently normal attention span and concentration ability.   Speech is normal.  Cranial nerves: Extraocular movements are full.There is good facial sensation to soft touch bilaterally.Facial strength is normal.  Trapezius and sternocleidomastoid strength is normal. No dysarthria is noted. No obvious hearing deficits are noted.  Motor:  Muscle bulk is normal.   Muscle tone is normal.  Strength is normal in the arms and legs.  Sensory: No Phalen's or Tinel's signs.  Sensory testing is intact to pinprick, soft touch and vibration sensation in all 4 extremities.  Coordination: Cerebellar testing reveals good finger-nose-finger and heel-to-shin bilaterally.  Gait and station: Station is normal.   The gait and tandem gait are normal.  Romberg is negative..  Reflexes: Deep tendon reflexes are symmetric and normal bilaterally.  ASSESSMENT AND PLAN  Relapsing remitting multiple sclerosis (HCC) - Plan:  MR BRAIN W WO CONTRAST  Other fatigue  Numbness - Plan: MR BRAIN W WO CONTRAST  Vitamin D deficiency   1.   Continue glatiramer.  If she becomes pregnant she will stop sometime during the first trimester and resume shortly after delivery.  She would likely be interested in a different DMT at that time. 2.   After pregnancy/delivery can go back on Nuvigil 3.   Return to see me in 6 months or sooner for new or worsening neurologic symptoms.    Evalene Vath A. Epimenio Foot, MD, [facility] 11/17/2022, 2:18 PM Certified in Neurology, Clinical Neurophysiology, Sleep Medicine and Neuroimaging  [facility] [address], [address] [place], [place] [address] [phone]

## 2022-11-20 ENCOUNTER — Other Ambulatory Visit (HOSPITAL_COMMUNITY): Payer: Self-pay

## 2022-11-24 DIAGNOSIS — Z3141 Encounter for fertility testing: Secondary | ICD-10-CM | POA: Diagnosis not present

## 2022-11-24 DIAGNOSIS — Z113 Encounter for screening for infections with a predominantly sexual mode of transmission: Secondary | ICD-10-CM | POA: Diagnosis not present

## 2022-11-26 ENCOUNTER — Other Ambulatory Visit (HOSPITAL_COMMUNITY): Payer: Self-pay

## 2022-11-27 ENCOUNTER — Other Ambulatory Visit (HOSPITAL_COMMUNITY): Payer: Self-pay

## 2022-11-28 ENCOUNTER — Other Ambulatory Visit (HOSPITAL_COMMUNITY): Payer: Self-pay

## 2022-12-08 DIAGNOSIS — N978 Female infertility of other origin: Secondary | ICD-10-CM | POA: Diagnosis not present

## 2022-12-08 DIAGNOSIS — Z3183 Encounter for assisted reproductive fertility procedure cycle: Secondary | ICD-10-CM | POA: Diagnosis not present

## 2022-12-15 DIAGNOSIS — N978 Female infertility of other origin: Secondary | ICD-10-CM | POA: Diagnosis not present

## 2022-12-18 ENCOUNTER — Other Ambulatory Visit (HOSPITAL_COMMUNITY): Payer: Self-pay

## 2022-12-19 ENCOUNTER — Encounter: Payer: Self-pay | Admitting: Neurology

## 2022-12-19 DIAGNOSIS — Z3141 Encounter for fertility testing: Secondary | ICD-10-CM | POA: Diagnosis not present

## 2022-12-19 DIAGNOSIS — N711 Chronic inflammatory disease of uterus: Secondary | ICD-10-CM | POA: Diagnosis not present

## 2022-12-25 ENCOUNTER — Other Ambulatory Visit: Payer: Self-pay | Admitting: Primary Care

## 2022-12-25 DIAGNOSIS — F411 Generalized anxiety disorder: Secondary | ICD-10-CM

## 2022-12-25 DIAGNOSIS — R110 Nausea: Secondary | ICD-10-CM

## 2022-12-25 MED ORDER — ONDANSETRON 4 MG PO TBDP: 4.0000 mg | ORAL_TABLET | Freq: Three times a day (TID) | ORAL | 0 refills | Status: AC | PRN

## 2022-12-27 ENCOUNTER — Encounter (HOSPITAL_COMMUNITY): Payer: Self-pay

## 2022-12-29 ENCOUNTER — Ambulatory Visit
Admission: RE | Admit: 2022-12-29 | Discharge: 2022-12-29 | Disposition: A | Discharge status: Home / Self Care | Payer: Commercial Managed Care - PPO | Source: Ambulatory Visit | Attending: Neurology | Admitting: Neurology

## 2022-12-29 DIAGNOSIS — G35 Multiple sclerosis: Secondary | ICD-10-CM | POA: Diagnosis not present

## 2022-12-29 DIAGNOSIS — R200 Anesthesia of skin: Secondary | ICD-10-CM

## 2022-12-29 MED ORDER — GADOPICLENOL 0.5 MMOL/ML IV SOLN
7.0000 mL | Freq: Once | INTRAVENOUS | Status: AC | PRN
  Administered 2022-12-29: 7 mL via INTRAVENOUS

## 2023-01-15 ENCOUNTER — Other Ambulatory Visit (HOSPITAL_COMMUNITY): Payer: Self-pay

## 2023-01-15 ENCOUNTER — Other Ambulatory Visit: Payer: Self-pay

## 2023-01-15 MED ORDER — ESTRADIOL 0.1 MG/24HR TD PTTW
MEDICATED_PATCH | TRANSDERMAL | 4 refills | Status: DC
  Filled 2023-01-15 – 2023-01-16 (×2): qty 8, 28d supply, fill #0
  Filled 2023-02-03 (×3): qty 8, 28d supply, fill #1

## 2023-01-16 ENCOUNTER — Other Ambulatory Visit (HOSPITAL_COMMUNITY): Payer: Self-pay

## 2023-01-16 ENCOUNTER — Other Ambulatory Visit: Payer: Self-pay

## 2023-01-16 MED ORDER — ESTRADIOL 0.1 MG/24HR TD PTTW
1.0000 | MEDICATED_PATCH | TRANSDERMAL | 4 refills | Status: DC
  Filled 2023-01-16: qty 8, 24d supply, fill #0

## 2023-01-20 ENCOUNTER — Other Ambulatory Visit: Payer: Self-pay

## 2023-01-21 ENCOUNTER — Other Ambulatory Visit (HOSPITAL_COMMUNITY): Payer: Self-pay

## 2023-01-23 ENCOUNTER — Other Ambulatory Visit: Payer: Self-pay

## 2023-01-23 ENCOUNTER — Other Ambulatory Visit (HOSPITAL_COMMUNITY): Payer: Self-pay

## 2023-01-23 NOTE — Progress Notes (Signed)
Specialty Pharmacy Refill Coordination Note  Penny Hansen is a 37 y.o. female contacted today regarding refills of specialty medication(s) Glatiramer Acetate   Patient requested Delivery   Delivery date: 01/29/23   Verified address: [address] [place] [address]   Medication will be filled on 01/28/23.

## 2023-01-26 DIAGNOSIS — Z3183 Encounter for assisted reproductive fertility procedure cycle: Secondary | ICD-10-CM | POA: Diagnosis not present

## 2023-02-03 ENCOUNTER — Other Ambulatory Visit (HOSPITAL_COMMUNITY): Payer: Self-pay

## 2023-02-03 MED ORDER — ESTRADIOL 0.1 MG/24HR TD PTTW
2.0000 | MEDICATED_PATCH | TRANSDERMAL | 3 refills | Status: DC
  Filled 2023-02-04 (×2): qty 16, 20d supply, fill #0

## 2023-02-04 ENCOUNTER — Other Ambulatory Visit (HOSPITAL_COMMUNITY): Payer: Self-pay

## 2023-02-04 ENCOUNTER — Other Ambulatory Visit: Payer: Self-pay

## 2023-02-05 ENCOUNTER — Other Ambulatory Visit (HOSPITAL_COMMUNITY): Payer: Self-pay

## 2023-02-05 ENCOUNTER — Other Ambulatory Visit: Payer: Self-pay

## 2023-02-05 MED ORDER — ESTRADIOL 0.1 MG/24HR TD PTTW
MEDICATED_PATCH | TRANSDERMAL | 3 refills | Status: DC
  Filled 2023-02-05: qty 8, 30d supply, fill #0
  Filled 2023-02-07: qty 8, fill #0
  Filled 2023-02-09 – 2023-02-10 (×2): qty 8, 24d supply, fill #0
  Filled 2023-02-24: qty 8, 24d supply, fill #1

## 2023-02-06 ENCOUNTER — Other Ambulatory Visit (HOSPITAL_COMMUNITY): Payer: Self-pay

## 2023-02-07 ENCOUNTER — Other Ambulatory Visit (HOSPITAL_COMMUNITY): Payer: Self-pay

## 2023-02-07 MED ORDER — ESTRADIOL 2 MG PO TABS
2.0000 mg | ORAL_TABLET | Freq: Two times a day (BID) | ORAL | 1 refills | Status: DC
  Filled 2023-02-07: qty 60, 30d supply, fill #0

## 2023-02-09 ENCOUNTER — Other Ambulatory Visit: Payer: Self-pay

## 2023-02-09 ENCOUNTER — Other Ambulatory Visit (HOSPITAL_COMMUNITY): Payer: Self-pay

## 2023-02-10 ENCOUNTER — Other Ambulatory Visit (HOSPITAL_COMMUNITY): Payer: Self-pay

## 2023-02-10 ENCOUNTER — Other Ambulatory Visit: Payer: Self-pay

## 2023-02-10 ENCOUNTER — Other Ambulatory Visit (HOSPITAL_BASED_OUTPATIENT_CLINIC_OR_DEPARTMENT_OTHER): Payer: Self-pay

## 2023-02-13 ENCOUNTER — Other Ambulatory Visit (HOSPITAL_COMMUNITY): Payer: Self-pay

## 2023-02-13 ENCOUNTER — Other Ambulatory Visit: Payer: Self-pay

## 2023-02-13 MED ORDER — "BD LUER-LOK SYRINGE 18G X 1-1/2"" 3 ML MISC"
3 refills | Status: AC
  Filled 2023-02-13: qty 30, 30d supply, fill #0
  Filled 2023-09-15: qty 30, 30d supply, fill #1

## 2023-02-13 MED ORDER — "NEEDLE (DISP) 22G X 1-1/2"" MISC"
3 refills | Status: DC
  Filled 2023-02-13: qty 30, 30d supply, fill #0

## 2023-02-13 MED ORDER — PROGESTERONE 50 MG/ML IM OIL
50.0000 mg | TOPICAL_OIL | Freq: Every day | INTRAMUSCULAR | 3 refills | Status: DC
  Filled 2023-02-13: qty 30, 30d supply, fill #0

## 2023-02-19 ENCOUNTER — Other Ambulatory Visit: Payer: Self-pay

## 2023-02-23 ENCOUNTER — Other Ambulatory Visit: Payer: Self-pay

## 2023-02-23 NOTE — Progress Notes (Signed)
Specialty Pharmacy Refill Coordination Note  Penny Hansen is a 37 y.o. female contacted today regarding refills of specialty medication(s) Glatiramer Acetate   Patient requested Delivery   Delivery date: 02/24/23   Verified address: [address] [name] [address]   Medication will be filled on 02/23/23.

## 2023-02-24 ENCOUNTER — Other Ambulatory Visit (HOSPITAL_COMMUNITY): Payer: Self-pay

## 2023-02-27 DIAGNOSIS — Z3200 Encounter for pregnancy test, result unknown: Secondary | ICD-10-CM | POA: Diagnosis not present

## 2023-03-02 ENCOUNTER — Other Ambulatory Visit: Payer: Self-pay

## 2023-03-09 ENCOUNTER — Other Ambulatory Visit (HOSPITAL_COMMUNITY): Payer: Self-pay

## 2023-03-09 DIAGNOSIS — O021 Missed abortion: Secondary | ICD-10-CM | POA: Diagnosis not present

## 2023-03-09 MED ORDER — PROGESTERONE 200 MG PO CAPS
200.0000 mg | ORAL_CAPSULE | Freq: Every day | ORAL | 0 refills | Status: AC
  Filled 2023-03-09: qty 14, 14d supply, fill #0

## 2023-03-10 ENCOUNTER — Other Ambulatory Visit (HOSPITAL_COMMUNITY): Payer: Self-pay

## 2023-03-11 ENCOUNTER — Other Ambulatory Visit: Payer: Self-pay

## 2023-03-16 ENCOUNTER — Other Ambulatory Visit (HOSPITAL_COMMUNITY): Payer: Self-pay

## 2023-03-16 ENCOUNTER — Encounter (HOSPITAL_COMMUNITY): Payer: Self-pay

## 2023-03-17 ENCOUNTER — Encounter (HOSPITAL_COMMUNITY): Payer: Self-pay

## 2023-03-17 ENCOUNTER — Other Ambulatory Visit: Payer: Self-pay

## 2023-03-17 NOTE — Progress Notes (Signed)
Specialty Pharmacy Ongoing Clinical Assessment Note  Penny Hansen is a 37 y.o. female who is being followed by the specialty pharmacy service for RxSp Multiple Sclerosis   Patient's specialty medication(s) reviewed today: Glatiramer Acetate   Missed doses in the last 4 weeks: 0   Patient/Caregiver did not have any additional questions or concerns.   Therapeutic benefit summary: Patient is achieving benefit   Adverse events/side effects summary: No adverse events/side effects   Patient's therapy is appropriate to: Continue    Goals Addressed             This Visit's Progress    Stabilization of disease       Patient is on track. Patient will maintain adherence. Patient states that she is responding well to treatment.          Follow up:  6 months  Otto Herb Specialty Pharmacist

## 2023-03-17 NOTE — Progress Notes (Signed)
Specialty Pharmacy Refill Coordination Note  Penny Hansen is a 37 y.o. female contacted today regarding refills of specialty medication(s) Glatiramer Acetate   Patient requested Delivery   Delivery date: 03/20/23   Verified address: [address] [name] [address]   Medication will be filled on 03/19/23.

## 2023-03-18 DIAGNOSIS — O032 Embolism following incomplete spontaneous abortion: Secondary | ICD-10-CM | POA: Diagnosis not present

## 2023-04-07 ENCOUNTER — Other Ambulatory Visit: Payer: Self-pay

## 2023-04-10 ENCOUNTER — Other Ambulatory Visit: Payer: Self-pay

## 2023-04-13 ENCOUNTER — Other Ambulatory Visit: Payer: Self-pay | Admitting: Pharmacist

## 2023-04-13 ENCOUNTER — Other Ambulatory Visit: Payer: Self-pay

## 2023-04-13 ENCOUNTER — Other Ambulatory Visit: Payer: Self-pay | Admitting: Neurology

## 2023-04-13 ENCOUNTER — Other Ambulatory Visit (HOSPITAL_COMMUNITY): Payer: Self-pay

## 2023-04-13 MED ORDER — GLATIRAMER ACETATE 40 MG/ML ~~LOC~~ SOSY
PREFILLED_SYRINGE | SUBCUTANEOUS | 1 refills | Status: DC
  Filled 2023-04-13: qty 12, fill #0

## 2023-04-13 MED ORDER — GLATIRAMER ACETATE 40 MG/ML ~~LOC~~ SOSY
PREFILLED_SYRINGE | SUBCUTANEOUS | 1 refills | Status: DC
  Filled 2023-04-13: qty 12, 28d supply, fill #0
  Filled 2023-05-07: qty 12, 28d supply, fill #1

## 2023-04-13 NOTE — Progress Notes (Signed)
 Specialty Pharmacy Refill Coordination Note  Penny Hansen is a 38 y.o. female contacted today regarding refills of specialty medication(s) Glatiramer  Acetate   Patient requested Delivery   Delivery date: 04/17/23   Verified address: [address] [name] [address]   Medication will be filled on 01.09.25.   Refill request pending - rewrite required.

## 2023-04-13 NOTE — Telephone Encounter (Signed)
 Last seen on 11/17/22 Follow up scheduled on 06/01/23

## 2023-04-14 ENCOUNTER — Other Ambulatory Visit (HOSPITAL_COMMUNITY): Payer: Self-pay

## 2023-04-20 DIAGNOSIS — Z3141 Encounter for fertility testing: Secondary | ICD-10-CM | POA: Diagnosis not present

## 2023-05-01 ENCOUNTER — Other Ambulatory Visit (HOSPITAL_COMMUNITY): Payer: Self-pay

## 2023-05-01 MED ORDER — ESTRADIOL 0.1 MG/24HR TD PTTW
1.0000 | MEDICATED_PATCH | TRANSDERMAL | 3 refills | Status: DC
  Filled 2023-05-01 (×2): qty 8, 24d supply, fill #0
  Filled 2023-05-20: qty 8, 24d supply, fill #1
  Filled 2023-06-11: qty 8, 24d supply, fill #2
  Filled 2023-08-26: qty 8, 24d supply, fill #3

## 2023-05-01 MED ORDER — ESTRADIOL 2 MG PO TABS
2.0000 mg | ORAL_TABLET | Freq: Two times a day (BID) | ORAL | 3 refills | Status: DC
  Filled 2023-05-01 (×2): qty 60, 30d supply, fill #0
  Filled 2023-05-20 – 2023-05-25 (×2): qty 60, 30d supply, fill #1
  Filled 2023-08-26: qty 60, 30d supply, fill #2
  Filled 2023-11-03: qty 60, 30d supply, fill #3

## 2023-05-07 ENCOUNTER — Other Ambulatory Visit: Payer: Self-pay

## 2023-05-07 NOTE — Progress Notes (Signed)
Specialty Pharmacy Refill Coordination Note  Penny Hansen is a 38 y.o. female contacted today regarding refills of specialty medication(s) Glatiramer Acetate   Patient requested Delivery   Delivery date: 05/19/23   Verified address: [address] [name] [address]   Medication will be filled on 05/18/23.

## 2023-05-08 ENCOUNTER — Other Ambulatory Visit (HOSPITAL_COMMUNITY): Payer: Self-pay

## 2023-05-08 MED ORDER — "NEEDLE (DISP) 22G X 1-1/2"" MISC"
3 refills | Status: AC
  Filled 2023-05-08: qty 30, 30d supply, fill #0
  Filled 2023-06-11: qty 30, 30d supply, fill #1
  Filled 2023-09-15: qty 30, 30d supply, fill #2
  Filled 2024-02-26: qty 30, 30d supply, fill #3

## 2023-05-08 MED ORDER — EASY TOUCH ALCOHOL PREP MEDIUM 70 % PADS
MEDICATED_PAD | 3 refills | Status: AC
  Filled 2023-05-08: qty 100, 100d supply, fill #0
  Filled 2024-02-26: qty 100, 100d supply, fill #1

## 2023-05-08 MED ORDER — PROGESTERONE 50 MG/ML IM OIL
50.0000 mg | TOPICAL_OIL | Freq: Every day | INTRAMUSCULAR | 3 refills | Status: AC
  Filled 2023-05-08: qty 30, 30d supply, fill #0
  Filled 2023-06-11: qty 30, 30d supply, fill #1
  Filled 2023-09-15: qty 30, 30d supply, fill #2
  Filled 2023-11-03: qty 30, 30d supply, fill #3

## 2023-05-08 MED ORDER — "NEEDLE (DISP) 18G X 1-1/2"" MISC"
3 refills | Status: AC
  Filled 2023-05-08: qty 30, 30d supply, fill #0
  Filled 2023-06-11: qty 30, 30d supply, fill #1
  Filled 2024-02-26: qty 30, 30d supply, fill #2

## 2023-05-08 MED ORDER — SYRINGE (DISPOSABLE) 3 ML MISC
3 refills | Status: AC
  Filled 2023-05-08: qty 30, 30d supply, fill #0
  Filled 2023-06-11 – 2023-09-15 (×2): qty 30, 30d supply, fill #1
  Filled 2024-02-26: qty 30, 30d supply, fill #2

## 2023-05-08 MED ORDER — METHYLPREDNISOLONE 8 MG PO TABS
8.0000 mg | ORAL_TABLET | Freq: Two times a day (BID) | ORAL | 3 refills | Status: AC
  Filled 2023-05-08: qty 8, 4d supply, fill #0

## 2023-05-11 ENCOUNTER — Other Ambulatory Visit: Payer: Self-pay

## 2023-05-18 ENCOUNTER — Other Ambulatory Visit: Payer: Self-pay

## 2023-05-20 ENCOUNTER — Other Ambulatory Visit (HOSPITAL_COMMUNITY): Payer: Self-pay

## 2023-05-20 ENCOUNTER — Other Ambulatory Visit: Payer: Self-pay

## 2023-06-01 ENCOUNTER — Encounter: Payer: Self-pay | Admitting: Neurology

## 2023-06-01 ENCOUNTER — Ambulatory Visit: Payer: Commercial Managed Care - PPO | Admitting: Neurology

## 2023-06-01 VITALS — BP 123/75 | HR 95 | Ht 67.0 in | Wt 148.0 lb

## 2023-06-01 DIAGNOSIS — R200 Anesthesia of skin: Secondary | ICD-10-CM | POA: Diagnosis not present

## 2023-06-01 DIAGNOSIS — Z3009 Encounter for other general counseling and advice on contraception: Secondary | ICD-10-CM | POA: Diagnosis not present

## 2023-06-01 DIAGNOSIS — G35 Multiple sclerosis: Secondary | ICD-10-CM

## 2023-06-01 DIAGNOSIS — E559 Vitamin D deficiency, unspecified: Secondary | ICD-10-CM | POA: Diagnosis not present

## 2023-06-01 DIAGNOSIS — R5383 Other fatigue: Secondary | ICD-10-CM | POA: Diagnosis not present

## 2023-06-01 NOTE — Progress Notes (Signed)
 [facility]  PATIENT: Penny Hansen DOB: December 25, 1985  REFERRING DOCTOR OR PCP: Vernona Rieger, NP SOURCE: Patient, notes from PCP, MRI images personally reviewed.  _________________________________   HISTORICAL  CHIEF COMPLAINT:  Chief Complaint  Patient presents with   Multiple Sclerosis    RM11, ALONE, FOLLOWUP: doing well on medication. No ms flareups recently. Pt mentioned fatigued. 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    Update 05/31/2022:  She is on Glatopa.  She is tolerating it well.  She has not had any definite MS exacerbations.     She chose Copaxone as she is hoping to become pregnant this year (fiance has reversed vasectomy).    She is trying to get pregnant   She is doing IVF and just had an implantation last week.   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     She is PAl-1 homozygote (4G/4G)  She is neurologically stable and is active.   She is having no difficulty with gait, balance, strength or sensation.  Bladder function is fine.  Vision is fine.   She has fatigue. Armodafinil 100 mg helped but due to desire to get pregnant, she has d/c  She is physically active and exercises.   Mood and cognition are doing well.  She takes Vit D supplements for deficiency.    MS history: In June 2020, she had an MRI of the brain as part of a "dummy run" for an MS study.  It was abnormal showing foci consistent with MS.  In retrospect, back in 2014, she had some tingling in the right flank that lasted about 3 days.   She was working long shifts so assumed it was a muscle strain.  She also has had some sensory symptoms in the right arm.  She had additional MRI of the spine 10/11/2018 that showed a normal spinal cord.  Repeat MRI of the brain was performed 05/05/2019 and showed no new lesions.  Repeat MRI of  the brain 03/28/2020 showed 2 new foci, one of the periventricular splenium of the corpus callosum and another in the right frontal lobe.  She then had a lumbar puncture 04/12/2020.  It was abnormal showing oligoclonal bands and elevated IgG index. She started glatiramer February 2022.  Imaging and other studies: MRI 09/13/2018 showed T2/FLAIR hyperintense foci in the hemispheres including the periventricular and juxtacortical white matter.  None of the foci look to be acute.  MRI brain 10/11/2018 was unchanged.  Normal enhancement pattern.  MRI of the cervical and thoracic spine 10/11/2018 showed a normal spinal cord.  MRI of the brain 05/05/2019 showed no new lesions  MRI of the brain 03/28/2020 showed 2 new foci, 1 in the periventricular splenium and another in the right frontal lobe.  These do not enhance.  Echocardiogram with bubbles 09/30/2018 showed some bubbles crossing late (not c/w ASD/PFO but could be pulmonary AVMs.   Follow up transcranial dopplers with agitated saline showed no bubbles.     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   REVIEW OF SYSTEMS: Constitutional: No fevers, chills, sweats, or change in appetite Eyes: No visual changes, double vision, eye pain Ear, nose and throat: No hearing loss, ear pain, nasal congestion,  sore throat Cardiovascular: No chest pain, palpitations Respiratory:  No shortness of breath at rest or with exertion.   No wheezes GastrointestinaI: No nausea, vomiting, diarrhea, abdominal pain, fecal incontinence Genitourinary:  No dysuria, urinary retention or frequency.  No nocturia. Musculoskeletal:  No neck pain. SI pain on left Integumentary: No rash, pruritus, skin lesions Neurological: as above Psychiatric: No depression at this time.  No anxiety Endocrine: No palpitations, diaphoresis, change in appetite, change in weigh or increased  thirst Hematologic/Lymphatic:  No anemia, purpura, petechiae. Allergic/Immunologic: No itchy/runny eyes, nasal congestion, recent allergic reactions, rashes  ALLERGIES: Allergies  Allergen Reactions   Amoxicillin-Pot Clavulanate Other (See Comments)    Gi upset    HOME MEDICATIONS:  Current Outpatient Medications:    albuterol (VENTOLIN HFA) 108 (90 Base) MCG/ACT inhaler, Inhale 2 puffs into the lungs every 6 (six) hours as needed for wheezing or shortness of breath., Disp: 1 each, Rfl: 0   Alcohol Swabs (EASY TOUCH ALCOHOL PREP MEDIUM) 70 % PADS, Use 1 swab daily to clean skin prior to progesterone injection, Disp: 30 each, Rfl: 3   Ascorbic Acid (VITAMIN C) 100 MG tablet, Take 100 mg by mouth as directed., Disp: , Rfl:    aspirin 81 MG chewable tablet, Chew 81 mg by mouth daily., Disp: , Rfl:    Cholecalciferol (VITAMIN D-3) 125 MCG (5000 UT) TABS, , Disp: , Rfl:    estradiol (ESTRACE) 2 MG tablet, Take 1 tablet (2 mg total) by mouth 2 (two) times daily., Disp: 60 tablet, Rfl: 3   estradiol (VIVELLE-DOT) 0.1 MG/24HR patch, Place 1 patch (0.1 mg total) onto the skin every 3 (three) days., Disp: 8 patch, Rfl: 3   Glatiramer Acetate 40 MG/ML SOSY, INJECT 40 MG UNDER THE SKIN THREE TIMES A WEEK, Disp: 12 mL, Rfl: 1   heparin 5000 UNIT/ML injection, Inject 5,000 Units into the skin 2 (two) times daily., Disp: , Rfl:    hydrOXYzine (ATARAX) 10 MG tablet, Take 1-2 tablets (10-20 mg total) by mouth 2 (two) times daily as needed for anxiety., Disp: 30 tablet, Rfl: 0   methylPREDNISolone (MEDROL) 8 MG tablet, Take 1 tablet (8 mg total) by mouth 2 (two) times daily for 4 days when instructed by MD., Disp: 8 tablet, Rfl: 3   Multiple Vitamins-Minerals (MULTIVITAMIN GUMMIES WOMENS) CHEW, Chew by mouth as directed., Disp: , Rfl:    NEEDLE, DISP, 18 G 18G X 1-1/2" MISC, Use 1 needle attached to syringe to draw out progesterone injection, Disp: 30 each, Rfl: 3   NEEDLE, DISP, 22 G 22G X 1-1/2" MISC, Use  1 needle to inject progesterone intramuscularly, Disp: 30 each, Rfl: 3   ondansetron (ZOFRAN ODT) 4 MG disintegrating tablet, Take 1 tablet (4 mg total) by mouth every 8 (eight) hours as needed for nausea or vomiting., Disp: 15 tablet, Rfl: 0   predniSONE (DELTASONE) 10 MG tablet, Take 10 mg by mouth daily with breakfast., Disp: , Rfl:    progesterone (PROMETRIUM) 200 MG capsule, Take 1 capsule (200 mg total) by mouth at bedtime., Disp: 14 capsule, Rfl: 0   progesterone 50 MG/ML injection, Inject 1 mL (50 mg total) into the muscle daily when instructed by MD., Disp: 30 mL, Rfl: 3   Syringe, Disposable, 3 ML MISC, use 1 syringe daily to draw up progesterone injection, Disp: 30 each, Rfl: 3   SYRINGE-NEEDLE, DISP, 3 ML (B-D 3CC LUER-LOK SYR 18GX1-1/2) 18G X 1-1/2" 3 ML MISC, Use to draw out Progesterone, Disp: 30 each,  Rfl: 3  PAST MEDICAL HISTORY: Past Medical History:  Diagnosis Date   Asthma    GERD (gastroesophageal reflux disease)    silent GERD which has caused cough in the past   Multiple sclerosis (HCC)    Numbness 09/20/2018    PAST SURGICAL HISTORY: Past Surgical History:  Procedure Laterality Date   IR EMBO VENOUS NOT HEMORR HEMANG  INC GUIDE ROADMAPPING  05/16/2021   TONSILECTOMY/ADENOIDECTOMY WITH MYRINGOTOMY  2012   WISDOM TOOTH EXTRACTION  2010    FAMILY HISTORY: Family History  Problem Relation Age of Onset   Ulcerative colitis Mother        Living   Breast cancer Mother 85   Hyperlipidemia Father        Living   Breast cancer Sister 76   Healthy Sister        x2   Breast cancer Maternal Grandmother 32       breast cancer: lumpectomy   Cancer Maternal Grandmother        Breast   Heart disease Maternal Grandfather        MI; dissecting aneurysm   Heart attack Maternal Grandfather    Hypertension Maternal Grandfather    Breast cancer Paternal Grandmother 82       breast cancer with mastectomy, chemo and radiation   Diabetes Paternal Grandmother    Cancer  Paternal Grandmother        Breast   Prostate cancer Paternal Grandfather     SOCIAL HISTORY:  Social History   Socioeconomic History   Marital status: Significant Other    Spouse name: Not on file   Number of children: 0   Years of education: Bachelors   Highest education level: Not on file  Occupational History   Occupation: [employer]  Tobacco Use   Smoking status: Never   Smokeless tobacco: Never  Vaping Use   Vaping status: Never Used  Substance and Sexual Activity   Alcohol use: Yes    Alcohol/week: 0.0 standard drinks of alcohol    Comment: rare   Drug use: No   Sexual activity: Yes    Birth control/protection: Pill  Other Topics Concern   Not on file  Social History Narrative   Works as Regulatory affairs officer at [employer]   Married   No children   Moved to [place] in 2007 from [place]   Enjoys relaxing.    Right handed    Caffeine use: Coffee every day   Tea very rare   Social Drivers of Corporate investment banker Strain: Low Risk  (06/26/2022)   [facility] from [facility], [facility]   Overall Financial Resource Strain (CARDIA)    Difficulty of Paying Living Expenses: Not hard at all  Food Insecurity: No Food Insecurity (06/26/2022)   [facility], [facility]   Hunger Vital Sign    Worried About Running Out of Food in the Last Year: Never true    Ran Out of Food in the Last Year: Never true  Transportation Needs: No Transportation Needs (06/26/2022)   [facility]   PRAPARE - Transportation    Lack of Transportation (Medical): No    Lack of Transportation (Non-Medical): No  Physical Activity: Not on file  Stress: Not on file  Social Connections: Unknown (04/24/2022)   Received from [facility], [facility]   Social Network    Social Network: Not on file  Intimate Partner Violence: Unknown (04/24/2022)   [facility]  [facility]   HITS    Physically Hurt: Not on  file    Insult or Talk Down To: Not on file    Threaten Physical Harm: Not on file    Scream or Curse: Not on file     PHYSICAL EXAM  Vitals:   06/01/23 1040  BP: 123/75  Pulse: 95  Weight: 148 lb (67.1 kg)  Height: 5\' 7"  (1.702 m)     Body mass index is 23.18 kg/m.   General: The patient is well-developed and well-nourished and in no acute distress.     Neurologic Exam  Mental status: The patient is alert and oriented at the time of the examination. The patient has apparent normal recent and remote memory, with an apparently normal attention span and concentration ability.   Speech is normal.  Cranial nerves: Extraocular movements are full.There is good facial sensation to soft touch bilaterally.Facial strength is normal.  Trapezius and sternocleidomastoid strength is normal. No dysarthria is noted. No obvious hearing deficits are noted.  Motor:  Muscle bulk is normal.   Muscle tone is normal.  Strength is normal in the arms and legs.  Sensory: No Phalen's or Tinel's signs.  Sensory testing is intact to pinprick, soft touch and vibration sensation in all 4 extremities.  Coordination: Cerebellar testing reveals good finger-nose-finger and heel-to-shin bilaterally.  Gait and station: Station is normal.   The gait and tandem gait are normal.  Romberg is negative..  Reflexes: Deep tendon reflexes are symmetric and normal bilaterally.         ASSESSMENT AND PLAN  Relapsing remitting multiple sclerosis (HCC)  Other fatigue  Numbness  Vitamin D deficiency  Family planning counseling   1.   She will continue glatiramer for now.  We did discuss that she can go down from 3 times a week to 2 times a week to have fewer injections.  We discussed that since her MS is mild there is a high likelihood she could do well off the drug for the whole pregnancy though of course we cannot be certain.  We also discussed that the second half of pregnancy MS becomes less active and  many people do fine off of their disease modifying therapy.    2.   After pregnancy/delivery can go back on Nuvigil 3.   Return to see me in 7-8 months or sooner for new or worsening neurologic symptoms.    Jaida Basurto A. Epimenio Foot, MD, [facility] 06/01/2023, 11:15 AM Certified in Neurology, Clinical Neurophysiology, Sleep Medicine and Neuroimaging  [facility] [address], [address] [place], [place] [address] [phone]

## 2023-06-05 DIAGNOSIS — Z3200 Encounter for pregnancy test, result unknown: Secondary | ICD-10-CM | POA: Diagnosis not present

## 2023-06-08 ENCOUNTER — Other Ambulatory Visit: Payer: Self-pay | Admitting: Internal Medicine

## 2023-06-08 DIAGNOSIS — Z3200 Encounter for pregnancy test, result unknown: Secondary | ICD-10-CM | POA: Diagnosis not present

## 2023-06-09 ENCOUNTER — Other Ambulatory Visit: Payer: Self-pay | Admitting: Pharmacist

## 2023-06-09 ENCOUNTER — Other Ambulatory Visit: Payer: Self-pay

## 2023-06-09 ENCOUNTER — Other Ambulatory Visit (HOSPITAL_COMMUNITY): Payer: Self-pay

## 2023-06-09 ENCOUNTER — Other Ambulatory Visit: Payer: Self-pay | Admitting: Neurology

## 2023-06-09 MED ORDER — GLATIRAMER ACETATE 40 MG/ML ~~LOC~~ SOSY
PREFILLED_SYRINGE | SUBCUTANEOUS | 5 refills | Status: DC
  Filled 2023-06-09: qty 12, fill #0

## 2023-06-09 MED ORDER — GLATIRAMER ACETATE 40 MG/ML ~~LOC~~ SOSY
PREFILLED_SYRINGE | SUBCUTANEOUS | 5 refills | Status: DC
  Filled 2023-06-09: qty 12, fill #0
  Filled 2023-06-09: qty 12, 28d supply, fill #0
  Filled 2023-07-02: qty 12, 28d supply, fill #1
  Filled 2023-08-03: qty 12, 28d supply, fill #2
  Filled 2023-08-26: qty 12, 28d supply, fill #3
  Filled 2023-09-18: qty 12, 28d supply, fill #4
  Filled 2023-10-22 – 2023-10-28 (×2): qty 12, 28d supply, fill #5

## 2023-06-09 NOTE — Progress Notes (Signed)
 Specialty Pharmacy Refill Coordination Note  Penny Hansen is a 38 y.o. female contacted today regarding refills of specialty medication(s) Glatiramer Acetate   Patient requested (Patient-Rptd) Pickup at [facility] at [facility] date:  (will fill for 3.6 pick up)   Medication will be filled on 03.05.25.

## 2023-06-10 ENCOUNTER — Other Ambulatory Visit: Payer: Self-pay

## 2023-06-10 DIAGNOSIS — Z3200 Encounter for pregnancy test, result unknown: Secondary | ICD-10-CM | POA: Diagnosis not present

## 2023-06-11 ENCOUNTER — Other Ambulatory Visit: Payer: Self-pay

## 2023-06-11 ENCOUNTER — Other Ambulatory Visit (HOSPITAL_COMMUNITY): Payer: Self-pay

## 2023-06-12 ENCOUNTER — Other Ambulatory Visit (HOSPITAL_COMMUNITY): Payer: Self-pay

## 2023-06-22 DIAGNOSIS — Z3200 Encounter for pregnancy test, result unknown: Secondary | ICD-10-CM | POA: Diagnosis not present

## 2023-06-23 ENCOUNTER — Other Ambulatory Visit (HOSPITAL_COMMUNITY): Payer: Self-pay

## 2023-06-29 DIAGNOSIS — O021 Missed abortion: Secondary | ICD-10-CM | POA: Diagnosis not present

## 2023-06-29 DIAGNOSIS — Z3200 Encounter for pregnancy test, result unknown: Secondary | ICD-10-CM | POA: Diagnosis not present

## 2023-07-02 ENCOUNTER — Other Ambulatory Visit: Payer: Self-pay

## 2023-07-02 NOTE — Progress Notes (Signed)
 Specialty Pharmacy Refill Coordination Note  Penny Hansen is a 38 y.o. female contacted today regarding refills of specialty medication(s) Glatiramer Acetate   Patient requested (Patient-Rptd) Pickup at [facility] at [facility] date: (Patient-Rptd) 07/06/23   Medication will be filled on 03.28.25.

## 2023-07-03 ENCOUNTER — Other Ambulatory Visit: Payer: Self-pay

## 2023-07-03 DIAGNOSIS — O021 Missed abortion: Secondary | ICD-10-CM | POA: Diagnosis not present

## 2023-07-20 DIAGNOSIS — Z319 Encounter for procreative management, unspecified: Secondary | ICD-10-CM | POA: Diagnosis not present

## 2023-08-03 ENCOUNTER — Other Ambulatory Visit: Payer: Self-pay

## 2023-08-03 ENCOUNTER — Other Ambulatory Visit: Payer: Self-pay | Admitting: Pharmacy Technician

## 2023-08-03 NOTE — Progress Notes (Signed)
 Specialty Pharmacy Refill Coordination Note  Penny Hansen is a 38 y.o. female contacted today regarding refills of specialty medication(s) Glatiramer  Acetate   Patient requested Delivery   Delivery date: 08/07/23   Verified address: [address]  [place]   Medication will be filled on 08/06/23.

## 2023-08-06 ENCOUNTER — Other Ambulatory Visit: Payer: Self-pay

## 2023-08-26 ENCOUNTER — Other Ambulatory Visit: Payer: Self-pay

## 2023-08-26 NOTE — Progress Notes (Signed)
 Specialty Pharmacy Refill Coordination Note  Penny Hansen is a 38 y.o. female contacted today regarding refills of specialty medication(s) Glatiramer  Acetate   Patient requested (Patient-Rptd) Delivery   Delivery date: 09/02/23   Verified address: (Patient-Rptd) [address] [place] [address]   Medication will be filled on 09/01/23.

## 2023-09-01 ENCOUNTER — Other Ambulatory Visit: Payer: Self-pay

## 2023-09-09 ENCOUNTER — Other Ambulatory Visit (HOSPITAL_COMMUNITY): Payer: Self-pay

## 2023-09-09 ENCOUNTER — Other Ambulatory Visit: Payer: Self-pay

## 2023-09-09 ENCOUNTER — Other Ambulatory Visit (HOSPITAL_BASED_OUTPATIENT_CLINIC_OR_DEPARTMENT_OTHER): Payer: Self-pay

## 2023-09-09 DIAGNOSIS — Z319 Encounter for procreative management, unspecified: Secondary | ICD-10-CM | POA: Diagnosis not present

## 2023-09-09 DIAGNOSIS — Z3183 Encounter for assisted reproductive fertility procedure cycle: Secondary | ICD-10-CM | POA: Diagnosis not present

## 2023-09-09 MED ORDER — DIAZEPAM 5 MG PO TABS
5.0000 mg | ORAL_TABLET | Freq: Once | ORAL | 3 refills | Status: AC
  Filled 2023-09-09: qty 1, 1d supply, fill #0

## 2023-09-09 MED ORDER — PREDNISONE 10 MG PO TABS
10.0000 mg | ORAL_TABLET | Freq: Every day | ORAL | 3 refills | Status: AC
  Filled 2023-09-09: qty 30, 30d supply, fill #0
  Filled 2023-11-03: qty 30, 30d supply, fill #1

## 2023-09-09 MED ORDER — ESTRADIOL 0.1 MG/24HR TD PTTW
2.0000 | MEDICATED_PATCH | TRANSDERMAL | 3 refills | Status: AC
  Filled 2023-09-09 – 2023-09-19 (×4): qty 16, 24d supply, fill #0
  Filled 2023-10-08 (×2): qty 8, 28d supply, fill #0
  Filled 2023-11-03: qty 8, 28d supply, fill #1
  Filled 2023-12-08: qty 8, 28d supply, fill #2
  Filled 2024-01-20: qty 8, 28d supply, fill #3
  Filled 2024-02-13: qty 8, 28d supply, fill #4
  Filled 2024-03-15: qty 8, 28d supply, fill #5

## 2023-09-10 ENCOUNTER — Other Ambulatory Visit: Payer: Self-pay

## 2023-09-10 ENCOUNTER — Other Ambulatory Visit (HOSPITAL_COMMUNITY): Payer: Self-pay

## 2023-09-11 ENCOUNTER — Other Ambulatory Visit (HOSPITAL_COMMUNITY): Payer: Self-pay

## 2023-09-11 ENCOUNTER — Other Ambulatory Visit: Payer: Self-pay

## 2023-09-11 MED ORDER — ZARXIO 300 MCG/0.5ML IJ SOSY
300.0000 ug/kg | PREFILLED_SYRINGE | INTRAMUSCULAR | 2 refills | Status: AC
  Filled 2023-09-11 – 2023-09-28 (×2): qty 1, 7d supply, fill #0

## 2023-09-15 ENCOUNTER — Other Ambulatory Visit (HOSPITAL_COMMUNITY): Payer: Self-pay

## 2023-09-15 ENCOUNTER — Other Ambulatory Visit: Payer: Self-pay

## 2023-09-16 ENCOUNTER — Other Ambulatory Visit (HOSPITAL_COMMUNITY): Payer: Self-pay

## 2023-09-16 ENCOUNTER — Other Ambulatory Visit: Payer: Self-pay

## 2023-09-16 MED ORDER — NORETHINDRONE ACETATE 5 MG PO TABS
5.0000 mg | ORAL_TABLET | Freq: Every day | ORAL | 0 refills | Status: DC
  Filled 2023-09-16: qty 14, 14d supply, fill #0

## 2023-09-17 ENCOUNTER — Other Ambulatory Visit (HOSPITAL_COMMUNITY): Payer: Self-pay

## 2023-09-17 ENCOUNTER — Other Ambulatory Visit: Payer: Self-pay

## 2023-09-18 ENCOUNTER — Other Ambulatory Visit: Payer: Self-pay

## 2023-09-18 NOTE — Progress Notes (Signed)
 Specialty Pharmacy Refill Coordination Note  Penny Hansen is a 38 y.o. female contacted today regarding refills of specialty medication(s) Glatiramer  Acetate   Patient requested Delivery   Delivery date: 09/23/23   Verified address: [address] [place] [address]   Medication will be filled on 06.17.25.   Patient traveling out of town on 6.20.25, sending early

## 2023-09-19 ENCOUNTER — Other Ambulatory Visit (HOSPITAL_COMMUNITY): Payer: Self-pay

## 2023-09-28 ENCOUNTER — Encounter (HOSPITAL_COMMUNITY): Payer: Self-pay

## 2023-09-28 ENCOUNTER — Other Ambulatory Visit (HOSPITAL_COMMUNITY): Payer: Self-pay

## 2023-10-07 DIAGNOSIS — N858 Other specified noninflammatory disorders of uterus: Secondary | ICD-10-CM | POA: Diagnosis not present

## 2023-10-07 DIAGNOSIS — N840 Polyp of corpus uteri: Secondary | ICD-10-CM | POA: Diagnosis not present

## 2023-10-08 ENCOUNTER — Other Ambulatory Visit (HOSPITAL_COMMUNITY): Payer: Self-pay

## 2023-10-08 ENCOUNTER — Encounter (HOSPITAL_COMMUNITY): Payer: Self-pay

## 2023-10-08 MED ORDER — NORETHINDRONE ACETATE 5 MG PO TABS
5.0000 mg | ORAL_TABLET | Freq: Every day | ORAL | 0 refills | Status: AC
  Filled 2023-10-08: qty 30, 30d supply, fill #0

## 2023-10-08 MED ORDER — NORETHINDRONE ACETATE 5 MG PO TABS: 5.0000 mg | ORAL_TABLET | Freq: Every day | ORAL | 1 refills | Status: AC

## 2023-10-20 ENCOUNTER — Other Ambulatory Visit (HOSPITAL_COMMUNITY): Payer: Self-pay

## 2023-10-22 ENCOUNTER — Other Ambulatory Visit: Payer: Self-pay

## 2023-10-22 ENCOUNTER — Telehealth: Payer: Self-pay

## 2023-10-22 ENCOUNTER — Other Ambulatory Visit (HOSPITAL_COMMUNITY): Payer: Self-pay

## 2023-10-22 ENCOUNTER — Other Ambulatory Visit: Payer: Self-pay | Admitting: Pharmacy Technician

## 2023-10-22 NOTE — Telephone Encounter (Signed)
 Pharmacy Patient Advocate Encounter   Received notification from Patient Pharmacy that prior authorization for Glatiramer  Acetate 40MG /ML syringes is required/requested.   Insurance verification completed.   The patient is insured through [facility] .   Per test claim: PA required; PA submitted to above mentioned insurance via CoverMyMeds Key/confirmation #/EOC [facility] Status is pending

## 2023-10-26 ENCOUNTER — Other Ambulatory Visit (HOSPITAL_COMMUNITY): Payer: Self-pay

## 2023-10-26 NOTE — Telephone Encounter (Signed)
 Pharmacy Patient Advocate Encounter  Received notification from [facility] that Prior Authorization for Glatiramer  Acetate 40MG /ML syringes has been APPROVED from 10/22/2023 to 10/20/2024   PA #/Case ID/Reference #: PA Case ID #: [id number]

## 2023-10-28 ENCOUNTER — Other Ambulatory Visit: Payer: Self-pay

## 2023-10-28 ENCOUNTER — Other Ambulatory Visit (HOSPITAL_COMMUNITY): Payer: Self-pay

## 2023-10-28 NOTE — Progress Notes (Signed)
 Specialty Pharmacy Refill Coordination Note  Spoke with Penny Hansen is a 38 y.o. female contacted today regarding refills of specialty medication(s) Glatiramer  Acetate  Doses on hand: 2-3  Injection date: 11/04/23   Patient requested: Delivery   Delivery date: 10/30/23   Verified address: [address] [place] [address]  Medication will be filled on 10/29/23.

## 2023-11-03 DIAGNOSIS — Z3183 Encounter for assisted reproductive fertility procedure cycle: Secondary | ICD-10-CM | POA: Diagnosis not present

## 2023-11-04 ENCOUNTER — Telehealth: Payer: Self-pay | Admitting: Pharmacist

## 2023-11-04 NOTE — Telephone Encounter (Signed)
 Called patient to schedule an appointment for the [facility] [employer] Specialty Medication [facility]. I was unable to reach the patient so I left a HIPAA-compliant message requesting that the patient return my call.   Butch Penny, PharmD, P[place]y [place], CPP Clinical Pharmacist [facility] [phone]

## 2023-11-06 ENCOUNTER — Ambulatory Visit: Admitting: Primary Care

## 2023-11-06 ENCOUNTER — Encounter: Payer: Self-pay | Admitting: Primary Care

## 2023-11-06 VITALS — BP 142/60 | HR 94 | Temp 98.4°F | Ht 67.0 in | Wt 151.0 lb

## 2023-11-06 DIAGNOSIS — G35 Multiple sclerosis: Secondary | ICD-10-CM | POA: Diagnosis not present

## 2023-11-06 DIAGNOSIS — Z9189 Other specified personal risk factors, not elsewhere classified: Secondary | ICD-10-CM | POA: Diagnosis not present

## 2023-11-06 DIAGNOSIS — F411 Generalized anxiety disorder: Secondary | ICD-10-CM

## 2023-11-06 DIAGNOSIS — J4520 Mild intermittent asthma, uncomplicated: Secondary | ICD-10-CM | POA: Diagnosis not present

## 2023-11-06 DIAGNOSIS — Z13220 Encounter for screening for lipoid disorders: Secondary | ICD-10-CM

## 2023-11-06 DIAGNOSIS — Z0000 Encounter for general adult medical examination without abnormal findings: Secondary | ICD-10-CM | POA: Diagnosis not present

## 2023-11-06 DIAGNOSIS — Z1231 Encounter for screening mammogram for malignant neoplasm of breast: Secondary | ICD-10-CM

## 2023-11-06 LAB — CBC
HCT: 40.5 % (ref 36.0–46.0)
Hemoglobin: 14 g/dL (ref 12.0–15.0)
MCHC: 34.6 g/dL (ref 30.0–36.0)
MCV: 88.7 fl (ref 78.0–100.0)
Platelets: 329 K/uL (ref 150.0–400.0)
RBC: 4.56 Mil/uL (ref 3.87–5.11)
RDW: 12.5 % (ref 11.5–15.5)
WBC: 6.8 K/uL (ref 4.0–10.5)

## 2023-11-06 LAB — LIPID PANEL
Cholesterol: 133 mg/dL (ref 0–200)
HDL: 62.7 mg/dL (ref >39.00)
LDL Cholesterol: 60 mg/dL (ref 0–99)
NonHDL: 70.62
Total CHOL/HDL Ratio: 2
Triglycerides: 55 mg/dL (ref 0.0–149.0)
VLDL: 11 mg/dL (ref 0.0–40.0)

## 2023-11-06 LAB — COMPREHENSIVE METABOLIC PANEL WITH GFR
ALT: 21 U/L (ref 0–35)
AST: 18 U/L (ref 0–37)
Albumin: 4.6 g/dL (ref 3.5–5.2)
Alkaline Phosphatase: 29 U/L — ABNORMAL LOW (ref 39–117)
BUN: 16 mg/dL (ref 6–23)
CO2: 25 meq/L (ref 19–32)
Calcium: 9.5 mg/dL (ref 8.4–10.5)
Chloride: 101 meq/L (ref 96–112)
Creatinine, Ser: 0.79 mg/dL (ref 0.40–1.20)
GFR: 96.49 mL/min (ref >60.00)
Glucose, Bld: 105 mg/dL — ABNORMAL HIGH (ref 70–99)
Potassium: 4.5 meq/L (ref 3.5–5.1)
Sodium: 135 meq/L (ref 135–145)
Total Bilirubin: 0.4 mg/dL (ref 0.2–1.2)
Total Protein: 7.5 g/dL (ref 6.0–8.3)

## 2023-11-06 NOTE — Assessment & Plan Note (Signed)
Stable overall. No concerns today.  Continue to monitor.

## 2023-11-06 NOTE — Assessment & Plan Note (Signed)
 Mammogram orders placed.

## 2023-11-06 NOTE — Patient Instructions (Signed)
 Stop by the lab prior to leaving today. I will notify you of your results once received.   Call [facility] to schedule your mammogram.   It was a pleasure to see you today!

## 2023-11-06 NOTE — Progress Notes (Signed)
 Subjective:    Patient ID: Penny Hansen, female    DOB: 1985/06/06, 38 y.o.   MRN: [id number]  HPI  Penny Hansen is a very pleasant 38 y.o. female who presents today for complete physical and follow up of chronic conditions.  Immunizations: -Tetanus: Completed >10 years ago   Diet: Fair diet.  Exercise: No regular exercise.  Eye exam: Completes annually  Dental exam: Completes semi-annually    Pap Smear: Completed per GYN, UTD Mammogram: Completed in June 2024. Completed MR breasts in December 2023  Wt Readings from Last 3 Encounters:  11/06/23 151 lb (68.5 kg)  06/01/23 148 lb (67.1 kg)  11/17/22 146 lb (66.2 kg)      Review of Systems  Constitutional:  Negative for unexpected weight change.  HENT:  Negative for rhinorrhea.   Respiratory:  Negative for cough and shortness of breath.   Cardiovascular:  Negative for chest pain.  Gastrointestinal:  Negative for constipation and diarrhea.  Genitourinary:  Negative for difficulty urinating.  Musculoskeletal:  Negative for arthralgias and myalgias.  Skin:  Negative for rash.  Allergic/Immunologic: Negative for environmental allergies.  Neurological:  Negative for dizziness and headaches.  Psychiatric/Behavioral:  The patient is not nervous/anxious.          Past Medical History:  Diagnosis Date   Asthma    GERD (gastroesophageal reflux disease)    silent GERD which has caused cough in the past   Multiple sclerosis (HCC)    Numbness 09/20/2018    Social History   Socioeconomic History   Marital status: Significant Other    Spouse name: Not on file   Number of children: 0   Years of education: Bachelors   Highest education level: Not on file  Occupational History   Occupation: [employer]  Tobacco Use   Smoking status: Never   Smokeless tobacco: Never  Vaping Use   Vaping status: Never Used  Substance and Sexual Activity   Alcohol  use: Yes    Alcohol /week: 0.0 standard drinks of  alcohol     Comment: rare   Drug use: No   Sexual activity: Yes    Birth control/protection: Pill  Other Topics Concern   Not on file  Social History Narrative   Works as Regulatory affairs officer at [employer]   Married   No children   Moved to [place] in 2007 from [place]   Enjoys relaxing.    Right handed    Caffeine  use: Coffee every day   Tea very rare   Social Drivers of Corporate investment banker Strain: Low Risk  (06/26/2022)   [facility]   Overall Financial Resource Strain (CARDIA)    Difficulty of Paying Living Expenses: Not hard at all  Food Insecurity: No Food Insecurity (06/26/2022)   Received from [facility]   Hunger Vital Sign    Within the past 12 months, you worried that your food would run out before you got the money to buy more.: Never true    Within the past 12 months, the food you bought just didn't last and you didn't have money to get more.: Never true  Transportation Needs: No Transportation Needs (06/26/2022)   Received from [facility] - Transportation    Lack of Transportation (Medical): No    Lack of Transportation (Non-Medical): No  Physical Activity: Not on file  Stress: Not on file  Social Connections: Unknown (04/24/2022)   Received from [facility]   Social  Network    Social Network: Not on file  Intimate Partner Violence: Unknown (04/24/2022)   Received from [facility]   HITS    Physically Hurt: Not on file    Insult or Talk Down To: Not on file    Threaten Physical Harm: Not on file    Scream or Curse: Not on file    Past Surgical History:  Procedure Laterality Date   IR EMBO VENOUS NOT HEMORR HEMANG  INC GUIDE ROADMAPPING  05/16/2021   TONSILECTOMY/ADENOIDECTOMY WITH MYRINGOTOMY  2012   WISDOM TOOTH EXTRACTION  2010    Family History  Problem Relation Age of Onset   Ulcerative colitis Mother        Living   Breast cancer Mother 72   Hyperlipidemia Father        Living   Breast cancer  Sister 29   Healthy Sister        x2   Breast cancer Maternal Grandmother 51       breast cancer: lumpectomy   Cancer Maternal Grandmother        Breast   Heart disease Maternal Grandfather        MI; dissecting aneurysm   Heart attack Maternal Grandfather    Hypertension Maternal Grandfather    Breast cancer Paternal Grandmother 41       breast cancer with mastectomy, chemo and radiation   Diabetes Paternal Grandmother    Cancer Paternal Grandmother        Breast   Prostate cancer Paternal Grandfather     Allergies  Allergen Reactions   Amoxicillin-Pot Clavulanate Other (See Comments)    Gi upset    Current Outpatient Medications on File Prior to Visit  Medication Sig Dispense Refill   albuterol  (VENTOLIN  HFA) 108 (90 Base) MCG/ACT inhaler Inhale 2 puffs into the lungs every 6 (six) hours as needed for wheezing or shortness of breath. 1 each 0   Alcohol  Swabs  (EASY TOUCH ALCOHOL  PREP MEDIUM) 70 % PADS Use 1 swab daily to clean skin prior to progesterone  injection 30 each 3   aspirin 81 MG chewable tablet Chew 81 mg by mouth daily.     estradiol  (ESTRACE ) 2 MG tablet Take 1 tablet (2 mg total) by mouth 2 (two) times daily. 60 tablet 3   estradiol  (VIVELLE -DOT) 0.1 MG/24HR patch Place 2 patches (0.2 mg total) onto the skin every 3 (three) days.as directed. 16 patch 3   filgrastim -sndz (ZARXIO ) 300 MCG/0.5ML SOSY injection inject 354mcg/0.5mL 1-4 hours prior to FET, repeat in 1 week 1 mL 2   Glatiramer  Acetate 40 MG/ML SOSY INJECT 40 MG UNDER THE SKIN THREE TIMES A WEEK 12 mL 5   Multiple Vitamins-Minerals (MULTIVITAMIN GUMMIES WOMENS) CHEW Chew by mouth as directed.     NEEDLE, DISP, 18 G 18G X 1-1/2 MISC Use 1 needle attached to syringe to draw out progesterone  injection 30 each 3   NEEDLE, DISP, 22 G 22G X 1-1/2 MISC Use 1 needle to inject progesterone  intramuscularly 30 each 3   norethindrone  (AYGESTIN ) 5 MG tablet Take 1 tablet (5 mg total) by mouth daily. 30 tablet 0    norethindrone  (AYGESTIN ) 5 MG tablet Take 1 tablet (5 mg total) by mouth daily. 28 tablet 1   ondansetron  (ZOFRAN  ODT) 4 MG disintegrating tablet Take 1 tablet (4 mg total) by mouth every 8 (eight) hours as needed for nausea or vomiting. 15 tablet 0   predniSONE  (DELTASONE ) 10 MG tablet Take 10 mg by mouth daily  with breakfast.     predniSONE  (DELTASONE ) 10 MG tablet Take 1 tablet (10 mg total) by mouth daily as directed. 30 tablet 3   Prenatal Vit-Fe Fumarate-FA (PRENATAL MULTIVITAMIN) TABS tablet Take 1 tablet by mouth daily at 12 noon.     progesterone  (PROMETRIUM ) 200 MG capsule Take 1 capsule (200 mg total) by mouth at bedtime. 14 capsule 0   progesterone  50 MG/ML injection Inject 1 mL (50 mg total) into the muscle daily when instructed by MD. 30 mL 3   Syringe, Disposable, 3 ML MISC use 1 syringe daily to draw up progesterone  injection 30 each 3   SYRINGE-NEEDLE, DISP, 3 ML (B-D 3CC LUER-LOK SYR 18GX1-1/2) 18G X 1-1/2 3 ML MISC Use to draw out Progesterone  30 each 3   Ascorbic Acid (VITAMIN C) 100 MG tablet Take 100 mg by mouth as directed. (Patient not taking: Reported on 11/06/2023)     Cholecalciferol (VITAMIN D -3) 125 MCG (5000 UT) TABS  (Patient not taking: Reported on 11/06/2023)     heparin 5000 UNIT/ML injection Inject 5,000 Units into the skin 2 (two) times daily. (Patient not taking: Reported on 11/06/2023)     hydrOXYzine  (ATARAX ) 10 MG tablet Take 1-2 tablets (10-20 mg total) by mouth 2 (two) times daily as needed for anxiety. (Patient not taking: Reported on 11/06/2023) 30 tablet 0   methylPREDNISolone  (MEDROL ) 8 MG tablet Take 1 tablet (8 mg total) by mouth 2 (two) times daily for 4 days when instructed by MD. (Patient not taking: Reported on 11/06/2023) 8 tablet 3   No current facility-administered medications on file prior to visit.    BP (!) 142/60   Pulse 94   Temp 98.4 F (36.9 C) (Temporal)   Ht 5' 7 (1.702 m)   Wt 151 lb (68.5 kg)   LMP 09/19/2023 (Exact Date)   SpO2 99%    BMI 23.65 kg/m  Objective:   Physical Exam HENT:     Right Ear: Tympanic membrane and ear canal normal.     Left Ear: Tympanic membrane and ear canal normal.  Eyes:     Pupils: Pupils are equal, round, and reactive to light.  Cardiovascular:     Rate and Rhythm: Normal rate and regular rhythm.  Pulmonary:     Effort: Pulmonary effort is normal.     Breath sounds: Normal breath sounds.  Abdominal:     General: Bowel sounds are normal.     Palpations: Abdomen is soft.     Tenderness: There is no abdominal tenderness.  Musculoskeletal:        General: Normal range of motion.     Cervical back: Neck supple.  Skin:    General: Skin is warm and dry.  Neurological:     Mental Status: She is alert and oriented to person, place, and time.     Cranial Nerves: No cranial nerve deficit.     Deep Tendon Reflexes:     Reflex Scores:      Patellar reflexes are 2+ on the right side and 2+ on the left side. Psychiatric:        Mood and Affect: Mood normal.           Assessment & Plan:  Preventative health care Assessment & Plan: Tetanus due, she declines today. Pap smear UTD. Mammogram due, orders placed.  Discussed the importance of a healthy diet and regular exercise in order for weight loss, and to reduce the risk of further co-morbidity.  Exam stable. Labs  pending.  Follow up in 1 year for repeat physical.   Orders: -     Comprehensive metabolic panel with GFR -     Lipid panel -     CBC  Mild intermittent asthma without complication Assessment & Plan: Controlled.  Continue albuterol  inhaler PRN   Relapsing remitting multiple sclerosis (HCC) Assessment & Plan: Stable.  Following with neurology, office notes reviewed from February 2025. Continue glatiramer  40 mg three times weekly.   At high risk for breast cancer Assessment & Plan: Mammogram orders placed.    GAD (generalized anxiety disorder) Assessment & Plan: Stable overall. No concerns  today.  Continue to monitor.    Screening mammogram for breast cancer -     3D Screening Mammogram, Left and Right; Future        Comer MARLA Gaskins, NP

## 2023-11-06 NOTE — Assessment & Plan Note (Signed)
 Stable.  Following with neurology, office notes reviewed from February 2025. Continue glatiramer  40 mg three times weekly.

## 2023-11-06 NOTE — Assessment & Plan Note (Signed)
 Controlled.  Continue albuterol inhaler PRN

## 2023-11-06 NOTE — Assessment & Plan Note (Signed)
 Tetanus due, she declines today. Pap smear UTD. Mammogram due, orders placed.  Discussed the importance of a healthy diet and regular exercise in order for weight loss, and to reduce the risk of further co-morbidity.  Exam stable. Labs pending.  Follow up in 1 year for repeat physical.

## 2023-11-08 ENCOUNTER — Ambulatory Visit: Payer: Self-pay | Admitting: Primary Care

## 2023-11-09 DIAGNOSIS — N96 Recurrent pregnancy loss: Secondary | ICD-10-CM | POA: Diagnosis not present

## 2023-11-17 DIAGNOSIS — Z3200 Encounter for pregnancy test, result unknown: Secondary | ICD-10-CM | POA: Diagnosis not present

## 2023-11-19 ENCOUNTER — Other Ambulatory Visit: Payer: Self-pay | Admitting: Neurology

## 2023-11-19 ENCOUNTER — Other Ambulatory Visit: Payer: Self-pay

## 2023-11-19 ENCOUNTER — Other Ambulatory Visit: Payer: Self-pay | Admitting: Pharmacy Technician

## 2023-11-19 MED ORDER — GLATIRAMER ACETATE 40 MG/ML ~~LOC~~ SOSY
PREFILLED_SYRINGE | SUBCUTANEOUS | 2 refills | Status: DC
  Filled 2023-11-19: qty 12, fill #0

## 2023-11-19 NOTE — Progress Notes (Signed)
 Specialty Pharmacy Refill Coordination Note  Penny Hansen is a 38 y.o. female contacted today regarding refills of specialty medication(s) Glatiramer  Acetate   Patient requested Delivery   Delivery date: 11/25/23   Verified address: [address] [place]   Medication will be filled on 11/24/23.  This fill date is pending response to refill request from provider. Patient is aware and if they have not received fill by intended date they must follow up with pharmacy.

## 2023-11-26 ENCOUNTER — Other Ambulatory Visit: Payer: Self-pay

## 2023-11-27 ENCOUNTER — Ambulatory Visit: Attending: Primary Care | Admitting: Pharmacist

## 2023-11-27 ENCOUNTER — Other Ambulatory Visit: Payer: Self-pay

## 2023-11-27 DIAGNOSIS — Z79899 Other long term (current) drug therapy: Secondary | ICD-10-CM

## 2023-11-27 MED ORDER — GLATIRAMER ACETATE 40 MG/ML ~~LOC~~ SOSY
PREFILLED_SYRINGE | SUBCUTANEOUS | 2 refills | Status: DC
  Filled 2023-11-27: qty 12, 28d supply, fill #0
  Filled 2023-12-22: qty 12, 28d supply, fill #1
  Filled 2024-01-19 – 2024-02-01 (×2): qty 12, 28d supply, fill #2

## 2023-11-27 NOTE — Progress Notes (Signed)
   S: Patient presents today for review of their specialty medication.   Patient is currently taking glatiramer  for multiple sclerosis. Patient is managed by Dr. Vear for this.   Dosing: SubQ 40 mg 3 times per week administered at least 48 hours apart.   Adherence: confirmed  Efficacy: confirmed  Monitoring: Injection site reactions: endorses a small localized area of irritation/erythema occasionally. Nothing major at this time.  S/sx of hypersensitivity: none S/sx of infection: none Flu-like symptoms: none  Other current adverse effects: none   O:     Lab Results  Component Value Date   WBC 6.8 11/06/2023   HGB 14.0 11/06/2023   HCT 40.5 11/06/2023   MCV 88.7 11/06/2023   PLT 329.0 11/06/2023      Chemistry      Component Value Date/Time   NA 135 11/06/2023 1036   K 4.5 11/06/2023 1036   CL 101 11/06/2023 1036   CO2 25 11/06/2023 1036   BUN 16 11/06/2023 1036   CREATININE 0.79 11/06/2023 1036   CREATININE 0.66 10/23/2014 1920      Component Value Date/Time   CALCIUM 9.5 11/06/2023 1036   ALKPHOS 29 (L) 11/06/2023 1036   AST 18 11/06/2023 1036   ALT 21 11/06/2023 1036   BILITOT 0.4 11/06/2023 1036   BILITOT 0.5 04/19/2020 1207       A/P: 1. Medication review: patient currently on Copaxone  for multiple sclerosis and is tolerating it well. Reviewed the medication with the patient, including the following: Copaxone  is a medication used in the treatment of MS. Allow the syringe to stand at room temp for 20 minutes prior to injection. Rotate injection sites to lessen risk of lipoatrophy. Possible adverse effects include pain, chest pain, rash, GI upset, hypersensitivity reaction, increased risk of infection, injection site reaction, and flu-like symptoms. No recommendations for any changes.   Herlene Fleeta Morris, PharmD, JAQUELINE, CPP Clinical Pharmacist [facility] [phone]

## 2023-11-27 NOTE — Progress Notes (Signed)
 Spoke with patient need re-write from [facility] & rx has came over. New Shipping date 8/25 for 8/26 Patient aware.

## 2023-11-30 ENCOUNTER — Other Ambulatory Visit: Payer: Self-pay

## 2023-12-01 DIAGNOSIS — Z319 Encounter for procreative management, unspecified: Secondary | ICD-10-CM | POA: Diagnosis not present

## 2023-12-01 DIAGNOSIS — Z113 Encounter for screening for infections with a predominantly sexual mode of transmission: Secondary | ICD-10-CM | POA: Diagnosis not present

## 2023-12-01 DIAGNOSIS — Z3149 Encounter for other procreative investigation and testing: Secondary | ICD-10-CM | POA: Diagnosis not present

## 2023-12-01 DIAGNOSIS — E559 Vitamin D deficiency, unspecified: Secondary | ICD-10-CM | POA: Diagnosis not present

## 2023-12-02 ENCOUNTER — Other Ambulatory Visit (HOSPITAL_COMMUNITY): Payer: Self-pay

## 2023-12-02 MED ORDER — DOXYCYCLINE HYCLATE 100 MG PO CAPS
100.0000 mg | ORAL_CAPSULE | Freq: Two times a day (BID) | ORAL | 0 refills | Status: AC
  Filled 2023-12-02 – 2023-12-14 (×2): qty 40, 20d supply, fill #0

## 2023-12-08 ENCOUNTER — Other Ambulatory Visit (HOSPITAL_COMMUNITY): Payer: Self-pay

## 2023-12-08 ENCOUNTER — Other Ambulatory Visit: Payer: Self-pay

## 2023-12-12 ENCOUNTER — Other Ambulatory Visit (HOSPITAL_COMMUNITY): Payer: Self-pay

## 2023-12-13 ENCOUNTER — Other Ambulatory Visit (HOSPITAL_COMMUNITY): Payer: Self-pay

## 2023-12-14 ENCOUNTER — Other Ambulatory Visit (HOSPITAL_COMMUNITY): Payer: Self-pay

## 2023-12-16 ENCOUNTER — Other Ambulatory Visit: Payer: Self-pay

## 2023-12-22 ENCOUNTER — Other Ambulatory Visit: Payer: Self-pay | Admitting: Pharmacy Technician

## 2023-12-22 ENCOUNTER — Other Ambulatory Visit: Payer: Self-pay

## 2023-12-22 NOTE — Progress Notes (Signed)
 Specialty Pharmacy Refill Coordination Note  Penny Hansen is a 38 y.o. female contacted today regarding refills of specialty medication(s) Glatiramer  Acetate   Patient requested Delivery   Delivery date: 12/25/23   Verified address: [address]  [place]   Medication will be filled on 12/24/23.

## 2023-12-23 ENCOUNTER — Other Ambulatory Visit: Payer: Self-pay

## 2023-12-23 DIAGNOSIS — Z3183 Encounter for assisted reproductive fertility procedure cycle: Secondary | ICD-10-CM | POA: Diagnosis not present

## 2023-12-30 DIAGNOSIS — Z3183 Encounter for assisted reproductive fertility procedure cycle: Secondary | ICD-10-CM | POA: Diagnosis not present

## 2023-12-31 ENCOUNTER — Other Ambulatory Visit (HOSPITAL_COMMUNITY): Payer: Self-pay

## 2023-12-31 DIAGNOSIS — N972 Female infertility of uterine origin: Secondary | ICD-10-CM | POA: Diagnosis not present

## 2023-12-31 DIAGNOSIS — N856 Intrauterine synechiae: Secondary | ICD-10-CM | POA: Diagnosis not present

## 2023-12-31 DIAGNOSIS — N711 Chronic inflammatory disease of uterus: Secondary | ICD-10-CM | POA: Diagnosis not present

## 2023-12-31 MED ORDER — DOXYCYCLINE HYCLATE 100 MG PO TABS
200.0000 mg | ORAL_TABLET | ORAL | 0 refills | Status: AC
  Filled 2023-12-31: qty 10, 5d supply, fill #0

## 2023-12-31 MED ORDER — ESTRADIOL 2 MG PO TABS
4.0000 mg | ORAL_TABLET | Freq: Two times a day (BID) | ORAL | 0 refills | Status: AC
  Filled 2023-12-31: qty 56, 14d supply, fill #0

## 2023-12-31 MED ORDER — DIAZEPAM 5 MG PO TABS
5.0000 mg | ORAL_TABLET | Freq: Once | ORAL | 0 refills | Status: AC
  Filled 2023-12-31: qty 1, 1d supply, fill #0

## 2024-01-01 ENCOUNTER — Other Ambulatory Visit (HOSPITAL_COMMUNITY): Payer: Self-pay

## 2024-01-08 ENCOUNTER — Other Ambulatory Visit (HOSPITAL_COMMUNITY): Payer: Self-pay

## 2024-01-08 DIAGNOSIS — N856 Intrauterine synechiae: Secondary | ICD-10-CM | POA: Diagnosis not present

## 2024-01-08 MED ORDER — DIAZEPAM 5 MG PO TABS
ORAL_TABLET | ORAL | 0 refills | Status: AC
  Filled 2024-01-08 – 2024-01-20 (×2): qty 2, 1d supply, fill #0

## 2024-01-08 MED ORDER — NORETHINDRONE ACETATE 5 MG PO TABS
5.0000 mg | ORAL_TABLET | Freq: Every day | ORAL | 0 refills | Status: AC
  Filled 2024-01-08: qty 30, 30d supply, fill #0

## 2024-01-13 ENCOUNTER — Other Ambulatory Visit (HOSPITAL_COMMUNITY): Payer: Self-pay

## 2024-01-19 ENCOUNTER — Other Ambulatory Visit (HOSPITAL_COMMUNITY): Payer: Self-pay

## 2024-01-19 ENCOUNTER — Other Ambulatory Visit: Payer: Self-pay

## 2024-01-20 ENCOUNTER — Other Ambulatory Visit (HOSPITAL_COMMUNITY): Payer: Self-pay

## 2024-01-20 DIAGNOSIS — Z3141 Encounter for fertility testing: Secondary | ICD-10-CM | POA: Diagnosis not present

## 2024-01-21 ENCOUNTER — Other Ambulatory Visit: Payer: Self-pay

## 2024-02-01 ENCOUNTER — Other Ambulatory Visit: Payer: Self-pay

## 2024-02-01 ENCOUNTER — Encounter: Payer: Self-pay | Admitting: Neurology

## 2024-02-01 ENCOUNTER — Ambulatory Visit: Payer: Commercial Managed Care - PPO | Admitting: Neurology

## 2024-02-01 VITALS — BP 112/63 | HR 79 | Ht 67.0 in | Wt 152.5 lb

## 2024-02-01 DIAGNOSIS — E559 Vitamin D deficiency, unspecified: Secondary | ICD-10-CM | POA: Diagnosis not present

## 2024-02-01 DIAGNOSIS — G35A Relapsing-remitting multiple sclerosis: Secondary | ICD-10-CM | POA: Diagnosis not present

## 2024-02-01 DIAGNOSIS — Z3009 Encounter for other general counseling and advice on contraception: Secondary | ICD-10-CM

## 2024-02-01 DIAGNOSIS — R5383 Other fatigue: Secondary | ICD-10-CM | POA: Diagnosis not present

## 2024-02-01 NOTE — Progress Notes (Signed)
 [facility]  PATIENT: Penny Hansen DOB: 1985-06-28  REFERRING DOCTOR OR PCP: Comer Gaskins, NP SOURCE: Patient, notes from PCP, MRI images personally reviewed.  _________________________________   HISTORICAL  CHIEF COMPLAINT:  Chief Complaint  Patient presents with   Follow-up    Pt in room 10.alone. Here for MS follow up.    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    Update 10/27/20254:  She is on Glatopa .  She is tolerating it well.  She has not had any definite MS exacerbations.     She chose Copaxone  as she is hoping to become pregnant this year (fiance has reversed vasectomy).    She is trying to get pregnant   She is doing IVF and just had an implantation last week.   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     She is PAl-1 homozygote (4G/4G).  She will be having an embryo transplant next week.    She had an MRI of the brain September 2024 that is unchanged compared to the 2023 MRI.  We discussed getting another MRI by the end of the year to determine.  If this is occurring, I would consider one of the monoclonal antibodies if not pregnant   She is neurologically stable and is active.   She is having no difficulty with gait, balance, strength or sensation.  She goes up and downstairs without the bannister.    She keeps up with others on long walks.   Bladder function is fine.  Vision is fine.   She has fatigue especially if she works longer shift. Armodafinil  100 mg helped but due to desire to get pregnant, she has d/c  She is physically active and exercises.   Mood and cognition are doing well.  She takes Vit D supplements for deficiency.    MS history: In June 2020, she had an MRI of the brain as part of a dummy run for an MS study.  It was abnormal showing foci consistent with MS.  In retrospect, back in 2014, she had some tingling in the right flank that lasted about 3  days.   She was working long shifts so assumed it was a muscle strain.  She also has had some sensory symptoms in the right arm.  She had additional MRI of the spine 10/11/2018 that showed a normal spinal cord.  Repeat MRI of the brain was performed 05/05/2019 and showed no new lesions.  Repeat MRI of the brain 03/28/2020 showed 2 new foci, one of the periventricular splenium of the corpus callosum and another in the right frontal lobe.  She then had a lumbar puncture 04/12/2020.  It was abnormal showing oligoclonal bands and elevated IgG index. She started glatiramer  February 2022.  Imaging and other studies: MRI 09/13/2018 showed T2/FLAIR hyperintense foci in the hemispheres including the periventricular and juxtacortical white matter.  None of the foci look to be acute.  MRI brain 10/11/2018 was unchanged.  Normal enhancement pattern.  MRI of the cervical and thoracic spine 10/11/2018 showed a normal spinal cord.  MRI of the brain 05/05/2019 showed no new lesions  MRI of the brain 03/28/2020 showed 2 new foci, 1 in the periventricular splenium and another in the right frontal lobe.  These do not enhance.  MRI brain 10/23/2021 and 12/29/2022.    Echocardiogram with bubbles 09/30/2018 showed some bubbles crossing late (not c/w ASD/PFO  but could be pulmonary AVMs.   Follow up transcranial dopplers with agitated saline showed no bubbles.     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   REVIEW OF SYSTEMS: Constitutional: No fevers, chills, sweats, or change in appetite Eyes: No visual changes, double vision, eye pain Ear, nose and throat: No hearing loss, ear pain, nasal congestion, sore throat Cardiovascular: No chest pain, palpitations Respiratory:  No shortness of breath at rest or with exertion.   No wheezes GastrointestinaI: No nausea, vomiting, diarrhea, abdominal pain, fecal incontinence Genitourinary:  No  dysuria, urinary retention or frequency.  No nocturia. Musculoskeletal:  No neck pain. SI pain on left Integumentary: No rash, pruritus, skin lesions Neurological: as above Psychiatric: No depression at this time.  No anxiety Endocrine: No palpitations, diaphoresis, change in appetite, change in weigh or increased thirst Hematologic/Lymphatic:  No anemia, purpura, petechiae. Allergic/Immunologic: No itchy/runny eyes, nasal congestion, recent allergic reactions, rashes  ALLERGIES: Allergies  Allergen Reactions   Amoxicillin-Pot Clavulanate Other (See Comments)    Gi upset    HOME MEDICATIONS:  Current Outpatient Medications:    albuterol  (VENTOLIN  HFA) 108 (90 Base) MCG/ACT inhaler, Inhale 2 puffs into the lungs every 6 (six) hours as needed for wheezing or shortness of breath., Disp: 1 each, Rfl: 0   Alcohol  Swabs  (EASY TOUCH ALCOHOL  PREP MEDIUM) 70 % PADS, Use 1 swab daily to clean skin prior to progesterone  injection, Disp: 30 each, Rfl: 3   Ascorbic Acid (VITAMIN C) 100 MG tablet, Take 100 mg by mouth as directed., Disp: , Rfl:    aspirin 81 MG chewable tablet, Chew 81 mg by mouth daily., Disp: , Rfl:    Cholecalciferol (VITAMIN D -3) 125 MCG (5000 UT) TABS, , Disp: , Rfl:    estradiol  (ESTRACE ) 2 MG tablet, Take 2 tablets (4 mg total) by mouth 2 (two) times daily for 14 days, Disp: 56 tablet, Rfl: 0   estradiol  (VIVELLE -DOT) 0.1 MG/24HR patch, Place 2 patches (0.2 mg total) onto the skin every 3 (three) days.as directed., Disp: 16 patch, Rfl: 3   Glatiramer  Acetate 40 MG/ML SOSY, INJECT 40 MG UNDER THE SKIN THREE TIMES A WEEK, Disp: 12 mL, Rfl: 2   Multiple Vitamins-Minerals (MULTIVITAMIN GUMMIES WOMENS) CHEW, Chew by mouth as directed., Disp: , Rfl:    NEEDLE, DISP, 18 G 18G X 1-1/2 MISC, Use 1 needle attached to syringe to draw out progesterone  injection, Disp: 30 each, Rfl: 3   NEEDLE, DISP, 22 G 22G X 1-1/2 MISC, Use 1 needle to inject progesterone  intramuscularly, Disp: 30  each, Rfl: 3   ondansetron  (ZOFRAN  ODT) 4 MG disintegrating tablet, Take 1 tablet (4 mg total) by mouth every 8 (eight) hours as needed for nausea or vomiting., Disp: 15 tablet, Rfl: 0   Prenatal Vit-Fe Fumarate-FA (PRENATAL MULTIVITAMIN) TABS tablet, Take 1 tablet by mouth daily at 12 noon., Disp: , Rfl:    Syringe, Disposable, 3 ML MISC, use 1 syringe daily to draw up progesterone  injection, Disp: 30 each, Rfl: 3   SYRINGE-NEEDLE, DISP, 3 ML (B-D 3CC LUER-LOK SYR 18GX1-1/2) 18G X 1-1/2 3 ML MISC, Use to draw out Progesterone , Disp: 30 each, Rfl: 3   diazepam  (VALIUM ) 5 MG tablet, Take 1 tablet by mouth 1 hour prior to procedure (Patient not taking: Reported on 02/01/2024), Disp: 2 tablet, Rfl: 0   doxycycline  (VIBRA -TABS) 100 MG tablet, Take 2 tablets (200 mg total) by  mouth 30 (thirty) minutes before procedure for 1 dose. (Patient not taking: Reported on 02/01/2024), Disp: 10 tablet, Rfl: 0   doxycycline  (VIBRAMYCIN ) 100 MG capsule, Take 1 capsule (100 mg total) by mouth 2 (two) times daily. (Patient not taking: Reported on 02/01/2024), Disp: 40 capsule, Rfl: 0   filgrastim -sndz (ZARXIO ) 300 MCG/0.5ML SOSY injection, inject 331mcg/0.5mL 1-4 hours prior to FET, repeat in 1 week (Patient not taking: Reported on 02/01/2024), Disp: 1 mL, Rfl: 2   heparin 5000 UNIT/ML injection, Inject 5,000 Units into the skin 2 (two) times daily. (Patient not taking: Reported on 02/01/2024), Disp: , Rfl:    hydrOXYzine  (ATARAX ) 10 MG tablet, Take 1-2 tablets (10-20 mg total) by mouth 2 (two) times daily as needed for anxiety. (Patient not taking: Reported on 02/01/2024), Disp: 30 tablet, Rfl: 0   methylPREDNISolone  (MEDROL ) 8 MG tablet, Take 1 tablet (8 mg total) by mouth 2 (two) times daily for 4 days when instructed by MD. (Patient not taking: Reported on 02/01/2024), Disp: 8 tablet, Rfl: 3   norethindrone  (AYGESTIN ) 5 MG tablet, Take 1 tablet (5 mg total) by mouth daily. (Patient not taking: Reported on 02/01/2024),  Disp: 30 tablet, Rfl: 0   norethindrone  (AYGESTIN ) 5 MG tablet, Take 1 tablet (5 mg total) by mouth daily. (Patient not taking: Reported on 02/01/2024), Disp: 28 tablet, Rfl: 1   norethindrone  (AYGESTIN ) 5 MG tablet, Take 1 tablet (5 mg total) by mouth daily. (Patient not taking: Reported on 02/01/2024), Disp: 30 tablet, Rfl: 0   predniSONE  (DELTASONE ) 10 MG tablet, Take 10 mg by mouth daily with breakfast. (Patient not taking: Reported on 02/01/2024), Disp: , Rfl:    predniSONE  (DELTASONE ) 10 MG tablet, Take 1 tablet (10 mg total) by mouth daily as directed. (Patient not taking: Reported on 02/01/2024), Disp: 30 tablet, Rfl: 3   progesterone  (PROMETRIUM ) 200 MG capsule, Take 1 capsule (200 mg total) by mouth at bedtime. (Patient not taking: Reported on 02/01/2024), Disp: 14 capsule, Rfl: 0   progesterone  50 MG/ML injection, Inject 1 mL (50 mg total) into the muscle daily when instructed by MD. (Patient not taking: Reported on 02/01/2024), Disp: 30 mL, Rfl: 3  PAST MEDICAL HISTORY: Past Medical History:  Diagnosis Date   Asthma    GERD (gastroesophageal reflux disease)    silent GERD which has caused cough in the past   Multiple sclerosis    Numbness 09/20/2018    PAST SURGICAL HISTORY: Past Surgical History:  Procedure Laterality Date   IR EMBO VENOUS NOT HEMORR HEMANG  INC GUIDE ROADMAPPING  05/16/2021   TONSILECTOMY/ADENOIDECTOMY WITH MYRINGOTOMY  2012   WISDOM TOOTH EXTRACTION  2010    FAMILY HISTORY: Family History  Problem Relation Age of Onset   Ulcerative colitis Mother        Living   Breast cancer Mother 54   Hyperlipidemia Father        Living   Breast cancer Sister 4   Healthy Sister        x2   Breast cancer Maternal Grandmother 72       breast cancer: lumpectomy   Cancer Maternal Grandmother        Breast   Heart disease Maternal Grandfather        MI; dissecting aneurysm   Heart attack Maternal Grandfather    Hypertension Maternal Grandfather    Breast  cancer Paternal Grandmother 62       breast cancer with mastectomy, chemo and radiation   Diabetes Paternal Grandmother  Cancer Paternal Grandmother        Breast   Prostate cancer Paternal Grandfather     SOCIAL HISTORY:  Social History   Socioeconomic History   Marital status: Significant Other    Spouse name: Not on file   Number of children: 0   Years of education: Bachelors   Highest education level: Not on file  Occupational History   Occupation: [employer]  Tobacco Use   Smoking status: Never   Smokeless tobacco: Never  Vaping Use   Vaping status: Never Used  Substance and Sexual Activity   Alcohol  use: Not Currently    Comment: rare   Drug use: No   Sexual activity: Yes    Birth control/protection: Pill  Other Topics Concern   Not on file  Social History Narrative   Works as regulatory affairs officer at [employer]   Married   No children   Moved to [place] in 2007 from [place]   Enjoys relaxing.    Right handed    Caffeine  use: Coffee every day   Tea very rare   Social Drivers of Corporate Investment Banker Strain: Low Risk  (06/26/2022)   Received from [facility]   Overall Financial Resource Strain (CARDIA)    Difficulty of Paying Living Expenses: Not hard at all  Food Insecurity: No Food Insecurity (06/26/2022)   Received from [facility]   Hunger Vital Sign    Within the past 12 months, you worried that your food would run out before you got the money to buy more.: Never true    Within the past 12 months, the food you bought just didn't last and you didn't have money to get more.: Never true  Transportation Needs: No Transportation Needs (06/26/2022)   Received from [facility] - Transportation    Lack of Transportation (Medical): No    Lack of Transportation (Non-Medical): No  Physical Activity: Not on file  Stress: Not on file  Social Connections: Unknown (04/24/2022)   Received from [facility]   Social Network     Social Network: Not on file  Intimate Partner Violence: Unknown (04/24/2022)   Received from [facility]   HITS    Physically Hurt: Not on file    Insult or Talk Down To: Not on file    Threaten Physical Harm: Not on file    Scream or Curse: Not on file     PHYSICAL EXAM  Vitals:   02/01/24 1053  BP: 112/63  Pulse: 79  SpO2: 100%  Weight: 152 lb 8 oz (69.2 kg)  Height: 5' 7 (1.702 m)     Body mass index is 23.88 kg/m.   General: The patient is well-developed and well-nourished and in no acute distress.     Neurologic Exam  Mental status: The patient is alert and oriented at the time of the examination. The patient has apparent normal recent and remote memory, with an apparently normal attention span and concentration ability.   Speech is normal.  Cranial nerves: Extraocular movements are full.There is good facial sensation to soft touch bilaterally.Facial strength is normal.  Trapezius and sternocleidomastoid strength is normal. No dysarthria is noted. No obvious hearing deficits are noted.  Motor:  Muscle bulk is normal.   Muscle tone is normal.  Strength is normal in the arms and legs.  Sensory: No Phalen's or Tinel's signs.  Sensory testing is intact to pinprick, soft touch and vibration sensation in all 4 extremities.  Coordination: Cerebellar testing reveals good finger-nose-finger and heel-to-shin bilaterally.  Gait and station: Station is normal.   She has a normal gait and tandem gait.  Romberg negative   reflexes: Deep tendon reflexes are symmetric and normal bilaterally.         ASSESSMENT AND PLAN  Relapsing remitting multiple sclerosis - Plan: MR BRAIN WO CONTRAST  Other fatigue - Plan: MR BRAIN WO CONTRAST  Vitamin D  deficiency  Family planning counseling   1.   She will continue clear termer twice a week.  She is going to have an embryo transplant next week.  We will check an MRI of the brain without contrast to determine if there is any  breakthrough activity.  We discussed some family-planning issues with DMT treatments  2.   After pregnancy/delivery can go back on Nuvigil  if she would like 3.   Return to see me in 7-8 months or sooner for new or worsening neurologic symptoms.    Hugh Kamara A. Vear, MD, [facility] 02/01/2024, 11:17 AM Certified in Neurology, Clinical Neurophysiology, Sleep Medicine and Neuroimaging  [facility] [address], [address] [place], [place] [address] [phone]

## 2024-02-01 NOTE — Progress Notes (Signed)
 Specialty Pharmacy Refill Coordination Note  Penny Hansen is a 38 y.o. female contacted today regarding refills of specialty medication(s) Glatiramer  Acetate   Patient requested Delivery   Delivery date: 02/04/24   Verified address: [address]  [place]    Medication will be filled on: 02/03/24

## 2024-02-02 ENCOUNTER — Other Ambulatory Visit: Payer: Self-pay

## 2024-02-11 DIAGNOSIS — N96 Recurrent pregnancy loss: Secondary | ICD-10-CM | POA: Diagnosis not present

## 2024-02-12 DIAGNOSIS — Z319 Encounter for procreative management, unspecified: Secondary | ICD-10-CM | POA: Diagnosis not present

## 2024-02-13 ENCOUNTER — Other Ambulatory Visit (HOSPITAL_COMMUNITY): Payer: Self-pay

## 2024-02-15 ENCOUNTER — Other Ambulatory Visit (HOSPITAL_BASED_OUTPATIENT_CLINIC_OR_DEPARTMENT_OTHER): Payer: Self-pay

## 2024-02-15 ENCOUNTER — Other Ambulatory Visit: Payer: Self-pay

## 2024-02-15 ENCOUNTER — Other Ambulatory Visit (HOSPITAL_COMMUNITY): Payer: Self-pay

## 2024-02-15 MED ORDER — ESTRADIOL 2 MG PO TABS
2.0000 mg | ORAL_TABLET | Freq: Two times a day (BID) | ORAL | 3 refills | Status: AC
  Filled 2024-02-15: qty 60, 30d supply, fill #0
  Filled 2024-02-26 – 2024-03-10 (×2): qty 60, 30d supply, fill #1

## 2024-02-22 DIAGNOSIS — Z3200 Encounter for pregnancy test, result unknown: Secondary | ICD-10-CM | POA: Diagnosis not present

## 2024-02-23 ENCOUNTER — Other Ambulatory Visit: Payer: Self-pay

## 2024-02-24 DIAGNOSIS — Z3200 Encounter for pregnancy test, result unknown: Secondary | ICD-10-CM | POA: Diagnosis not present

## 2024-02-24 DIAGNOSIS — Z319 Encounter for procreative management, unspecified: Secondary | ICD-10-CM | POA: Diagnosis not present

## 2024-02-25 ENCOUNTER — Other Ambulatory Visit: Payer: Self-pay | Admitting: Neurology

## 2024-02-25 ENCOUNTER — Encounter: Payer: Self-pay | Admitting: Neurology

## 2024-02-25 ENCOUNTER — Other Ambulatory Visit: Payer: Self-pay

## 2024-02-25 ENCOUNTER — Other Ambulatory Visit (HOSPITAL_COMMUNITY): Payer: Self-pay

## 2024-02-25 ENCOUNTER — Other Ambulatory Visit: Payer: Self-pay | Admitting: Pharmacist

## 2024-02-25 DIAGNOSIS — N96 Recurrent pregnancy loss: Secondary | ICD-10-CM | POA: Diagnosis not present

## 2024-02-25 MED ORDER — GLATIRAMER ACETATE 40 MG/ML ~~LOC~~ SOSY
PREFILLED_SYRINGE | SUBCUTANEOUS | 2 refills | Status: DC
  Filled 2024-02-25: qty 12, fill #0

## 2024-02-25 MED ORDER — GLATIRAMER ACETATE 40 MG/ML ~~LOC~~ SOSY
PREFILLED_SYRINGE | SUBCUTANEOUS | 2 refills | Status: AC
  Filled 2024-02-25: qty 12, 28d supply, fill #0
  Filled 2024-03-21: qty 12, 28d supply, fill #1
  Filled 2024-04-25: qty 12, 28d supply, fill #2

## 2024-02-25 NOTE — Progress Notes (Signed)
 Specialty Pharmacy Ongoing Clinical Assessment Note  Penny Hansen is a 38 y.o. female who is being followed by the specialty pharmacy service for RxSp Multiple Sclerosis   Patient's specialty medication(s) reviewed today: Glatiramer  Acetate   Missed doses in the last 4 weeks: 0   Patient/Caregiver did not have any additional questions or concerns.   Therapeutic benefit summary: Patient is achieving benefit   Adverse events/side effects summary: No adverse events/side effects   Patient's therapy is appropriate to: Continue    Goals Addressed             This Visit's Progress    Stabilization of disease   On track    Patient is on track. Patient will maintain adherence. Patient states that she is responding well to treatment.          Follow up: 12 months  [facility]

## 2024-02-25 NOTE — Progress Notes (Signed)
 Specialty Pharmacy Refill Coordination Note  Penny Hansen is a 38 y.o. female contacted today regarding refills of specialty medication(s) Glatiramer  Acetate   Patient requested Delivery   Delivery date: 03/01/24   Verified address: [address]  [place]   Medication will be filled on: 02/29/24   This fill date is pending response to refill request from provider. Patient is aware and if they have not received fill by intended date they must follow up with pharmacy.

## 2024-02-26 ENCOUNTER — Other Ambulatory Visit (HOSPITAL_COMMUNITY): Payer: Self-pay

## 2024-02-27 ENCOUNTER — Other Ambulatory Visit (HOSPITAL_COMMUNITY): Payer: Self-pay

## 2024-02-29 ENCOUNTER — Other Ambulatory Visit: Payer: Self-pay

## 2024-03-10 ENCOUNTER — Other Ambulatory Visit: Payer: Self-pay

## 2024-03-10 DIAGNOSIS — Z3201 Encounter for pregnancy test, result positive: Secondary | ICD-10-CM | POA: Diagnosis not present

## 2024-03-10 DIAGNOSIS — N96 Recurrent pregnancy loss: Secondary | ICD-10-CM | POA: Diagnosis not present

## 2024-03-21 ENCOUNTER — Other Ambulatory Visit: Payer: Self-pay

## 2024-03-21 DIAGNOSIS — N96 Recurrent pregnancy loss: Secondary | ICD-10-CM | POA: Diagnosis not present

## 2024-03-21 DIAGNOSIS — O0901 Supervision of pregnancy with history of infertility, first trimester: Secondary | ICD-10-CM | POA: Diagnosis not present

## 2024-03-23 ENCOUNTER — Other Ambulatory Visit: Payer: Self-pay

## 2024-03-25 ENCOUNTER — Other Ambulatory Visit: Payer: Self-pay

## 2024-03-25 NOTE — Progress Notes (Signed)
 Specialty Pharmacy Refill Coordination Note  Penny Hansen is a 38 y.o. female contacted today regarding refills of specialty medication(s) Glatiramer  Acetate   Patient requested Delivery   Delivery date: 04/05/24   Verified address: [address]  [place]   Medication will be filled on: 04/04/24

## 2024-03-29 ENCOUNTER — Other Ambulatory Visit (HOSPITAL_COMMUNITY): Payer: Self-pay

## 2024-03-29 MED ORDER — PROGESTERONE 200 MG PO CAPS
200.0000 mg | ORAL_CAPSULE | Freq: Every evening | ORAL | 0 refills | Status: AC
  Filled 2024-03-29: qty 14, 14d supply, fill #0

## 2024-04-04 ENCOUNTER — Other Ambulatory Visit: Payer: Self-pay

## 2024-04-04 DIAGNOSIS — N96 Recurrent pregnancy loss: Secondary | ICD-10-CM | POA: Diagnosis not present

## 2024-04-11 ENCOUNTER — Other Ambulatory Visit (HOSPITAL_COMMUNITY): Payer: Self-pay

## 2024-04-11 MED ORDER — AZITHROMYCIN 250 MG PO TABS
ORAL_TABLET | ORAL | 0 refills | Status: DC
  Filled 2024-04-11: qty 6, 5d supply, fill #0

## 2024-04-15 ENCOUNTER — Other Ambulatory Visit (HOSPITAL_COMMUNITY): Payer: Self-pay

## 2024-04-15 MED ORDER — ALBUTEROL SULFATE HFA 108 (90 BASE) MCG/ACT IN AERS
INHALATION_SPRAY | RESPIRATORY_TRACT | 1 refills | Status: AC
  Filled 2024-04-15: qty 6.7, 20d supply, fill #0

## 2024-04-16 ENCOUNTER — Other Ambulatory Visit (HOSPITAL_COMMUNITY): Payer: Self-pay

## 2024-04-16 MED ORDER — ENOXAPARIN SODIUM 40 MG/0.4ML IJ SOSY
40.0000 mg | PREFILLED_SYRINGE | Freq: Every evening | INTRAMUSCULAR | 10 refills | Status: AC
  Filled 2024-04-16: qty 12, 30d supply, fill #0

## 2024-04-20 ENCOUNTER — Other Ambulatory Visit (HOSPITAL_COMMUNITY): Payer: Self-pay

## 2024-04-25 ENCOUNTER — Other Ambulatory Visit: Payer: Self-pay

## 2024-04-26 ENCOUNTER — Other Ambulatory Visit: Payer: Self-pay

## 2024-04-26 NOTE — Progress Notes (Signed)
 Specialty Pharmacy Refill Coordination Note  Penny Hansen is a 39 y.o. female contacted today regarding refills of specialty medication(s) Glatiramer  Acetate   Patient requested (Patient-Rptd) Delivery   Delivery date: 04/29/24   Verified address: (Patient-Rptd) [address] [place] [address]   Medication will be filled on: 04/28/24

## 2024-04-28 ENCOUNTER — Other Ambulatory Visit: Payer: Self-pay

## 2024-09-05 ENCOUNTER — Ambulatory Visit: Admitting: Neurology
